# Patient Record
Sex: Female | Born: 1935
Health system: Southern US, Community
[De-identification: ages and names within clinical notes are randomized; demographics above are authoritative.]

## PROBLEM LIST (undated history)

## (undated) DIAGNOSIS — E039 Hypothyroidism, unspecified: Secondary | ICD-10-CM

## (undated) DIAGNOSIS — D649 Anemia, unspecified: Secondary | ICD-10-CM

## (undated) DIAGNOSIS — E7800 Pure hypercholesterolemia, unspecified: Secondary | ICD-10-CM

## (undated) DIAGNOSIS — I10 Essential (primary) hypertension: Secondary | ICD-10-CM

## (undated) DIAGNOSIS — K219 Gastro-esophageal reflux disease without esophagitis: Secondary | ICD-10-CM

## (undated) DIAGNOSIS — M1990 Unspecified osteoarthritis, unspecified site: Secondary | ICD-10-CM

## (undated) HISTORY — DX: Pure hypercholesterolemia, unspecified: E78.00

## (undated) HISTORY — DX: Gastro-esophageal reflux disease without esophagitis: K21.9

## (undated) HISTORY — PX: ABDOMINAL HYSTERECTOMY: SHX81

## (undated) HISTORY — DX: Essential (primary) hypertension: I10

## (undated) HISTORY — DX: Hypothyroidism, unspecified: E03.9

## (undated) HISTORY — DX: Anemia, unspecified: D64.9

---

## 2005-07-23 ENCOUNTER — Other Ambulatory Visit: Payer: Self-pay

## 2005-07-23 ENCOUNTER — Emergency Department: Payer: Self-pay | Admitting: Emergency Medicine

## 2006-03-27 ENCOUNTER — Ambulatory Visit: Payer: Self-pay | Admitting: Gastroenterology

## 2008-04-29 ENCOUNTER — Ambulatory Visit: Payer: Self-pay | Admitting: Family Medicine

## 2008-04-30 ENCOUNTER — Ambulatory Visit: Payer: Self-pay | Admitting: Unknown Physician Specialty

## 2008-04-30 ENCOUNTER — Other Ambulatory Visit: Payer: Self-pay

## 2008-05-02 ENCOUNTER — Ambulatory Visit: Payer: Self-pay | Admitting: Unknown Physician Specialty

## 2010-04-03 HISTORY — PX: LAPAROSCOPIC CHOLECYSTECTOMY: SUR755

## 2010-04-18 ENCOUNTER — Inpatient Hospital Stay: Payer: Self-pay | Admitting: Surgery

## 2010-05-26 ENCOUNTER — Ambulatory Visit: Payer: Self-pay | Admitting: Urology

## 2010-06-03 ENCOUNTER — Ambulatory Visit: Payer: Self-pay | Admitting: Oncology

## 2010-06-10 ENCOUNTER — Ambulatory Visit: Payer: Self-pay | Admitting: Oncology

## 2010-06-14 ENCOUNTER — Ambulatory Visit: Payer: Self-pay | Admitting: Oncology

## 2010-07-04 ENCOUNTER — Ambulatory Visit: Payer: Self-pay | Admitting: Oncology

## 2010-07-05 ENCOUNTER — Ambulatory Visit: Payer: Self-pay | Admitting: Surgery

## 2010-07-12 ENCOUNTER — Ambulatory Visit: Payer: Self-pay | Admitting: Surgery

## 2010-07-22 LAB — PATHOLOGY REPORT

## 2010-07-28 ENCOUNTER — Ambulatory Visit: Payer: Self-pay | Admitting: Oncology

## 2010-08-04 ENCOUNTER — Ambulatory Visit: Payer: Self-pay | Admitting: Oncology

## 2010-09-03 ENCOUNTER — Ambulatory Visit: Payer: Self-pay | Admitting: Oncology

## 2010-09-14 ENCOUNTER — Ambulatory Visit: Payer: Self-pay | Admitting: Oncology

## 2010-09-21 ENCOUNTER — Ambulatory Visit: Payer: Self-pay | Admitting: Oncology

## 2010-10-04 ENCOUNTER — Ambulatory Visit: Payer: Self-pay | Admitting: Oncology

## 2011-03-29 ENCOUNTER — Ambulatory Visit: Payer: Self-pay | Admitting: Oncology

## 2011-04-04 ENCOUNTER — Ambulatory Visit: Payer: Self-pay | Admitting: Oncology

## 2011-08-02 ENCOUNTER — Ambulatory Visit: Payer: Self-pay | Admitting: Oncology

## 2011-08-09 ENCOUNTER — Ambulatory Visit: Payer: Self-pay | Admitting: Oncology

## 2011-09-04 ENCOUNTER — Ambulatory Visit: Payer: Self-pay | Admitting: Oncology

## 2011-12-27 DIAGNOSIS — R7989 Other specified abnormal findings of blood chemistry: Secondary | ICD-10-CM | POA: Diagnosis not present

## 2011-12-27 DIAGNOSIS — R5383 Other fatigue: Secondary | ICD-10-CM | POA: Diagnosis not present

## 2011-12-27 DIAGNOSIS — E039 Hypothyroidism, unspecified: Secondary | ICD-10-CM | POA: Diagnosis not present

## 2011-12-27 DIAGNOSIS — R5381 Other malaise: Secondary | ICD-10-CM | POA: Diagnosis not present

## 2011-12-27 DIAGNOSIS — E7800 Pure hypercholesterolemia, unspecified: Secondary | ICD-10-CM | POA: Diagnosis not present

## 2012-01-03 DIAGNOSIS — E7800 Pure hypercholesterolemia, unspecified: Secondary | ICD-10-CM | POA: Diagnosis not present

## 2012-01-03 DIAGNOSIS — M79609 Pain in unspecified limb: Secondary | ICD-10-CM | POA: Diagnosis not present

## 2012-01-03 DIAGNOSIS — I10 Essential (primary) hypertension: Secondary | ICD-10-CM | POA: Diagnosis not present

## 2012-01-03 DIAGNOSIS — M549 Dorsalgia, unspecified: Secondary | ICD-10-CM | POA: Diagnosis not present

## 2012-06-25 DIAGNOSIS — I10 Essential (primary) hypertension: Secondary | ICD-10-CM | POA: Diagnosis not present

## 2012-06-25 DIAGNOSIS — E7800 Pure hypercholesterolemia, unspecified: Secondary | ICD-10-CM | POA: Diagnosis not present

## 2012-08-09 ENCOUNTER — Emergency Department: Payer: Self-pay | Admitting: Emergency Medicine

## 2012-08-09 DIAGNOSIS — Z79899 Other long term (current) drug therapy: Secondary | ICD-10-CM | POA: Diagnosis not present

## 2012-08-09 DIAGNOSIS — K219 Gastro-esophageal reflux disease without esophagitis: Secondary | ICD-10-CM | POA: Diagnosis not present

## 2012-08-09 DIAGNOSIS — E039 Hypothyroidism, unspecified: Secondary | ICD-10-CM | POA: Diagnosis not present

## 2012-08-09 DIAGNOSIS — I10 Essential (primary) hypertension: Secondary | ICD-10-CM | POA: Diagnosis not present

## 2012-08-09 DIAGNOSIS — M79609 Pain in unspecified limb: Secondary | ICD-10-CM | POA: Diagnosis not present

## 2012-08-09 DIAGNOSIS — M7120 Synovial cyst of popliteal space [Baker], unspecified knee: Secondary | ICD-10-CM | POA: Diagnosis not present

## 2012-08-09 DIAGNOSIS — E785 Hyperlipidemia, unspecified: Secondary | ICD-10-CM | POA: Diagnosis not present

## 2012-08-09 LAB — BASIC METABOLIC PANEL
Anion Gap: 6 — ABNORMAL LOW (ref 7–16)
BUN: 14 mg/dL (ref 7–18)
Co2: 30 mmol/L (ref 21–32)
Creatinine: 0.87 mg/dL (ref 0.60–1.30)
EGFR (African American): >60
EGFR (Non-African Amer.): >60
Glucose: 88 mg/dL (ref 65–99)
Sodium: 140 mmol/L (ref 136–145)

## 2012-08-19 DIAGNOSIS — I10 Essential (primary) hypertension: Secondary | ICD-10-CM | POA: Diagnosis not present

## 2012-08-19 DIAGNOSIS — M1710 Unilateral primary osteoarthritis, unspecified knee: Secondary | ICD-10-CM | POA: Diagnosis not present

## 2012-10-01 ENCOUNTER — Telehealth: Payer: Self-pay | Admitting: Internal Medicine

## 2012-10-01 NOTE — Telephone Encounter (Signed)
I can try to work her in somewhere, but if she is having acute worsening pain - rec to acute care or i can get her referred to Dr Lavenia Atlas (arthritis specialist at [facility]) - to evaluate and then can follow up with me.  Thanks.

## 2012-10-01 NOTE — Telephone Encounter (Signed)
Pt is calling concerning her arhtritis in her hands and back. She is having some really bad pain. I set her up for February but she was wanting to come in as soon as possible.

## 2012-10-02 NOTE — Telephone Encounter (Signed)
Called patient at home. Patient states that she does not want to see Dr. Gavin Potters at this time, she had injection a month ago from him and it did not last long. She wants to see you, if you can fit her in.

## 2012-10-02 NOTE — Telephone Encounter (Signed)
See if she can come in at 12:00 on 10/10/12.  Thanks.

## 2012-10-09 ENCOUNTER — Encounter: Payer: Self-pay | Admitting: *Deleted

## 2012-10-10 ENCOUNTER — Ambulatory Visit (INDEPENDENT_AMBULATORY_CARE_PROVIDER_SITE_OTHER): Payer: Medicare Other | Admitting: Internal Medicine

## 2012-10-10 ENCOUNTER — Encounter: Payer: Self-pay | Admitting: Internal Medicine

## 2012-10-10 VITALS — BP 130/57 | HR 63 | Temp 98.1°F | Ht 63.0 in | Wt 156.0 lb

## 2012-10-10 DIAGNOSIS — E039 Hypothyroidism, unspecified: Secondary | ICD-10-CM

## 2012-10-10 DIAGNOSIS — I10 Essential (primary) hypertension: Secondary | ICD-10-CM | POA: Diagnosis not present

## 2012-10-10 DIAGNOSIS — K219 Gastro-esophageal reflux disease without esophagitis: Secondary | ICD-10-CM

## 2012-10-10 DIAGNOSIS — M25569 Pain in unspecified knee: Secondary | ICD-10-CM | POA: Diagnosis not present

## 2012-10-10 DIAGNOSIS — N2889 Other specified disorders of kidney and ureter: Secondary | ICD-10-CM

## 2012-10-10 DIAGNOSIS — E7800 Pure hypercholesterolemia, unspecified: Secondary | ICD-10-CM

## 2012-10-10 DIAGNOSIS — N289 Disorder of kidney and ureter, unspecified: Secondary | ICD-10-CM

## 2012-10-10 DIAGNOSIS — R300 Dysuria: Secondary | ICD-10-CM

## 2012-10-10 DIAGNOSIS — R5383 Other fatigue: Secondary | ICD-10-CM

## 2012-10-10 LAB — POCT URINALYSIS DIPSTICK
Bilirubin, UA: NEGATIVE
Blood, UA: NEGATIVE
Glucose, UA: NEGATIVE
Ketones, UA: NEGATIVE
pH, UA: 5.5

## 2012-10-10 MED ORDER — NYSTATIN 100000 UNIT/GM EX CREA: TOPICAL_CREAM | Freq: Two times a day (BID) | CUTANEOUS | Status: DC

## 2012-10-10 MED ORDER — LEVOTHYROXINE SODIUM 75 MCG PO TABS: 75.0000 ug | ORAL_TABLET | Freq: Every day | ORAL | Status: DC

## 2012-10-10 MED ORDER — PANTOPRAZOLE SODIUM 40 MG PO TBEC: 40.0000 mg | DELAYED_RELEASE_TABLET | Freq: Every day | ORAL | Status: DC

## 2012-10-10 NOTE — Patient Instructions (Addendum)
It was good seeing you today.  I am sorry you have not been feeling well.  We will get an appt with orthopedics.  Also, we will schedule follow up labs.

## 2012-10-11 ENCOUNTER — Telehealth: Payer: Self-pay | Admitting: *Deleted

## 2012-10-11 ENCOUNTER — Other Ambulatory Visit: Payer: Self-pay | Admitting: Internal Medicine

## 2012-10-11 DIAGNOSIS — N390 Urinary tract infection, site not specified: Secondary | ICD-10-CM

## 2012-10-11 NOTE — Telephone Encounter (Signed)
Called in Script for [employer]

## 2012-10-24 ENCOUNTER — Other Ambulatory Visit (INDEPENDENT_AMBULATORY_CARE_PROVIDER_SITE_OTHER): Payer: Medicare Other

## 2012-10-24 DIAGNOSIS — E039 Hypothyroidism, unspecified: Secondary | ICD-10-CM

## 2012-10-24 DIAGNOSIS — I10 Essential (primary) hypertension: Secondary | ICD-10-CM | POA: Diagnosis not present

## 2012-10-24 DIAGNOSIS — E7800 Pure hypercholesterolemia, unspecified: Secondary | ICD-10-CM

## 2012-10-24 DIAGNOSIS — R5383 Other fatigue: Secondary | ICD-10-CM

## 2012-10-24 DIAGNOSIS — N390 Urinary tract infection, site not specified: Secondary | ICD-10-CM

## 2012-10-24 DIAGNOSIS — R5381 Other malaise: Secondary | ICD-10-CM | POA: Diagnosis not present

## 2012-10-24 LAB — CBC WITH DIFFERENTIAL/PLATELET
Basophils Relative: 0.8 % (ref 0.0–3.0)
Eosinophils Relative: 1.9 % (ref 0.0–5.0)
HCT: 37.5 % (ref 36.0–46.0)
Lymphs Abs: 1.4 10*3/uL (ref 0.7–4.0)
MCV: 94.1 fl (ref 78.0–100.0)
Monocytes Absolute: 0.6 10*3/uL (ref 0.1–1.0)
Neutro Abs: 4.5 10*3/uL (ref 1.4–7.7)
RBC: 3.98 Mil/uL (ref 3.87–5.11)
WBC: 6.6 10*3/uL (ref 4.5–10.5)

## 2012-10-24 LAB — URINALYSIS, ROUTINE W REFLEX MICROSCOPIC
Hgb urine dipstick: NEGATIVE
Nitrite: NEGATIVE
Specific Gravity, Urine: 1.015 (ref 1.000–1.030)
Total Protein, Urine: NEGATIVE
pH: 7 (ref 5.0–8.0)

## 2012-10-24 LAB — BASIC METABOLIC PANEL
BUN: 13 mg/dL (ref 6–23)
Calcium: 9 mg/dL (ref 8.4–10.5)
GFR: 85.13 mL/min (ref >60.00)
Glucose, Bld: 103 mg/dL — ABNORMAL HIGH (ref 70–99)
Sodium: 138 mEq/L (ref 135–145)

## 2012-10-24 LAB — TSH: TSH: 4.05 u[IU]/mL (ref 0.35–5.50)

## 2012-10-27 ENCOUNTER — Encounter: Payer: Self-pay | Admitting: Internal Medicine

## 2012-10-27 DIAGNOSIS — I10 Essential (primary) hypertension: Secondary | ICD-10-CM | POA: Insufficient documentation

## 2012-10-27 DIAGNOSIS — E7800 Pure hypercholesterolemia, unspecified: Secondary | ICD-10-CM | POA: Insufficient documentation

## 2012-10-27 DIAGNOSIS — N2889 Other specified disorders of kidney and ureter: Secondary | ICD-10-CM | POA: Insufficient documentation

## 2012-10-27 DIAGNOSIS — K219 Gastro-esophageal reflux disease without esophagitis: Secondary | ICD-10-CM | POA: Insufficient documentation

## 2012-10-27 DIAGNOSIS — E039 Hypothyroidism, unspecified: Secondary | ICD-10-CM | POA: Insufficient documentation

## 2012-10-27 DIAGNOSIS — N399 Disorder of urinary system, unspecified: Secondary | ICD-10-CM | POA: Insufficient documentation

## 2012-10-27 NOTE — Progress Notes (Signed)
Subjective:    Patient ID: Tasha Avila, female    DOB: 12-02-1928, 78 y.o.   MRN: [id number]  HPI 78 year old female with past history of hypertension, anemia, hypercholesterolemia and hypothyroidism who is s/p lap cholecystectomy 5/11.  A renal mass was found incedentally on the ultrasound.  Subsequent CT revealed increased lymphadenopathy.  She underwent bx -negative.  Sill concern regarding possible lymphoma.  She comes in today for a scheduled follow up.  She states she went to the ER for knee/leg pain.  Ultrasound revealed a Baker's Cyst.  Takes tylenol intermittently.  No increased erythema or warmth.  States she is eating and drinking relatively well.  Still with some occasional acid reflux, but is better.  Still with increased pain in her back and shoulder.    Past Medical History  Diagnosis Date  . Anemia   . Hypothyroidism   . Hypercholesterolemia   . Hypertension   . GERD (gastroesophageal reflux disease)     Outpatient Encounter Prescriptions as of 10/10/2012  Medication Sig Dispense Refill  . levothyroxine (SYNTHROID, LEVOTHROID) 75 MCG tablet Take 1 tablet (75 mcg total) by mouth daily.  90 tablet  3  . lisinopril-hydrochlorothiazide (PRINZIDE,ZESTORETIC) 10-12.5 MG per tablet Take 1 tablet by mouth daily.      . nitrofurantoin, macrocrystal-monohydrate, (MACROBID) 100 MG capsule Take 100 mg by mouth 2 (two) times daily.      Marland Kitchen nystatin cream (MYCOSTATIN) Apply topically 2 (two) times daily.  30 g  0  . pantoprazole (PROTONIX) 40 MG tablet Take 1 tablet (40 mg total) by mouth daily.  30 tablet  3  . [DISCONTINUED] levothyroxine (SYNTHROID, LEVOTHROID) 75 MCG tablet Take 75 mcg by mouth daily.      . [DISCONTINUED] omeprazole (PRILOSEC) 20 MG capsule Take 20 mg by mouth daily.        Review of Systems Patient denies any headache.  No significant sinus symptoms currently.   No chest pain, tightness or palpitations.  No increased shortness of breath, cough or congestion.   Breathing stable.  No nausea or vomiting.  No abdominal pain or cramping.  Bowels stable.  She is concerned she may have a uti.  Some increased frequency and mild dysuria.      Objective:   Physical Exam Filed Vitals:   10/10/12 1202  BP: 130/57  Pulse: 63  Temp: 98.1 F (40.61 C)   78 year old female in no acute distress.   HEENT:  Nares - clear.  OP- without lesions or erythema.  NECK:  Supple, nontender.   HEART:  Appears to be regular. LUNGS:  Without crackles or wheezing audible.  Respirations even and unlabored.   RADIAL PULSE:  Equal bilaterally.  ABDOMEN:  Soft, nontender.  No audible abdominal bruit.   EXTREMITIES:  No increased edema to be present.  Minimal tenderness to palpation over the popliteal region.  No increased erythema or warmth.                     Assessment & Plan:  KNEE PAIN/LEG PAIN.  Previous ultrasound revealed a Bakers cyst.  Given persistent problems, will refer to ortho for evaluation and further treatment.  Continue tylenol prn.   MSK.  Has persistent back pain.  Declines any further w/up.  Follow.  Tylenol prn.   POSSIBLE UTI.  Urine dip consistent with a uti.  Treat with macrobid as directed.    DERMATOLOGY.  Persistent perirectal and perivaginal irritation.  Nystatin cream as  directed.  Follow.   HEALTH MAINTENANCE.  Physical 01/03/12.  She is s/p hysterectomy.  Declines further GI evaluatoin., bone density and mammogram.

## 2012-10-27 NOTE — Assessment & Plan Note (Signed)
Found incidentally.  Evaluated by Urology and Dr Choksi.  CT revealed lymphadenopathy.  Continue follow up with Dr Choksi.    

## 2012-10-27 NOTE — Assessment & Plan Note (Signed)
Has been a persistent problem for her.  Has improved.  Has seen GI.  Not on Reglan now.  Declines further evaluation.

## 2012-10-27 NOTE — Assessment & Plan Note (Signed)
Low cholesterol diet.  Will follow.  Off simvastatin.  Check lipid panel with next labs.

## 2012-10-27 NOTE — Assessment & Plan Note (Signed)
On thyroid replacement.  Follow tsh.  

## 2012-10-27 NOTE — Assessment & Plan Note (Signed)
Blood pressure has been under good control.  Same meds.  Check metabolic panel with next labs.    

## 2012-10-28 ENCOUNTER — Telehealth: Payer: Self-pay | Admitting: *Deleted

## 2012-10-28 ENCOUNTER — Other Ambulatory Visit: Payer: Self-pay | Admitting: Internal Medicine

## 2012-10-28 ENCOUNTER — Encounter: Payer: Self-pay | Admitting: *Deleted

## 2012-10-28 NOTE — Telephone Encounter (Signed)
Patient informed of positive test results.Cipro 500 mg bid for 5 days left on vm @ [facility] in [place]. Patient has NKDA

## 2012-10-28 NOTE — Progress Notes (Signed)
Opened in error

## 2012-12-02 ENCOUNTER — Other Ambulatory Visit: Payer: Self-pay | Admitting: *Deleted

## 2012-12-02 ENCOUNTER — Telehealth: Payer: Self-pay | Admitting: Internal Medicine

## 2012-12-02 MED ORDER — LISINOPRIL-HYDROCHLOROTHIAZIDE 10-12.5 MG PO TABS: 1.0000 | ORAL_TABLET | Freq: Every day | ORAL | Status: DC

## 2012-12-02 NOTE — Telephone Encounter (Signed)
Refilled patients script for lisinopril/Hctz 10-12.5 mg

## 2012-12-02 NOTE — Telephone Encounter (Signed)
Lisino -HCTZ 10-12.5  Mg tab  Take one tablet by mouth every day  # 30

## 2012-12-12 DIAGNOSIS — M1710 Unilateral primary osteoarthritis, unspecified knee: Secondary | ICD-10-CM | POA: Diagnosis not present

## 2012-12-31 DIAGNOSIS — M1710 Unilateral primary osteoarthritis, unspecified knee: Secondary | ICD-10-CM | POA: Diagnosis not present

## 2013-01-07 DIAGNOSIS — M1710 Unilateral primary osteoarthritis, unspecified knee: Secondary | ICD-10-CM | POA: Diagnosis not present

## 2013-01-10 ENCOUNTER — Encounter: Payer: Self-pay | Admitting: Internal Medicine

## 2013-01-10 ENCOUNTER — Ambulatory Visit (INDEPENDENT_AMBULATORY_CARE_PROVIDER_SITE_OTHER): Payer: Medicare Other | Admitting: Internal Medicine

## 2013-01-10 VITALS — BP 112/60 | HR 59 | Temp 97.7°F | Ht 63.0 in | Wt 156.0 lb

## 2013-01-10 DIAGNOSIS — I10 Essential (primary) hypertension: Secondary | ICD-10-CM | POA: Diagnosis not present

## 2013-01-10 DIAGNOSIS — N289 Disorder of kidney and ureter, unspecified: Secondary | ICD-10-CM

## 2013-01-10 DIAGNOSIS — R220 Localized swelling, mass and lump, head: Secondary | ICD-10-CM

## 2013-01-10 DIAGNOSIS — K219 Gastro-esophageal reflux disease without esophagitis: Secondary | ICD-10-CM

## 2013-01-10 DIAGNOSIS — E7800 Pure hypercholesterolemia, unspecified: Secondary | ICD-10-CM | POA: Diagnosis not present

## 2013-01-10 DIAGNOSIS — N2889 Other specified disorders of kidney and ureter: Secondary | ICD-10-CM

## 2013-01-10 DIAGNOSIS — E039 Hypothyroidism, unspecified: Secondary | ICD-10-CM | POA: Diagnosis not present

## 2013-01-10 DIAGNOSIS — R221 Localized swelling, mass and lump, neck: Secondary | ICD-10-CM

## 2013-01-12 ENCOUNTER — Encounter: Payer: Self-pay | Admitting: Internal Medicine

## 2013-01-12 NOTE — Assessment & Plan Note (Signed)
Some intermittent break through symptoms.  Declines any further evaluation and w/up.  Follow.

## 2013-01-12 NOTE — Assessment & Plan Note (Signed)
See above.  Incidentally found.  Concern regarding lymphoma.  Pt states everything checked out fine.  Obtain records.

## 2013-01-12 NOTE — Progress Notes (Signed)
Subjective:    Patient ID: Tasha Avila, female    DOB: 1928-09-02, 79 y.o.   MRN: [id number]  HPI 79 year old female with past history of hypertension, anemia, hypercholesterolemia and hypothyroidism who is s/p lap cholecystectomy 5/11.  A renal mass was found incedentally on the ultrasound.  Subsequent CT revealed increased lymphadenopathy.  She underwent bx -negative.  Sill concern regarding possible lymphoma.  She comes in today to follow up on these issues as well as for a complete physical exam.  She saw Dr Ernest Pine for her leg.  Diagnosed with arthritis.  Uses a topical medication - which helps some.  No chest pain or tightness.  Breathing stable.  No nausea or vomiting. No bowel change.    Past Medical History  Diagnosis Date  . Anemia   . Hypothyroidism   . Hypercholesterolemia   . Hypertension   . GERD (gastroesophageal reflux disease)     Outpatient Encounter Prescriptions as of 01/10/2013  Medication Sig Dispense Refill  . Homeopathic Products ([employer] LEG CRAMPS PAIN RELIEF PO) Take by mouth as needed.      Marland Kitchen levothyroxine (SYNTHROID, LEVOTHROID) 75 MCG tablet Take 1 tablet (75 mcg total) by mouth daily.  90 tablet  3  . lisinopril-hydrochlorothiazide (PRINZIDE,ZESTORETIC) 10-12.5 MG per tablet Take 1 tablet by mouth daily.  60 tablet  1  . nystatin cream (MYCOSTATIN) Apply topically 2 (two) times daily.  30 g  0  . omeprazole (PRILOSEC) 20 MG capsule Take 20 mg by mouth daily.      . pantoprazole (PROTONIX) 40 MG tablet Take 1 tablet (40 mg total) by mouth daily.  30 tablet  3  . nitrofurantoin, macrocrystal-monohydrate, (MACROBID) 100 MG capsule Take 100 mg by mouth 2 (two) times daily.       No facility-administered encounter medications on file as of 01/10/2013.    Review of Systems Patient denies any headache.  No significant sinus symptoms currently.   No chest pain, tightness or palpitations.  No increased shortness of breath, cough or congestion.  Breathing stable.  No  nausea or vomiting.  No abdominal pain or cramping.  Bowels stable.  No urinary problems now. Some persistent leg discomfort.  The topical medication - helps.        Objective:   Physical Exam  Filed Vitals:   01/10/13 1444  BP: 112/60  Pulse: 59  Temp: 97.7 F (34.75 C)   79 year old female in no acute distress.   HEENT:  Nares- clear.  Oropharynx - without lesions. NECK:  Supple.  Nontender.  No audible bruit.  Palpable right neck nodule.  HEART:  Appears to be regular. LUNGS:  No crackles or wheezing audible.  Respirations even and unlabored.  RADIAL PULSE:  Equal bilaterally.    BREASTS:  No nipple discharge or nipple retraction present.  Could not appreciate any distinct nodules or axillary adenopathy.  ABDOMEN:  Soft, nontender.  Bowel sounds present and normal.  No audible abdominal bruit.  GU:  Normal external genitalia.  Vaginal vault without lesions.  S/P hysterectomy.  Could not appreciate any adnexal masses or tenderness.   RECTAL:  Heme negative.   EXTREMITIES:  No increased edema present.  DP pulses palpable and equal bilaterally.             Assessment & Plan:  KNEE PAIN/LEG PAIN.  Previous ultrasound revealed a Bakers cyst.  Given persistent problems was referred to Dr Ernest Pine.  Saw Dr Ernest Pine.  Diagnosed with arthritis.  Stable.   Follow.   NECK NODULE.  Will refer to ENT for evaluation.  Has the history of the renal mass.  Concern regarding lymphoma - see above.  Refer to ENT for evaluation.    MSK.  Has persistent back pain.  Declines any further w/up.  Follow.  Tylenol prn.    DERMATOLOGY.  Persistent intermittent perirectal and perivaginal irritation.  Nystatin cream as needed.  Improved currently.     HEALTH MAINTENANCE.  Physical today.  She is s/p hysterectomy.  Declines further GI evaluatoin., bone density and mammogram.

## 2013-01-12 NOTE — Assessment & Plan Note (Signed)
Blood pressure is under good control. Same medication regimen.  Check metabolic panel.  

## 2013-01-12 NOTE — Assessment & Plan Note (Signed)
Low cholesterol diet.  Check lipid panel with next fasting labs.  

## 2013-01-12 NOTE — Assessment & Plan Note (Signed)
On replacement.  Check tsh.    

## 2013-01-14 DIAGNOSIS — M1710 Unilateral primary osteoarthritis, unspecified knee: Secondary | ICD-10-CM | POA: Diagnosis not present

## 2013-01-14 DIAGNOSIS — R221 Localized swelling, mass and lump, neck: Secondary | ICD-10-CM | POA: Diagnosis not present

## 2013-01-14 DIAGNOSIS — H6120 Impacted cerumen, unspecified ear: Secondary | ICD-10-CM | POA: Diagnosis not present

## 2013-01-20 DIAGNOSIS — R221 Localized swelling, mass and lump, neck: Secondary | ICD-10-CM | POA: Diagnosis not present

## 2013-01-20 DIAGNOSIS — R599 Enlarged lymph nodes, unspecified: Secondary | ICD-10-CM | POA: Diagnosis not present

## 2013-01-20 DIAGNOSIS — C8589 Other specified types of non-Hodgkin lymphoma, extranodal and solid organ sites: Secondary | ICD-10-CM | POA: Diagnosis not present

## 2013-01-20 DIAGNOSIS — R220 Localized swelling, mass and lump, head: Secondary | ICD-10-CM | POA: Diagnosis not present

## 2013-01-30 ENCOUNTER — Ambulatory Visit: Payer: Self-pay | Admitting: Oncology

## 2013-02-01 ENCOUNTER — Telehealth: Payer: Self-pay | Admitting: Internal Medicine

## 2013-02-01 MED ORDER — LISINOPRIL-HYDROCHLOROTHIAZIDE 10-12.5 MG PO TABS: 1.0000 | ORAL_TABLET | Freq: Every day | ORAL | Status: DC

## 2013-02-01 NOTE — Telephone Encounter (Signed)
Refilled lisinopril/hctz 10/12.5 (#60 with 3 refills)

## 2013-02-10 ENCOUNTER — Telehealth: Payer: Self-pay | Admitting: Internal Medicine

## 2013-02-10 MED ORDER — PANTOPRAZOLE SODIUM 40 MG PO TBEC: 40.0000 mg | DELAYED_RELEASE_TABLET | Freq: Every day | ORAL | Status: DC

## 2013-02-10 NOTE — Telephone Encounter (Signed)
Refilled protonix x 6

## 2013-02-14 ENCOUNTER — Ambulatory Visit: Payer: Self-pay | Admitting: Oncology

## 2013-02-14 DIAGNOSIS — R5381 Other malaise: Secondary | ICD-10-CM | POA: Diagnosis not present

## 2013-02-14 DIAGNOSIS — I10 Essential (primary) hypertension: Secondary | ICD-10-CM | POA: Diagnosis not present

## 2013-02-14 DIAGNOSIS — C8311 Mantle cell lymphoma, lymph nodes of head, face, and neck: Secondary | ICD-10-CM | POA: Diagnosis not present

## 2013-02-14 DIAGNOSIS — E039 Hypothyroidism, unspecified: Secondary | ICD-10-CM | POA: Diagnosis not present

## 2013-02-14 DIAGNOSIS — K219 Gastro-esophageal reflux disease without esophagitis: Secondary | ICD-10-CM | POA: Diagnosis not present

## 2013-02-14 DIAGNOSIS — E785 Hyperlipidemia, unspecified: Secondary | ICD-10-CM | POA: Diagnosis not present

## 2013-02-14 DIAGNOSIS — H9190 Unspecified hearing loss, unspecified ear: Secondary | ICD-10-CM | POA: Diagnosis not present

## 2013-02-14 DIAGNOSIS — G510 Bell's palsy: Secondary | ICD-10-CM | POA: Diagnosis not present

## 2013-02-14 DIAGNOSIS — M549 Dorsalgia, unspecified: Secondary | ICD-10-CM | POA: Diagnosis not present

## 2013-02-14 DIAGNOSIS — G8929 Other chronic pain: Secondary | ICD-10-CM | POA: Diagnosis not present

## 2013-02-14 LAB — COMPREHENSIVE METABOLIC PANEL
Albumin: 4 g/dL (ref 3.4–5.0)
Alkaline Phosphatase: 65 U/L (ref 50–136)
Anion Gap: 8 (ref 7–16)
BUN: 11 mg/dL (ref 7–18)
Bilirubin,Total: 0.3 mg/dL (ref 0.2–1.0)
Calcium, Total: 9.1 mg/dL (ref 8.5–10.1)
Chloride: 102 mmol/L (ref 98–107)
Co2: 30 mmol/L (ref 21–32)
Creatinine: 0.96 mg/dL (ref 0.60–1.30)
EGFR (African American): >60
EGFR (Non-African Amer.): 55 — ABNORMAL LOW
Glucose: 108 mg/dL — ABNORMAL HIGH (ref 65–99)
Osmolality: 279 (ref 275–301)
Potassium: 4 mmol/L (ref 3.5–5.1)
SGOT(AST): 21 U/L (ref 15–37)
SGPT (ALT): 26 U/L (ref 12–78)
Sodium: 140 mmol/L (ref 136–145)
Total Protein: 7.6 g/dL (ref 6.4–8.2)

## 2013-02-14 LAB — CBC CANCER CENTER
Basophil #: 0.1 x10 3/mm (ref 0.0–0.1)
Eosinophil #: 0.1 x10 3/mm (ref 0.0–0.7)
HCT: 40.1 % (ref 35.0–47.0)
HGB: 12.9 g/dL (ref 12.0–16.0)
MCH: 30 pg (ref 26.0–34.0)
MCHC: 32.1 g/dL (ref 32.0–36.0)
MCV: 94 fL (ref 80–100)
Monocyte %: 9.4 %
Neutrophil #: 5.6 x10 3/mm (ref 1.4–6.5)
Platelet: 300 x10 3/mm (ref 150–440)
RBC: 4.29 10*6/uL (ref 3.80–5.20)
RDW: 14 % (ref 11.5–14.5)
WBC: 7.9 x10 3/mm (ref 3.6–11.0)

## 2013-02-14 LAB — LACTATE DEHYDROGENASE: LDH: 206 U/L (ref 81–246)

## 2013-02-19 DIAGNOSIS — R221 Localized swelling, mass and lump, neck: Secondary | ICD-10-CM | POA: Diagnosis not present

## 2013-02-19 DIAGNOSIS — R220 Localized swelling, mass and lump, head: Secondary | ICD-10-CM | POA: Diagnosis not present

## 2013-02-28 ENCOUNTER — Ambulatory Visit: Payer: Self-pay | Admitting: Unknown Physician Specialty

## 2013-02-28 DIAGNOSIS — C8311 Mantle cell lymphoma, lymph nodes of head, face, and neck: Secondary | ICD-10-CM | POA: Diagnosis not present

## 2013-02-28 DIAGNOSIS — I10 Essential (primary) hypertension: Secondary | ICD-10-CM | POA: Diagnosis not present

## 2013-02-28 DIAGNOSIS — Z79899 Other long term (current) drug therapy: Secondary | ICD-10-CM | POA: Diagnosis not present

## 2013-02-28 DIAGNOSIS — C8291 Follicular lymphoma, unspecified, lymph nodes of head, face, and neck: Secondary | ICD-10-CM | POA: Diagnosis not present

## 2013-02-28 DIAGNOSIS — K219 Gastro-esophageal reflux disease without esophagitis: Secondary | ICD-10-CM | POA: Diagnosis not present

## 2013-02-28 DIAGNOSIS — J00 Acute nasopharyngitis [common cold]: Secondary | ICD-10-CM | POA: Diagnosis not present

## 2013-02-28 DIAGNOSIS — R220 Localized swelling, mass and lump, head: Secondary | ICD-10-CM | POA: Diagnosis not present

## 2013-02-28 DIAGNOSIS — C969 Malignant neoplasm of lymphoid, hematopoietic and related tissue, unspecified: Secondary | ICD-10-CM | POA: Diagnosis not present

## 2013-02-28 DIAGNOSIS — R599 Enlarged lymph nodes, unspecified: Secondary | ICD-10-CM | POA: Diagnosis not present

## 2013-02-28 DIAGNOSIS — M19019 Primary osteoarthritis, unspecified shoulder: Secondary | ICD-10-CM | POA: Diagnosis not present

## 2013-02-28 DIAGNOSIS — M19029 Primary osteoarthritis, unspecified elbow: Secondary | ICD-10-CM | POA: Diagnosis not present

## 2013-02-28 DIAGNOSIS — M479 Spondylosis, unspecified: Secondary | ICD-10-CM | POA: Diagnosis not present

## 2013-02-28 DIAGNOSIS — R221 Localized swelling, mass and lump, neck: Secondary | ICD-10-CM | POA: Diagnosis not present

## 2013-02-28 DIAGNOSIS — E079 Disorder of thyroid, unspecified: Secondary | ICD-10-CM | POA: Diagnosis not present

## 2013-02-28 DIAGNOSIS — R252 Cramp and spasm: Secondary | ICD-10-CM | POA: Diagnosis not present

## 2013-02-28 DIAGNOSIS — M19049 Primary osteoarthritis, unspecified hand: Secondary | ICD-10-CM | POA: Diagnosis not present

## 2013-03-04 ENCOUNTER — Ambulatory Visit: Payer: Self-pay | Admitting: Oncology

## 2013-03-04 DIAGNOSIS — R1310 Dysphagia, unspecified: Secondary | ICD-10-CM | POA: Diagnosis not present

## 2013-03-04 DIAGNOSIS — Z5111 Encounter for antineoplastic chemotherapy: Secondary | ICD-10-CM | POA: Diagnosis not present

## 2013-03-04 DIAGNOSIS — G8929 Other chronic pain: Secondary | ICD-10-CM | POA: Diagnosis not present

## 2013-03-04 DIAGNOSIS — K219 Gastro-esophageal reflux disease without esophagitis: Secondary | ICD-10-CM | POA: Diagnosis not present

## 2013-03-04 DIAGNOSIS — M549 Dorsalgia, unspecified: Secondary | ICD-10-CM | POA: Diagnosis not present

## 2013-03-04 DIAGNOSIS — Z79899 Other long term (current) drug therapy: Secondary | ICD-10-CM | POA: Diagnosis not present

## 2013-03-04 DIAGNOSIS — E785 Hyperlipidemia, unspecified: Secondary | ICD-10-CM | POA: Diagnosis not present

## 2013-03-04 DIAGNOSIS — C8298 Follicular lymphoma, unspecified, lymph nodes of multiple sites: Secondary | ICD-10-CM | POA: Diagnosis not present

## 2013-03-04 DIAGNOSIS — E039 Hypothyroidism, unspecified: Secondary | ICD-10-CM | POA: Diagnosis not present

## 2013-03-04 DIAGNOSIS — I10 Essential (primary) hypertension: Secondary | ICD-10-CM | POA: Diagnosis not present

## 2013-03-04 DIAGNOSIS — G893 Neoplasm related pain (acute) (chronic): Secondary | ICD-10-CM | POA: Diagnosis not present

## 2013-03-04 DIAGNOSIS — M129 Arthropathy, unspecified: Secondary | ICD-10-CM | POA: Diagnosis not present

## 2013-03-14 DIAGNOSIS — C8298 Follicular lymphoma, unspecified, lymph nodes of multiple sites: Secondary | ICD-10-CM | POA: Diagnosis not present

## 2013-03-28 DIAGNOSIS — G893 Neoplasm related pain (acute) (chronic): Secondary | ICD-10-CM | POA: Diagnosis not present

## 2013-03-28 DIAGNOSIS — C8298 Follicular lymphoma, unspecified, lymph nodes of multiple sites: Secondary | ICD-10-CM | POA: Diagnosis not present

## 2013-03-28 LAB — COMPREHENSIVE METABOLIC PANEL
Albumin: 3.6 g/dL (ref 3.4–5.0)
Anion Gap: 8 (ref 7–16)
BUN: 16 mg/dL (ref 7–18)
Bilirubin,Total: 0.5 mg/dL (ref 0.2–1.0)
Calcium, Total: 8.4 mg/dL — ABNORMAL LOW (ref 8.5–10.1)
Chloride: 105 mmol/L (ref 98–107)
Creatinine: 0.84 mg/dL (ref 0.60–1.30)
EGFR (African American): >60
Potassium: 4.1 mmol/L (ref 3.5–5.1)
SGOT(AST): 18 U/L (ref 15–37)
SGPT (ALT): 23 U/L (ref 12–78)
Total Protein: 6.9 g/dL (ref 6.4–8.2)

## 2013-03-28 LAB — CBC CANCER CENTER
Basophil %: 1 %
Eosinophil %: 2.5 %
HGB: 12.5 g/dL (ref 12.0–16.0)
Lymphocyte %: 22.2 %
MCHC: 33 g/dL (ref 32.0–36.0)
MCV: 92 fL (ref 80–100)
Monocyte %: 10.3 %
Neutrophil %: 64 %
RBC: 4.09 10*6/uL (ref 3.80–5.20)

## 2013-03-28 LAB — LACTATE DEHYDROGENASE: LDH: 170 U/L (ref 81–246)

## 2013-04-03 ENCOUNTER — Ambulatory Visit: Payer: Self-pay | Admitting: Oncology

## 2013-04-03 DIAGNOSIS — Z79899 Other long term (current) drug therapy: Secondary | ICD-10-CM | POA: Diagnosis not present

## 2013-04-03 DIAGNOSIS — I10 Essential (primary) hypertension: Secondary | ICD-10-CM | POA: Diagnosis not present

## 2013-04-03 DIAGNOSIS — G8929 Other chronic pain: Secondary | ICD-10-CM | POA: Diagnosis not present

## 2013-04-03 DIAGNOSIS — C8311 Mantle cell lymphoma, lymph nodes of head, face, and neck: Secondary | ICD-10-CM | POA: Diagnosis not present

## 2013-04-03 DIAGNOSIS — Z5111 Encounter for antineoplastic chemotherapy: Secondary | ICD-10-CM | POA: Diagnosis not present

## 2013-04-03 DIAGNOSIS — E039 Hypothyroidism, unspecified: Secondary | ICD-10-CM | POA: Diagnosis not present

## 2013-04-03 DIAGNOSIS — E785 Hyperlipidemia, unspecified: Secondary | ICD-10-CM | POA: Diagnosis not present

## 2013-04-03 DIAGNOSIS — M549 Dorsalgia, unspecified: Secondary | ICD-10-CM | POA: Diagnosis not present

## 2013-04-03 DIAGNOSIS — K219 Gastro-esophageal reflux disease without esophagitis: Secondary | ICD-10-CM | POA: Diagnosis not present

## 2013-04-04 LAB — CBC CANCER CENTER
Basophil #: 0.1 x10 3/mm (ref 0.0–0.1)
Eosinophil #: 0.1 x10 3/mm (ref 0.0–0.7)
Eosinophil %: 1.8 %
HGB: 12.1 g/dL (ref 12.0–16.0)
Lymphocyte #: 0.9 x10 3/mm — ABNORMAL LOW (ref 1.0–3.6)
Lymphocyte %: 12 %
MCH: 29.7 pg (ref 26.0–34.0)
MCV: 91 fL (ref 80–100)
Neutrophil %: 76.2 %
Platelet: 255 x10 3/mm (ref 150–440)
RDW: 13.9 % (ref 11.5–14.5)
WBC: 7.9 x10 3/mm (ref 3.6–11.0)

## 2013-04-08 ENCOUNTER — Other Ambulatory Visit: Payer: Self-pay | Admitting: *Deleted

## 2013-04-08 NOTE — Telephone Encounter (Signed)
Pharmacy Note:  We have changes mfg from Mylan to Sandoz if ok no action needed

## 2013-04-08 NOTE — Telephone Encounter (Signed)
Spoke with pharmacist & told them it was okay for pt to continue the Levothryoxine dispite the change in manufacturer

## 2013-04-11 DIAGNOSIS — C8311 Mantle cell lymphoma, lymph nodes of head, face, and neck: Secondary | ICD-10-CM | POA: Diagnosis not present

## 2013-04-11 LAB — CBC CANCER CENTER
Basophil #: 0.1 x10 3/mm (ref 0.0–0.1)
Basophil %: 1.1 %
Eosinophil #: 0 x10 3/mm (ref 0.0–0.7)
Eosinophil %: 0.8 %
HCT: 38.7 % (ref 35.0–47.0)
HGB: 12.8 g/dL (ref 12.0–16.0)
MCH: 30.3 pg (ref 26.0–34.0)
MCV: 91 fL (ref 80–100)
Monocyte %: 24 %
Neutrophil %: 55.3 %
Platelet: 232 x10 3/mm (ref 150–440)
RBC: 4.23 10*6/uL (ref 3.80–5.20)
WBC: 5.9 x10 3/mm (ref 3.6–11.0)

## 2013-04-17 DIAGNOSIS — R05 Cough: Secondary | ICD-10-CM | POA: Diagnosis not present

## 2013-04-17 DIAGNOSIS — J209 Acute bronchitis, unspecified: Secondary | ICD-10-CM | POA: Diagnosis not present

## 2013-04-17 DIAGNOSIS — C969 Malignant neoplasm of lymphoid, hematopoietic and related tissue, unspecified: Secondary | ICD-10-CM | POA: Diagnosis not present

## 2013-04-18 LAB — CBC CANCER CENTER
Basophil #: 0 x10 3/mm (ref 0.0–0.1)
Eosinophil #: 0.1 x10 3/mm (ref 0.0–0.7)
HGB: 12.2 g/dL (ref 12.0–16.0)
Lymphocyte #: 1.1 x10 3/mm (ref 1.0–3.6)
MCHC: 32.7 g/dL (ref 32.0–36.0)
Monocyte #: 0.7 x10 3/mm (ref 0.2–0.9)
Monocyte %: 11.8 %
Neutrophil %: 65.6 %
Platelet: 220 x10 3/mm (ref 150–440)
RBC: 4.07 10*6/uL (ref 3.80–5.20)
RDW: 13.7 % (ref 11.5–14.5)
WBC: 5.6 x10 3/mm (ref 3.6–11.0)

## 2013-05-04 ENCOUNTER — Ambulatory Visit: Payer: Self-pay | Admitting: Oncology

## 2013-05-16 DIAGNOSIS — H25019 Cortical age-related cataract, unspecified eye: Secondary | ICD-10-CM | POA: Diagnosis not present

## 2013-07-09 ENCOUNTER — Ambulatory Visit: Payer: Self-pay | Admitting: Oncology

## 2013-07-09 DIAGNOSIS — C8298 Follicular lymphoma, unspecified, lymph nodes of multiple sites: Secondary | ICD-10-CM | POA: Diagnosis not present

## 2013-07-09 DIAGNOSIS — C8589 Other specified types of non-Hodgkin lymphoma, extranodal and solid organ sites: Secondary | ICD-10-CM | POA: Diagnosis not present

## 2013-07-10 ENCOUNTER — Encounter: Payer: Self-pay | Admitting: Internal Medicine

## 2013-07-10 ENCOUNTER — Ambulatory Visit (INDEPENDENT_AMBULATORY_CARE_PROVIDER_SITE_OTHER): Payer: Medicare Other | Admitting: Internal Medicine

## 2013-07-10 VITALS — BP 120/78 | HR 58 | Temp 98.1°F | Ht 63.0 in | Wt 160.2 lb

## 2013-07-10 DIAGNOSIS — C8589 Other specified types of non-Hodgkin lymphoma, extranodal and solid organ sites: Secondary | ICD-10-CM

## 2013-07-10 DIAGNOSIS — I10 Essential (primary) hypertension: Secondary | ICD-10-CM

## 2013-07-10 DIAGNOSIS — K219 Gastro-esophageal reflux disease without esophagitis: Secondary | ICD-10-CM

## 2013-07-10 DIAGNOSIS — E7800 Pure hypercholesterolemia, unspecified: Secondary | ICD-10-CM | POA: Diagnosis not present

## 2013-07-10 DIAGNOSIS — E039 Hypothyroidism, unspecified: Secondary | ICD-10-CM

## 2013-07-10 DIAGNOSIS — C8590 Non-Hodgkin lymphoma, unspecified, unspecified site: Secondary | ICD-10-CM

## 2013-07-10 DIAGNOSIS — N390 Urinary tract infection, site not specified: Secondary | ICD-10-CM | POA: Diagnosis not present

## 2013-07-10 DIAGNOSIS — N2889 Other specified disorders of kidney and ureter: Secondary | ICD-10-CM

## 2013-07-10 DIAGNOSIS — N289 Disorder of kidney and ureter, unspecified: Secondary | ICD-10-CM

## 2013-07-10 LAB — POCT URINALYSIS DIPSTICK
Bilirubin, UA: NEGATIVE
Blood, UA: NEGATIVE
Glucose, UA: NEGATIVE
Spec Grav, UA: 1.01
Urobilinogen, UA: 0.2

## 2013-07-10 LAB — COMPREHENSIVE METABOLIC PANEL
ALT: 15 U/L (ref 0–35)
AST: 20 U/L (ref 0–37)
Alkaline Phosphatase: 46 U/L (ref 39–117)
Creatinine, Ser: 0.8 mg/dL (ref 0.4–1.2)
Sodium: 140 mEq/L (ref 135–145)
Total Bilirubin: 0.4 mg/dL (ref 0.3–1.2)
Total Protein: 7.3 g/dL (ref 6.0–8.3)

## 2013-07-11 ENCOUNTER — Other Ambulatory Visit: Payer: Self-pay | Admitting: *Deleted

## 2013-07-11 MED ORDER — CIPROFLOXACIN HCL 500 MG PO TABS: 500.0000 mg | ORAL_TABLET | Freq: Two times a day (BID) | ORAL | Status: AC

## 2013-07-12 ENCOUNTER — Encounter: Payer: Self-pay | Admitting: Internal Medicine

## 2013-07-12 DIAGNOSIS — C8590 Non-Hodgkin lymphoma, unspecified, unspecified site: Secondary | ICD-10-CM | POA: Insufficient documentation

## 2013-07-12 LAB — URINE CULTURE: Colony Count: 90000

## 2013-07-12 NOTE — Assessment & Plan Note (Signed)
Some intermittent break through symptoms.  Declines any further evaluation and w/up.  Follow.

## 2013-07-12 NOTE — Assessment & Plan Note (Signed)
Blood pressure is under good control. Same medication regimen.  Check metabolic panel.  

## 2013-07-12 NOTE — Assessment & Plan Note (Signed)
On replacement.  Follow tsh.  

## 2013-07-12 NOTE — Progress Notes (Signed)
Subjective:    Patient ID: Tasha Avila, female    DOB: 02/14/1928, 79 y.o.   MRN: [id number]  Urinary Frequency  Associated symptoms include frequency.  79 year old female with past history of hypertension, anemia, hypercholesterolemia and hypothyroidism who is s/p lap cholecystectomy 5/11.  A renal mass was found incedentally on the ultrasound.  Subsequent CT revealed increased lymphadenopathy.  She underwent bx -negative.  Sill concern regarding possible lymphoma.  On exam last visit, she was noted to have a neck nodule.  Evaluated by ENT.  S/p removal.  Diagnosed with lymphoma.  S/p treatment.  Seeing Dr Doylene Canning.  She comes in today for a scheduled follow up.  She saw Dr Ernest Pine for her leg.  Diagnosed with arthritis.  Uses a topical medication - which helps some.  No chest pain or tightness.  Breathing stable.  No nausea or vomiting.  No bowel change.  Some increased dysuria and odor.     Past Medical History  Diagnosis Date  . Anemia   . Hypothyroidism   . Hypercholesterolemia   . Hypertension   . GERD (gastroesophageal reflux disease)     Outpatient Encounter Prescriptions as of 07/10/2013  Medication Sig Dispense Refill  . Homeopathic Products ([employer] LEG CRAMPS PAIN RELIEF PO) Take by mouth as needed.      Marland Kitchen levothyroxine (SYNTHROID, LEVOTHROID) 75 MCG tablet Take 1 tablet (75 mcg total) by mouth daily.  90 tablet  3  . lisinopril-hydrochlorothiazide (PRINZIDE,ZESTORETIC) 10-12.5 MG per tablet Take 1 tablet by mouth daily.  60 tablet  3  . pantoprazole (PROTONIX) 40 MG tablet Take 1 tablet (40 mg total) by mouth daily.  30 tablet  5  . [DISCONTINUED] nitrofurantoin, macrocrystal-monohydrate, (MACROBID) 100 MG capsule Take 100 mg by mouth 2 (two) times daily.      . [DISCONTINUED] nystatin cream (MYCOSTATIN) Apply topically 2 (two) times daily.  30 g  0   No facility-administered encounter medications on file as of 07/10/2013.    Review of Systems  Genitourinary: Positive for  frequency.  Patient denies any headache.  No significant sinus symptoms currently.   No chest pain, tightness or palpitations.  No increased shortness of breath, cough or congestion.  Breathing stable.  No nausea or vomiting.  No abdominal pain or cramping.  Bowels stable.  Urinary symptoms as outlined.  Some persistent leg discomfort.  The topical medication - helps.  Just completed treatment for lymphoma.  Planning for a follow up CT soon and then follow up with Dr Doylene Canning after CT.        Objective:   Physical Exam  Filed Vitals:   07/10/13 1358  BP: 120/78  Pulse: 58  Temp: 98.1 F (71.58 C)   79 year old female in no acute distress.   HEENT:  Nares- clear.  Oropharynx - without lesions. NECK:  Supple.  Nontender.  No audible bruit.  Well healed incision site s/p neck nodule removal.   HEART:  Appears to be regular. LUNGS:  No crackles or wheezing audible.  Respirations even and unlabored.  RADIAL PULSE:  Equal bilaterally.   ABDOMEN:  Soft, nontender.  Bowel sounds present and normal.  No audible abdominal bruit.   EXTREMITIES:  No increased edema present.  DP pulses palpable and equal bilaterally.             Assessment & Plan:  POSSIBLE UTI.  Check urinalysis and culture.  Treat if infection.   KNEE PAIN/LEG PAIN.  Previous ultrasound revealed a  Bakers cyst.  Given persistent problems was referred to Dr Ernest Pine.  Saw Dr Ernest Pine.  Diagnosed with arthritis.  Stable.   Follow.   MSK.  Has persistent back pain.  Declines any further w/up.  Follow.  Tylenol prn.    DERMATOLOGY.  Persistent intermittent perirectal and perivaginal irritation.  Nystatin cream as needed.  Improved currently.     HEALTH MAINTENANCE.  Physical last visit.  She is s/p hysterectomy.  Declines further GI evaluatoin., bone density and mammogram.

## 2013-07-12 NOTE — Assessment & Plan Note (Signed)
Found incidentally.  Evaluated by Urology and Dr Choksi.  CT revealed lymphadenopathy.  Continue follow up with Dr Choksi.    

## 2013-07-12 NOTE — Assessment & Plan Note (Signed)
Low cholesterol diet.  Check lipid panel with next fasting labs.  She is not fasting today.  Unable to check.    

## 2013-07-12 NOTE — Assessment & Plan Note (Signed)
Previous neck nodule removed and biopsy revealed lymphoma.  S/p treatment.  Seeing Dr Choksi.    

## 2013-07-17 ENCOUNTER — Encounter: Payer: Self-pay | Admitting: *Deleted

## 2013-07-18 ENCOUNTER — Ambulatory Visit: Payer: Self-pay | Admitting: Oncology

## 2013-07-18 DIAGNOSIS — E039 Hypothyroidism, unspecified: Secondary | ICD-10-CM | POA: Diagnosis not present

## 2013-07-18 DIAGNOSIS — C8298 Follicular lymphoma, unspecified, lymph nodes of multiple sites: Secondary | ICD-10-CM | POA: Diagnosis not present

## 2013-07-18 DIAGNOSIS — I10 Essential (primary) hypertension: Secondary | ICD-10-CM | POA: Diagnosis not present

## 2013-07-18 DIAGNOSIS — Z79899 Other long term (current) drug therapy: Secondary | ICD-10-CM | POA: Diagnosis not present

## 2013-07-18 DIAGNOSIS — E785 Hyperlipidemia, unspecified: Secondary | ICD-10-CM | POA: Diagnosis not present

## 2013-07-18 DIAGNOSIS — K219 Gastro-esophageal reflux disease without esophagitis: Secondary | ICD-10-CM | POA: Diagnosis not present

## 2013-07-18 LAB — CBC CANCER CENTER
Basophil #: 0.1 x10 3/mm (ref 0.0–0.1)
Eosinophil #: 0.1 x10 3/mm (ref 0.0–0.7)
Eosinophil %: 3 %
HCT: 36.5 % (ref 35.0–47.0)
HGB: 12.3 g/dL (ref 12.0–16.0)
Lymphocyte #: 0.9 x10 3/mm — ABNORMAL LOW (ref 1.0–3.6)
Lymphocyte %: 17.8 %
MCH: 31.1 pg (ref 26.0–34.0)
MCV: 93 fL (ref 80–100)
Monocyte #: 0.7 x10 3/mm (ref 0.2–0.9)
Monocyte %: 14.1 %
Platelet: 244 x10 3/mm (ref 150–440)
RBC: 3.94 10*6/uL (ref 3.80–5.20)
RDW: 13.4 % (ref 11.5–14.5)
WBC: 4.9 x10 3/mm (ref 3.6–11.0)

## 2013-07-18 LAB — COMPREHENSIVE METABOLIC PANEL
Anion Gap: 8 (ref 7–16)
Bilirubin,Total: 0.4 mg/dL (ref 0.2–1.0)
Calcium, Total: 8.7 mg/dL (ref 8.5–10.1)
Chloride: 107 mmol/L (ref 98–107)
Creatinine: 0.95 mg/dL (ref 0.60–1.30)
EGFR (African American): >60
Glucose: 107 mg/dL — ABNORMAL HIGH (ref 65–99)
Osmolality: 289 (ref 275–301)
SGPT (ALT): 18 U/L (ref 12–78)
Total Protein: 6.7 g/dL (ref 6.4–8.2)

## 2013-08-04 ENCOUNTER — Ambulatory Visit: Payer: Self-pay | Admitting: Oncology

## 2013-08-09 ENCOUNTER — Other Ambulatory Visit: Payer: Self-pay | Admitting: Internal Medicine

## 2013-10-06 ENCOUNTER — Other Ambulatory Visit: Payer: Self-pay | Admitting: *Deleted

## 2013-10-06 MED ORDER — LEVOTHYROXINE SODIUM 75 MCG PO TABS: 75.0000 ug | ORAL_TABLET | Freq: Every day | ORAL | Status: DC

## 2013-10-24 ENCOUNTER — Ambulatory Visit: Payer: Self-pay | Admitting: Oncology

## 2013-10-24 DIAGNOSIS — C8589 Other specified types of non-Hodgkin lymphoma, extranodal and solid organ sites: Secondary | ICD-10-CM | POA: Diagnosis not present

## 2013-10-24 DIAGNOSIS — I10 Essential (primary) hypertension: Secondary | ICD-10-CM | POA: Diagnosis not present

## 2013-10-24 DIAGNOSIS — Z79899 Other long term (current) drug therapy: Secondary | ICD-10-CM | POA: Diagnosis not present

## 2013-10-24 DIAGNOSIS — K219 Gastro-esophageal reflux disease without esophagitis: Secondary | ICD-10-CM | POA: Diagnosis not present

## 2013-10-24 DIAGNOSIS — E039 Hypothyroidism, unspecified: Secondary | ICD-10-CM | POA: Diagnosis not present

## 2013-10-24 DIAGNOSIS — E785 Hyperlipidemia, unspecified: Secondary | ICD-10-CM | POA: Diagnosis not present

## 2013-10-24 LAB — CBC CANCER CENTER
Basophil %: 1 %
Eosinophil %: 1.6 %
Lymphocyte #: 1 x10 3/mm (ref 1.0–3.6)
Lymphocyte %: 20.2 %
MCH: 30.5 pg (ref 26.0–34.0)
MCHC: 32.6 g/dL (ref 32.0–36.0)
MCV: 94 fL (ref 80–100)
Monocyte #: 0.7 x10 3/mm (ref 0.2–0.9)
Neutrophil #: 3.2 x10 3/mm (ref 1.4–6.5)
Neutrophil %: 63.7 %
Platelet: 275 x10 3/mm (ref 150–440)
RBC: 4.02 10*6/uL (ref 3.80–5.20)
WBC: 5 x10 3/mm (ref 3.6–11.0)

## 2013-10-24 LAB — COMPREHENSIVE METABOLIC PANEL
Albumin: 3.6 g/dL (ref 3.4–5.0)
Alkaline Phosphatase: 51 U/L (ref 50–136)
Calcium, Total: 8.9 mg/dL (ref 8.5–10.1)
Chloride: 103 mmol/L (ref 98–107)
EGFR (African American): >60
EGFR (Non-African Amer.): 59 — ABNORMAL LOW
Glucose: 107 mg/dL — ABNORMAL HIGH (ref 65–99)
Potassium: 4.5 mmol/L (ref 3.5–5.1)
SGOT(AST): 13 U/L — ABNORMAL LOW (ref 15–37)
SGPT (ALT): 21 U/L (ref 12–78)
Sodium: 142 mmol/L (ref 136–145)
Total Protein: 6.8 g/dL (ref 6.4–8.2)

## 2013-10-24 LAB — LACTATE DEHYDROGENASE: LDH: 170 U/L (ref 81–246)

## 2013-11-03 ENCOUNTER — Ambulatory Visit: Payer: Self-pay | Admitting: Oncology

## 2013-11-11 ENCOUNTER — Ambulatory Visit: Payer: Medicare Other | Admitting: Internal Medicine

## 2013-11-13 ENCOUNTER — Encounter: Payer: Self-pay | Admitting: Internal Medicine

## 2013-11-13 ENCOUNTER — Ambulatory Visit (INDEPENDENT_AMBULATORY_CARE_PROVIDER_SITE_OTHER): Payer: Medicare Other | Admitting: Internal Medicine

## 2013-11-13 VITALS — BP 110/60 | HR 53 | Temp 97.9°F | Ht 63.0 in | Wt 153.8 lb

## 2013-11-13 DIAGNOSIS — N289 Disorder of kidney and ureter, unspecified: Secondary | ICD-10-CM

## 2013-11-13 DIAGNOSIS — K219 Gastro-esophageal reflux disease without esophagitis: Secondary | ICD-10-CM

## 2013-11-13 DIAGNOSIS — I10 Essential (primary) hypertension: Secondary | ICD-10-CM

## 2013-11-13 DIAGNOSIS — C8590 Non-Hodgkin lymphoma, unspecified, unspecified site: Secondary | ICD-10-CM

## 2013-11-13 DIAGNOSIS — R5383 Other fatigue: Secondary | ICD-10-CM

## 2013-11-13 DIAGNOSIS — N2889 Other specified disorders of kidney and ureter: Secondary | ICD-10-CM

## 2013-11-13 DIAGNOSIS — R5381 Other malaise: Secondary | ICD-10-CM | POA: Diagnosis not present

## 2013-11-13 DIAGNOSIS — E7800 Pure hypercholesterolemia, unspecified: Secondary | ICD-10-CM

## 2013-11-13 DIAGNOSIS — E039 Hypothyroidism, unspecified: Secondary | ICD-10-CM

## 2013-11-13 DIAGNOSIS — C8589 Other specified types of non-Hodgkin lymphoma, extranodal and solid organ sites: Secondary | ICD-10-CM

## 2013-11-13 DIAGNOSIS — R350 Frequency of micturition: Secondary | ICD-10-CM | POA: Diagnosis not present

## 2013-11-13 NOTE — Progress Notes (Signed)
Pre-visit discussion using our clinic review tool. No additional management support is needed unless otherwise documented below in the visit note.  

## 2013-11-14 LAB — CBC WITH DIFFERENTIAL/PLATELET
Basophils Absolute: 0 10*3/uL (ref 0.0–0.1)
Basophils Relative: 0.1 % (ref 0.0–3.0)
Eosinophils Relative: 1.5 % (ref 0.0–5.0)
HCT: 38.4 % (ref 36.0–46.0)
Lymphs Abs: 1.1 10*3/uL (ref 0.7–4.0)
MCHC: 33 g/dL (ref 30.0–36.0)
MCV: 91.4 fl (ref 78.0–100.0)
Monocytes Absolute: 0.8 10*3/uL (ref 0.1–1.0)
Monocytes Relative: 10.1 % (ref 3.0–12.0)
Neutrophils Relative %: 73 % (ref 43.0–77.0)
Platelets: 299 10*3/uL (ref 150.0–400.0)
RBC: 4.2 Mil/uL (ref 3.87–5.11)
RDW: 14.1 % (ref 11.5–14.6)
WBC: 7.4 10*3/uL (ref 4.5–10.5)

## 2013-11-14 LAB — URINALYSIS, ROUTINE W REFLEX MICROSCOPIC
Bilirubin Urine: NEGATIVE
Hgb urine dipstick: NEGATIVE
Total Protein, Urine: NEGATIVE
Urine Glucose: NEGATIVE
Urobilinogen, UA: 0.2 (ref 0.0–1.0)

## 2013-11-14 LAB — COMPREHENSIVE METABOLIC PANEL
Alkaline Phosphatase: 46 U/L (ref 39–117)
BUN: 14 mg/dL (ref 6–23)
CO2: 30 mEq/L (ref 19–32)
Calcium: 9.3 mg/dL (ref 8.4–10.5)
GFR: 83.53 mL/min (ref >60.00)
Glucose, Bld: 85 mg/dL (ref 70–99)
Total Bilirubin: 0.6 mg/dL (ref 0.3–1.2)
Total Protein: 7.3 g/dL (ref 6.0–8.3)

## 2013-11-15 ENCOUNTER — Other Ambulatory Visit: Payer: Self-pay | Admitting: *Deleted

## 2013-11-15 LAB — CULTURE, URINE COMPREHENSIVE: Colony Count: >=100000

## 2013-11-15 MED ORDER — CEFUROXIME AXETIL 250 MG PO TABS: 250.0000 mg | ORAL_TABLET | Freq: Two times a day (BID) | ORAL | Status: DC

## 2013-11-16 ENCOUNTER — Encounter: Payer: Self-pay | Admitting: Internal Medicine

## 2013-11-16 NOTE — Assessment & Plan Note (Signed)
On replacement.  Follow tsh.  

## 2013-11-16 NOTE — Progress Notes (Signed)
Subjective:    Patient ID: Tasha Avila, female    DOB: 08-08-1928, 79 y.o.   MRN: [id number]  Urinary Frequency  Associated symptoms include frequency.  79 year old female with past history of hypertension, anemia, hypercholesterolemia and hypothyroidism who is s/p lap cholecystectomy 5/11.  A renal mass was found incedentally on the ultrasound.  Subsequent CT revealed increased lymphadenopathy.  She underwent bx -negative.  Sill concern regarding possible lymphoma.  On exam last visit, she was noted to have a neck nodule.  Evaluated by ENT.  S/p removal.  Diagnosed with lymphoma.  S/p treatment.  Seeing Dr Doylene Canning.  Sees him every 6 months.  She comes in today for a scheduled follow up.  She saw Dr Ernest Pine for her leg.  Diagnosed with arthritis.  Uses a topical medication - which helps some.  No chest pain or tightness.  Breathing stable.  No nausea or vomiting.  No bowel change.  Some increased urinary frequency.  Concern over possible infection.  She still has some back pain and joint pain.  Tylenol helps.     Past Medical History  Diagnosis Date  . Anemia   . Hypothyroidism   . Hypercholesterolemia   . Hypertension   . GERD (gastroesophageal reflux disease)     Outpatient Encounter Prescriptions as of 11/13/2013  Medication Sig  . Homeopathic Products ([employer] LEG CRAMPS PAIN RELIEF PO) Take by mouth as needed.  Marland Kitchen levothyroxine (SYNTHROID, LEVOTHROID) 75 MCG tablet Take 1 tablet (75 mcg total) by mouth daily.  Marland Kitchen lisinopril-hydrochlorothiazide (PRINZIDE,ZESTORETIC) 10-12.5 MG per tablet Take 1 tablet by mouth daily.  . pantoprazole (PROTONIX) 40 MG tablet TAKE ONE TABLET BY MOUTH ONCE DAILY    Review of Systems  Genitourinary: Positive for frequency.  Patient denies any headache.  No significant sinus symptoms currently.   No chest pain, tightness or palpitations.  No increased shortness of breath, cough or congestion.  Breathing stable.  No nausea or vomiting.  No abdominal pain or  cramping.  Bowels stable.  Urinary symptoms as outlined.  Some persistent back discomfort.  Tylenol helps.   Just completed treatment for lymphoma.  Sees Dr Doylene Canning every 6 months.          Objective:   Physical Exam  Filed Vitals:   11/13/13 1449  BP: 110/60  Pulse: 53  Temp: 97.9 F (36.6 C)   Pulse recheck:  50-75  79 year old female in no acute distress.   HEENT:  Nares- clear.  Oropharynx - without lesions. NECK:  Supple.  Nontender.  No audible bruit.  Well healed incision site s/p neck nodule removal.   HEART:  Appears to be regular. LUNGS:  No crackles or wheezing audible.  Respirations even and unlabored.  RADIAL PULSE:  Equal bilaterally.   ABDOMEN:  Soft, nontender.  Bowel sounds present and normal.  No audible abdominal bruit.   EXTREMITIES:  No increased edema present.  DP pulses palpable and equal bilaterally.             Assessment & Plan:  POSSIBLE UTI.  Check urinalysis and culture.  Treat if infection.   KNEE PAIN/LEG PAIN.  Previous ultrasound revealed a Bakers cyst.  Given persistent problems was referred to Dr Ernest Pine.  Saw Dr Ernest Pine.  Diagnosed with arthritis.  Stable.   Follow.   MSK.  Has persistent back pain.  Declines any further w/up.  Follow.  Tylenol prn.       HEALTH MAINTENANCE.  Physical 01/10/13.  She is s/p hysterectomy.  Declines further GI evaluatoin., bone density and mammogram.

## 2013-11-16 NOTE — Assessment & Plan Note (Signed)
Found incidentally.  Evaluated by Urology and Dr Choksi.  CT revealed lymphadenopathy.  Continue follow up with Dr Choksi.    

## 2013-11-16 NOTE — Assessment & Plan Note (Signed)
Previous neck nodule removed and biopsy revealed lymphoma.  S/p treatment.  Seeing Dr Choksi.    

## 2013-11-16 NOTE — Assessment & Plan Note (Signed)
States symptoms controlled.  Declines any further evaluation and w/up.  Follow.    

## 2013-11-16 NOTE — Assessment & Plan Note (Signed)
Low cholesterol diet.  Check lipid panel with next fasting labs.  She is not fasting today.  Unable to check.    

## 2013-11-16 NOTE — Assessment & Plan Note (Signed)
Blood pressure is under good control. Same medication regimen.  Check metabolic panel.  

## 2013-11-17 ENCOUNTER — Other Ambulatory Visit: Payer: Self-pay | Admitting: Internal Medicine

## 2014-01-17 ENCOUNTER — Other Ambulatory Visit: Payer: Self-pay | Admitting: Internal Medicine

## 2014-01-23 ENCOUNTER — Ambulatory Visit (INDEPENDENT_AMBULATORY_CARE_PROVIDER_SITE_OTHER): Payer: Medicare Other | Admitting: Adult Health

## 2014-01-23 ENCOUNTER — Encounter: Payer: Self-pay | Admitting: Adult Health

## 2014-01-23 VITALS — BP 108/60 | HR 72 | Temp 98.5°F | Resp 16 | Wt 152.0 lb

## 2014-01-23 DIAGNOSIS — B029 Zoster without complications: Secondary | ICD-10-CM | POA: Diagnosis not present

## 2014-01-23 DIAGNOSIS — H6120 Impacted cerumen, unspecified ear: Secondary | ICD-10-CM | POA: Insufficient documentation

## 2014-01-23 DIAGNOSIS — R82998 Other abnormal findings in urine: Secondary | ICD-10-CM

## 2014-01-23 DIAGNOSIS — R8290 Unspecified abnormal findings in urine: Secondary | ICD-10-CM | POA: Insufficient documentation

## 2014-01-23 LAB — POCT URINALYSIS DIPSTICK
Bilirubin, UA: NEGATIVE
Blood, UA: NEGATIVE
GLUCOSE UA: NEGATIVE
Ketones, UA: NEGATIVE
Leukocytes, UA: NEGATIVE
NITRITE UA: NEGATIVE
PH UA: 7
Protein, UA: NEGATIVE
Spec Grav, UA: 1.01
UROBILINOGEN UA: 0.2

## 2014-01-23 MED ORDER — VALACYCLOVIR HCL 1 G PO TABS: 1000.0000 mg | ORAL_TABLET | Freq: Three times a day (TID) | ORAL | Status: DC

## 2014-01-23 NOTE — Progress Notes (Signed)
Pre visit review using our clinic review tool, if applicable. No additional management support is needed unless otherwise documented below in the visit note. 

## 2014-01-23 NOTE — Progress Notes (Signed)
   Subjective:    Patient ID: Tasha Avila, female    DOB: 1928-01-09, 80 y.o.   MRN: [id number]  HPI  Pt is a pleasant 80 y/o female who presents to clinic with a few concerns:  1) She has developed a rash on her right buttock and leg. The rash began 2 days ago and is very painful. She has been taking tylenol for pain with reported relief.  2) She would like to see if her UTI has cleared. She reports a foul odor of urine. No fever, chills. Denies dysuria, hematuria, hesitancy.   3) She thinks her ears are stopped up with wax. She reports having problems with this periodically and has to get them irrigated.   Past Medical History  Diagnosis Date  . Anemia   . Hypothyroidism   . Hypercholesterolemia   . Hypertension   . GERD (gastroesophageal reflux disease)      Review of Systems  Constitutional: Negative for fever and chills.  HENT:       Ears feel stopped up  Respiratory: Negative.   Cardiovascular: Negative.   Gastrointestinal: Negative.   Genitourinary: Negative for dysuria, frequency, hematuria, flank pain and difficulty urinating (foul odor of urine).  Skin: Positive for rash (painful rash on right buttock, leg).  Neurological: Negative.   All other systems reviewed and are negative.       Objective:   Physical Exam  Constitutional: She is oriented to person, place, and time. No distress.  HENT:  Bilateral ears with cerumen build up  Cardiovascular: Normal rate and regular rhythm.   Pulmonary/Chest: Effort normal. No respiratory distress.  Neurological: She is alert and oriented to person, place, and time.  HOH  Skin: Rash noted. There is erythema.  Vesicular rash on right buttock, thigh  Psychiatric: She has a normal mood and affect. Her behavior is normal. Judgment and thought content normal.       Assessment & Plan:    1. Shingles rash Start valacyclovir 1000 mg tid x 7 days. Calamine lotion to the area 2-3 times a day. Continue tylenol for pain. Do  not exceed 3000 mg in 24 hour period. She had two separate bottles - one was labeled Tylenol and the other Acetaminophen. She was taking both. I labeled the Acetaminophen as "same as tylenol" and instructed pt to only take from one of the bottles. She verbalized understanding. RTC if rash worsens or if her pain becomes intolerable.  2. Bad odor of urine UA did not show any evidence of UTI - no blood, no nitrites or leukocytes. Urine is concentrated. She reports not drinking much water. Encouraged her to increase fluid intake.  - POCT Urinalysis Dipstick  3. Impacted cerumen Bilateral ears with cerumen. Irrigate bilateral ears.

## 2014-01-23 NOTE — Patient Instructions (Signed)
  You have shingles:  Start Valacyclovir 1000 mg 3 times a day for 7 days.  Apply Calamine lotion to the rash 2-3 times a day to help dry it up.  Continue to take Tylenol for pain. Do no take more than 3000 mg in a 24 hour period.  Your urinary tract infection has cleared. You need to drink water so that your urine is not so concentrated. A concentrated urine can have a strong odor.  We have irrigated both ears for wax build up. We will check this for you periodically.   Please call our office if your shingles becomes worse or your pain is not relieved.

## 2014-01-28 ENCOUNTER — Telehealth: Payer: Self-pay | Admitting: Internal Medicine

## 2014-01-28 NOTE — Telephone Encounter (Signed)
The patient wants a phone call back from Dr. Nicki Reaper about her condition.

## 2014-01-28 NOTE — Telephone Encounter (Signed)
See below

## 2014-01-28 NOTE — Telephone Encounter (Signed)
Pt is in a lot of pain, legs feels partially paralyzed (numb) x 2-3 days. Has shingles on right from knee up to private area. Has one more dose to take for shingles.

## 2014-01-28 NOTE — Telephone Encounter (Signed)
Called pt and informed her that she needed to be evaluated.  She is unable to be seen today.  Told her that I would work her in tomorrow - to call first thing in the am for work in appt.  States she has to get a ride, but will do this and call.

## 2014-01-29 ENCOUNTER — Ambulatory Visit: Payer: Medicare Other

## 2014-01-30 ENCOUNTER — Encounter: Payer: Self-pay | Admitting: Internal Medicine

## 2014-01-30 ENCOUNTER — Ambulatory Visit (INDEPENDENT_AMBULATORY_CARE_PROVIDER_SITE_OTHER): Payer: Medicare Other | Admitting: Internal Medicine

## 2014-01-30 VITALS — BP 120/70 | HR 55 | Temp 98.0°F | Ht 63.0 in | Wt 150.0 lb

## 2014-01-30 DIAGNOSIS — R2681 Unsteadiness on feet: Secondary | ICD-10-CM

## 2014-01-30 DIAGNOSIS — R269 Unspecified abnormalities of gait and mobility: Secondary | ICD-10-CM | POA: Diagnosis not present

## 2014-01-30 DIAGNOSIS — B029 Zoster without complications: Secondary | ICD-10-CM

## 2014-01-30 DIAGNOSIS — H6120 Impacted cerumen, unspecified ear: Secondary | ICD-10-CM

## 2014-01-30 DIAGNOSIS — K219 Gastro-esophageal reflux disease without esophagitis: Secondary | ICD-10-CM

## 2014-01-30 NOTE — Patient Instructions (Signed)
Take the tylenol scheduled as we discussed.

## 2014-01-31 ENCOUNTER — Other Ambulatory Visit: Payer: Self-pay | Admitting: Internal Medicine

## 2014-01-31 ENCOUNTER — Encounter: Payer: Self-pay | Admitting: Internal Medicine

## 2014-01-31 DIAGNOSIS — M79606 Pain in leg, unspecified: Secondary | ICD-10-CM

## 2014-01-31 DIAGNOSIS — R2681 Unsteadiness on feet: Secondary | ICD-10-CM | POA: Insufficient documentation

## 2014-01-31 DIAGNOSIS — M549 Dorsalgia, unspecified: Secondary | ICD-10-CM

## 2014-01-31 NOTE — Progress Notes (Signed)
Order placed for home health

## 2014-01-31 NOTE — Assessment & Plan Note (Signed)
States symptoms controlled.  Declines any further evaluation and w/up.  Follow.    

## 2014-01-31 NOTE — Assessment & Plan Note (Signed)
Ears irrigated today.  Pt tolerated well.

## 2014-01-31 NOTE — Progress Notes (Signed)
Subjective:    Patient ID: Tasha Avila, female    DOB: 1927/12/11, 80 y.o.   MRN: [id number]  Leg Pain   80 year old female with past history of hypertension, anemia, hypercholesterolemia and hypothyroidism who is s/p lap cholecystectomy 5/11.  A renal mass was found incedentally on the ultrasound.  Subsequent CT revealed increased lymphadenopathy.  She underwent bx -negative.  Sill concern regarding possible lymphoma.  She comes in today as a work in with concerns regarding left leg pain.  She saw Raquel Rey on 01/23/14.  Diagnosed with shingles.  Prescribed valtrex.  States having increased pain.  Leg gave out on her last night.  Did not hurt herself.  Eating and drinking well.  No nausea or vomiting.  Has been diagnosed with arthritis.  Uses a topical medication - which helps some.  No chest pain or tightness.  Breathing stable.  No bowel change.  Some unsteadiness with ambulation.  Has a walker.   We discussed using this regularly to help with support.  Takes tylenol prn and when takes it - it helps.     Past Medical History  Diagnosis Date  . Anemia   . Hypothyroidism   . Hypercholesterolemia   . Hypertension   . GERD (gastroesophageal reflux disease)     Outpatient Encounter Prescriptions as of 01/30/2014  Medication Sig  . acetaminophen (TYLENOL) 500 MG tablet Take 500 mg by mouth every 6 (six) hours as needed.  . Homeopathic Products ([employer] LEG CRAMPS PAIN RELIEF PO) Take by mouth as needed.  Marland Kitchen levothyroxine (SYNTHROID, LEVOTHROID) 75 MCG tablet Take 1 tablet (75 mcg total) by mouth daily.  Marland Kitchen lisinopril-hydrochlorothiazide (PRINZIDE,ZESTORETIC) 10-12.5 MG per tablet TAKE ONE TABLET BY MOUTH ONCE DAILY  . pantoprazole (PROTONIX) 40 MG tablet TAKE ONE TABLET BY MOUTH ONCE DAILY  . [DISCONTINUED] valACYclovir (VALTREX) 1000 MG tablet Take 1 tablet (1,000 mg total) by mouth 3 (three) times daily.    Review of Systems Patient denies any headache.  No significant sinus or allergy  symptoms.  No chest pain, tightness or palpitations.  No increased shortness of breath, cough or congestion.  Breathing stable.  No nausea or vomiting.  No abdominal pain or cramping.  Bowels stable.  No urinary problems now. Leg pain as outlined.          Objective:   Physical Exam  Filed Vitals:   01/30/14 1225  BP: 120/70  Pulse: 55  Temp: 98 F (61.60 C)   80 year old female in no acute distress.   HEENT:  Nares- clear.  Oropharynx - without lesions. NECK:  Supple.  Nontender.  No audible bruit.  Palpable right neck nodule.  HEART:  Appears to be regular. LUNGS:  No crackles or wheezing audible.  Respirations even and unlabored.  RADIAL PULSE:  Equal bilaterally.    ABDOMEN:  Soft, nontender.  Bowel sounds present and normal.  No audible abdominal bruit.    EXTREMITIES:  No increased edema present.  DP pulses palpable and equal bilaterally.  SKIN:  Rash right leg c/w shingles rash.              Assessment & Plan:  KNEE PAIN/LEG PAIN.  Previous ultrasound revealed a Bakers cyst.  Given persistent problems was referred to Dr Marry Guan.  Saw Dr Marry Guan.  Diagnosed with arthritis.  Stable.   Follow.   MSK.  Has had persistent back pain.  Has declined any further w/up.  Follow.  Tylenol prn.    DERMATOLOGY.  Perirectal and perivaginal irritation improved with Nystatin cream as needed.      HEALTH MAINTENANCE.  She is s/p hysterectomy.  Declines further GI evaluatoin., bone density and mammogram.

## 2014-01-31 NOTE — Assessment & Plan Note (Signed)
Has had issues with increased back and leg pain previously.  Diagnosed with arthritis.  Has had trouble previously with ambulation.  Appears to be worse now with the increased pain from shingles.  Instructed to use her walker regularly.  Have Home Health come out for evaluation and physical therapy.

## 2014-01-31 NOTE — Assessment & Plan Note (Signed)
Persistent rash.  Persistent pain.  We discussed different treatment options.  Discussed the possibility of gabapentin (low dose).  Tylenol works for her.  She does not take it scheduled.  Have her take tylenol scheduled as directed.  Follow.  Her daughter accompanied her today.  Discussed all of the above with both of them.  If persistent problems, may have to add a low dose of gabapentin.

## 2014-02-03 DIAGNOSIS — I10 Essential (primary) hypertension: Secondary | ICD-10-CM | POA: Diagnosis not present

## 2014-02-03 DIAGNOSIS — M549 Dorsalgia, unspecified: Secondary | ICD-10-CM | POA: Diagnosis not present

## 2014-02-03 DIAGNOSIS — D649 Anemia, unspecified: Secondary | ICD-10-CM | POA: Diagnosis not present

## 2014-02-03 DIAGNOSIS — R262 Difficulty in walking, not elsewhere classified: Secondary | ICD-10-CM | POA: Diagnosis not present

## 2014-02-03 DIAGNOSIS — M25569 Pain in unspecified knee: Secondary | ICD-10-CM | POA: Diagnosis not present

## 2014-02-03 DIAGNOSIS — M129 Arthropathy, unspecified: Secondary | ICD-10-CM | POA: Diagnosis not present

## 2014-02-03 DIAGNOSIS — IMO0001 Reserved for inherently not codable concepts without codable children: Secondary | ICD-10-CM | POA: Diagnosis not present

## 2014-02-03 DIAGNOSIS — M79609 Pain in unspecified limb: Secondary | ICD-10-CM | POA: Diagnosis not present

## 2014-02-03 DIAGNOSIS — B029 Zoster without complications: Secondary | ICD-10-CM | POA: Diagnosis not present

## 2014-02-03 DIAGNOSIS — Z9181 History of falling: Secondary | ICD-10-CM | POA: Diagnosis not present

## 2014-02-06 DIAGNOSIS — M79609 Pain in unspecified limb: Secondary | ICD-10-CM | POA: Diagnosis not present

## 2014-02-06 DIAGNOSIS — R262 Difficulty in walking, not elsewhere classified: Secondary | ICD-10-CM | POA: Diagnosis not present

## 2014-02-06 DIAGNOSIS — Z9181 History of falling: Secondary | ICD-10-CM | POA: Diagnosis not present

## 2014-02-06 DIAGNOSIS — M25569 Pain in unspecified knee: Secondary | ICD-10-CM | POA: Diagnosis not present

## 2014-02-06 DIAGNOSIS — B029 Zoster without complications: Secondary | ICD-10-CM | POA: Diagnosis not present

## 2014-02-06 DIAGNOSIS — IMO0001 Reserved for inherently not codable concepts without codable children: Secondary | ICD-10-CM | POA: Diagnosis not present

## 2014-02-09 DIAGNOSIS — M25569 Pain in unspecified knee: Secondary | ICD-10-CM | POA: Diagnosis not present

## 2014-02-09 DIAGNOSIS — R262 Difficulty in walking, not elsewhere classified: Secondary | ICD-10-CM | POA: Diagnosis not present

## 2014-02-09 DIAGNOSIS — B029 Zoster without complications: Secondary | ICD-10-CM | POA: Diagnosis not present

## 2014-02-09 DIAGNOSIS — IMO0001 Reserved for inherently not codable concepts without codable children: Secondary | ICD-10-CM | POA: Diagnosis not present

## 2014-02-09 DIAGNOSIS — M79609 Pain in unspecified limb: Secondary | ICD-10-CM | POA: Diagnosis not present

## 2014-02-09 DIAGNOSIS — Z9181 History of falling: Secondary | ICD-10-CM | POA: Diagnosis not present

## 2014-02-10 DIAGNOSIS — Z9181 History of falling: Secondary | ICD-10-CM | POA: Diagnosis not present

## 2014-02-10 DIAGNOSIS — R262 Difficulty in walking, not elsewhere classified: Secondary | ICD-10-CM | POA: Diagnosis not present

## 2014-02-10 DIAGNOSIS — B029 Zoster without complications: Secondary | ICD-10-CM | POA: Diagnosis not present

## 2014-02-10 DIAGNOSIS — M79609 Pain in unspecified limb: Secondary | ICD-10-CM | POA: Diagnosis not present

## 2014-02-10 DIAGNOSIS — IMO0001 Reserved for inherently not codable concepts without codable children: Secondary | ICD-10-CM | POA: Diagnosis not present

## 2014-02-10 DIAGNOSIS — M25569 Pain in unspecified knee: Secondary | ICD-10-CM | POA: Diagnosis not present

## 2014-02-12 ENCOUNTER — Other Ambulatory Visit: Payer: Self-pay | Admitting: *Deleted

## 2014-02-12 ENCOUNTER — Other Ambulatory Visit: Payer: Self-pay | Admitting: Internal Medicine

## 2014-02-12 DIAGNOSIS — M79609 Pain in unspecified limb: Secondary | ICD-10-CM | POA: Diagnosis not present

## 2014-02-12 DIAGNOSIS — Z9181 History of falling: Secondary | ICD-10-CM | POA: Diagnosis not present

## 2014-02-12 DIAGNOSIS — IMO0001 Reserved for inherently not codable concepts without codable children: Secondary | ICD-10-CM | POA: Diagnosis not present

## 2014-02-12 DIAGNOSIS — R262 Difficulty in walking, not elsewhere classified: Secondary | ICD-10-CM | POA: Diagnosis not present

## 2014-02-12 DIAGNOSIS — B029 Zoster without complications: Secondary | ICD-10-CM | POA: Diagnosis not present

## 2014-02-12 DIAGNOSIS — M25569 Pain in unspecified knee: Secondary | ICD-10-CM | POA: Diagnosis not present

## 2014-02-12 MED ORDER — LISINOPRIL 10 MG PO TABS: 10.0000 mg | ORAL_TABLET | Freq: Every day | ORAL | Status: DC

## 2014-02-16 DIAGNOSIS — M79609 Pain in unspecified limb: Secondary | ICD-10-CM | POA: Diagnosis not present

## 2014-02-16 DIAGNOSIS — IMO0001 Reserved for inherently not codable concepts without codable children: Secondary | ICD-10-CM | POA: Diagnosis not present

## 2014-02-16 DIAGNOSIS — B029 Zoster without complications: Secondary | ICD-10-CM | POA: Diagnosis not present

## 2014-02-16 DIAGNOSIS — R262 Difficulty in walking, not elsewhere classified: Secondary | ICD-10-CM | POA: Diagnosis not present

## 2014-02-16 DIAGNOSIS — Z9181 History of falling: Secondary | ICD-10-CM | POA: Diagnosis not present

## 2014-02-16 DIAGNOSIS — M25569 Pain in unspecified knee: Secondary | ICD-10-CM | POA: Diagnosis not present

## 2014-02-19 DIAGNOSIS — IMO0001 Reserved for inherently not codable concepts without codable children: Secondary | ICD-10-CM | POA: Diagnosis not present

## 2014-02-19 DIAGNOSIS — Z9181 History of falling: Secondary | ICD-10-CM | POA: Diagnosis not present

## 2014-02-19 DIAGNOSIS — B029 Zoster without complications: Secondary | ICD-10-CM | POA: Diagnosis not present

## 2014-02-19 DIAGNOSIS — R262 Difficulty in walking, not elsewhere classified: Secondary | ICD-10-CM | POA: Diagnosis not present

## 2014-02-19 DIAGNOSIS — M25569 Pain in unspecified knee: Secondary | ICD-10-CM | POA: Diagnosis not present

## 2014-02-19 DIAGNOSIS — M79609 Pain in unspecified limb: Secondary | ICD-10-CM | POA: Diagnosis not present

## 2014-02-22 ENCOUNTER — Emergency Department: Payer: Self-pay | Admitting: Internal Medicine

## 2014-02-22 DIAGNOSIS — Z79899 Other long term (current) drug therapy: Secondary | ICD-10-CM | POA: Diagnosis not present

## 2014-02-22 DIAGNOSIS — B0229 Other postherpetic nervous system involvement: Secondary | ICD-10-CM | POA: Diagnosis not present

## 2014-02-22 DIAGNOSIS — G8929 Other chronic pain: Secondary | ICD-10-CM | POA: Diagnosis not present

## 2014-02-22 DIAGNOSIS — S8000XA Contusion of unspecified knee, initial encounter: Secondary | ICD-10-CM | POA: Diagnosis not present

## 2014-02-22 DIAGNOSIS — K219 Gastro-esophageal reflux disease without esophagitis: Secondary | ICD-10-CM | POA: Diagnosis not present

## 2014-02-22 DIAGNOSIS — IMO0002 Reserved for concepts with insufficient information to code with codable children: Secondary | ICD-10-CM | POA: Diagnosis not present

## 2014-02-22 DIAGNOSIS — R21 Rash and other nonspecific skin eruption: Secondary | ICD-10-CM | POA: Diagnosis not present

## 2014-02-22 DIAGNOSIS — M1710 Unilateral primary osteoarthritis, unspecified knee: Secondary | ICD-10-CM | POA: Diagnosis not present

## 2014-02-22 DIAGNOSIS — I10 Essential (primary) hypertension: Secondary | ICD-10-CM | POA: Diagnosis not present

## 2014-02-22 DIAGNOSIS — M549 Dorsalgia, unspecified: Secondary | ICD-10-CM | POA: Diagnosis not present

## 2014-02-22 LAB — COMPREHENSIVE METABOLIC PANEL
ALK PHOS: 50 U/L
ANION GAP: 3 — AB (ref 7–16)
Albumin: 3.8 g/dL (ref 3.4–5.0)
BUN: 7 mg/dL (ref 7–18)
Bilirubin,Total: 0.5 mg/dL (ref 0.2–1.0)
CALCIUM: 8.9 mg/dL (ref 8.5–10.1)
CHLORIDE: 101 mmol/L (ref 98–107)
CO2: 30 mmol/L (ref 21–32)
CREATININE: 0.69 mg/dL (ref 0.60–1.30)
EGFR (African American): >60
GLUCOSE: 91 mg/dL (ref 65–99)
Osmolality: 266 (ref 275–301)
POTASSIUM: 3.9 mmol/L (ref 3.5–5.1)
SGOT(AST): 15 U/L (ref 15–37)
SGPT (ALT): 18 U/L (ref 12–78)
Sodium: 134 mmol/L — ABNORMAL LOW (ref 136–145)
Total Protein: 7 g/dL (ref 6.4–8.2)

## 2014-02-22 LAB — CBC
HCT: 36.6 % (ref 35.0–47.0)
HGB: 12.1 g/dL (ref 12.0–16.0)
MCH: 31.2 pg (ref 26.0–34.0)
MCHC: 33.1 g/dL (ref 32.0–36.0)
MCV: 95 fL (ref 80–100)
PLATELETS: 263 10*3/uL (ref 150–440)
RBC: 3.88 10*6/uL (ref 3.80–5.20)
RDW: 14.7 % — AB (ref 11.5–14.5)
WBC: 9.1 10*3/uL (ref 3.6–11.0)

## 2014-02-22 LAB — URINALYSIS, COMPLETE
Bilirubin,UR: NEGATIVE
Blood: NEGATIVE
Glucose,UR: NEGATIVE mg/dL (ref 0–75)
KETONE: NEGATIVE
Nitrite: NEGATIVE
PROTEIN: NEGATIVE
Ph: 7 (ref 4.5–8.0)
RBC,UR: 2 /HPF (ref 0–5)
SPECIFIC GRAVITY: 1.009 (ref 1.003–1.030)
Squamous Epithelial: 11
WBC UR: 7 /HPF (ref 0–5)

## 2014-02-23 DIAGNOSIS — Z9181 History of falling: Secondary | ICD-10-CM | POA: Diagnosis not present

## 2014-02-23 DIAGNOSIS — R262 Difficulty in walking, not elsewhere classified: Secondary | ICD-10-CM | POA: Diagnosis not present

## 2014-02-23 DIAGNOSIS — B029 Zoster without complications: Secondary | ICD-10-CM | POA: Diagnosis not present

## 2014-02-23 DIAGNOSIS — M79609 Pain in unspecified limb: Secondary | ICD-10-CM | POA: Diagnosis not present

## 2014-02-23 DIAGNOSIS — IMO0001 Reserved for inherently not codable concepts without codable children: Secondary | ICD-10-CM | POA: Diagnosis not present

## 2014-02-23 DIAGNOSIS — M25569 Pain in unspecified knee: Secondary | ICD-10-CM | POA: Diagnosis not present

## 2014-02-25 DIAGNOSIS — M79609 Pain in unspecified limb: Secondary | ICD-10-CM | POA: Diagnosis not present

## 2014-02-25 DIAGNOSIS — IMO0001 Reserved for inherently not codable concepts without codable children: Secondary | ICD-10-CM | POA: Diagnosis not present

## 2014-02-25 DIAGNOSIS — M25569 Pain in unspecified knee: Secondary | ICD-10-CM | POA: Diagnosis not present

## 2014-02-25 DIAGNOSIS — R262 Difficulty in walking, not elsewhere classified: Secondary | ICD-10-CM | POA: Diagnosis not present

## 2014-02-25 DIAGNOSIS — Z9181 History of falling: Secondary | ICD-10-CM | POA: Diagnosis not present

## 2014-02-25 DIAGNOSIS — B029 Zoster without complications: Secondary | ICD-10-CM | POA: Diagnosis not present

## 2014-03-03 DIAGNOSIS — M79609 Pain in unspecified limb: Secondary | ICD-10-CM | POA: Diagnosis not present

## 2014-03-03 DIAGNOSIS — Z9181 History of falling: Secondary | ICD-10-CM | POA: Diagnosis not present

## 2014-03-03 DIAGNOSIS — R262 Difficulty in walking, not elsewhere classified: Secondary | ICD-10-CM | POA: Diagnosis not present

## 2014-03-03 DIAGNOSIS — IMO0001 Reserved for inherently not codable concepts without codable children: Secondary | ICD-10-CM | POA: Diagnosis not present

## 2014-03-03 DIAGNOSIS — M25569 Pain in unspecified knee: Secondary | ICD-10-CM | POA: Diagnosis not present

## 2014-03-03 DIAGNOSIS — B029 Zoster without complications: Secondary | ICD-10-CM | POA: Diagnosis not present

## 2014-03-05 DIAGNOSIS — M79609 Pain in unspecified limb: Secondary | ICD-10-CM | POA: Diagnosis not present

## 2014-03-05 DIAGNOSIS — R262 Difficulty in walking, not elsewhere classified: Secondary | ICD-10-CM | POA: Diagnosis not present

## 2014-03-05 DIAGNOSIS — B029 Zoster without complications: Secondary | ICD-10-CM | POA: Diagnosis not present

## 2014-03-05 DIAGNOSIS — M25569 Pain in unspecified knee: Secondary | ICD-10-CM | POA: Diagnosis not present

## 2014-03-05 DIAGNOSIS — Z9181 History of falling: Secondary | ICD-10-CM | POA: Diagnosis not present

## 2014-03-05 DIAGNOSIS — IMO0001 Reserved for inherently not codable concepts without codable children: Secondary | ICD-10-CM | POA: Diagnosis not present

## 2014-03-09 DIAGNOSIS — R262 Difficulty in walking, not elsewhere classified: Secondary | ICD-10-CM | POA: Diagnosis not present

## 2014-03-09 DIAGNOSIS — M79609 Pain in unspecified limb: Secondary | ICD-10-CM | POA: Diagnosis not present

## 2014-03-09 DIAGNOSIS — M25569 Pain in unspecified knee: Secondary | ICD-10-CM | POA: Diagnosis not present

## 2014-03-09 DIAGNOSIS — IMO0001 Reserved for inherently not codable concepts without codable children: Secondary | ICD-10-CM | POA: Diagnosis not present

## 2014-03-09 DIAGNOSIS — Z9181 History of falling: Secondary | ICD-10-CM | POA: Diagnosis not present

## 2014-03-09 DIAGNOSIS — B029 Zoster without complications: Secondary | ICD-10-CM | POA: Diagnosis not present

## 2014-03-11 DIAGNOSIS — IMO0001 Reserved for inherently not codable concepts without codable children: Secondary | ICD-10-CM | POA: Diagnosis not present

## 2014-03-11 DIAGNOSIS — M25569 Pain in unspecified knee: Secondary | ICD-10-CM | POA: Diagnosis not present

## 2014-03-11 DIAGNOSIS — B029 Zoster without complications: Secondary | ICD-10-CM | POA: Diagnosis not present

## 2014-03-11 DIAGNOSIS — Z9181 History of falling: Secondary | ICD-10-CM | POA: Diagnosis not present

## 2014-03-11 DIAGNOSIS — R262 Difficulty in walking, not elsewhere classified: Secondary | ICD-10-CM | POA: Diagnosis not present

## 2014-03-11 DIAGNOSIS — M79609 Pain in unspecified limb: Secondary | ICD-10-CM | POA: Diagnosis not present

## 2014-03-17 ENCOUNTER — Ambulatory Visit (INDEPENDENT_AMBULATORY_CARE_PROVIDER_SITE_OTHER): Payer: Medicare Other | Admitting: Internal Medicine

## 2014-03-17 ENCOUNTER — Encounter (INDEPENDENT_AMBULATORY_CARE_PROVIDER_SITE_OTHER): Payer: Self-pay

## 2014-03-17 ENCOUNTER — Encounter: Payer: Self-pay | Admitting: Internal Medicine

## 2014-03-17 VITALS — BP 130/78 | HR 65 | Temp 98.7°F | Ht 63.0 in | Wt 145.2 lb

## 2014-03-17 DIAGNOSIS — C8589 Other specified types of non-Hodgkin lymphoma, extranodal and solid organ sites: Secondary | ICD-10-CM

## 2014-03-17 DIAGNOSIS — I10 Essential (primary) hypertension: Secondary | ICD-10-CM | POA: Diagnosis not present

## 2014-03-17 DIAGNOSIS — M79609 Pain in unspecified limb: Secondary | ICD-10-CM

## 2014-03-17 DIAGNOSIS — M79604 Pain in right leg: Secondary | ICD-10-CM

## 2014-03-17 DIAGNOSIS — K219 Gastro-esophageal reflux disease without esophagitis: Secondary | ICD-10-CM | POA: Diagnosis not present

## 2014-03-17 DIAGNOSIS — N289 Disorder of kidney and ureter, unspecified: Secondary | ICD-10-CM

## 2014-03-17 DIAGNOSIS — C8590 Non-Hodgkin lymphoma, unspecified, unspecified site: Secondary | ICD-10-CM

## 2014-03-17 DIAGNOSIS — N2889 Other specified disorders of kidney and ureter: Secondary | ICD-10-CM

## 2014-03-17 MED ORDER — LISINOPRIL 10 MG PO TABS: 10.0000 mg | ORAL_TABLET | Freq: Every day | ORAL | Status: DC

## 2014-03-17 MED ORDER — CITALOPRAM HYDROBROMIDE 10 MG PO TABS: ORAL_TABLET | ORAL | Status: DC

## 2014-03-17 NOTE — Progress Notes (Signed)
Subjective:    Patient ID: Tasha Avila, female    DOB: Feb 16, 1928, 80 y.o.   MRN: [id number]  Leg Pain   80 year old female with past history of hypertension, anemia, hypercholesterolemia and hypothyroidism who is s/p lap cholecystectomy 5/11.  A renal mass was found incedentally on the ultrasound.  Subsequent CT revealed increased lymphadenopathy.  She underwent bx -negative.  Sill concern regarding possible lymphoma.  She comes in today for a scheduled follow up.  She saw Raquel Rey on 01/23/14.  Diagnosed with shingles.  Prescribed valtrex.  Still having increased pain.  Has had several falls.  She has a previous history of back pain.  Describes this pain involves her right leg. She still has residual scars and lesions from her shingles flare.  She feels that her leg is "partially paralyzed".  She is having problems lifting her leg.  She has to manually lift her left/thigh with her hands.  This is contributing to the falls. States this all started after the shingles flare.  Eating and drinking well.  No nausea or vomiting.   No chest pain or tightness.  Breathing stable.  No bowel change.   Has a walker.   We discussed using this regularly to help with support.  Tylenol and advil help when she takes them.  She is more anxious.  States she needs something for stress and anxiety.  Denies depression.  Her brother has been staying with her.  We discussed her not staying by herself.  Also discussed the need for [facility] and discussed assisted living.     Past Medical History  Diagnosis Date  . Anemia   . Hypothyroidism   . Hypercholesterolemia   . Hypertension   . GERD (gastroesophageal reflux disease)     Outpatient Encounter Prescriptions as of 03/17/2014  Medication Sig  . acetaminophen (TYLENOL) 500 MG tablet Take 500 mg by mouth every 6 (six) hours as needed.  . Homeopathic Products ([employer] LEG CRAMPS PAIN RELIEF PO) Take by mouth as needed.  Marland Kitchen levothyroxine (SYNTHROID, LEVOTHROID) 75 MCG  tablet Take 1 tablet (75 mcg total) by mouth daily.  Marland Kitchen lisinopril (PRINIVIL,ZESTRIL) 10 MG tablet Take 1 tablet (10 mg total) by mouth daily.  . pantoprazole (PROTONIX) 40 MG tablet TAKE ONE TABLET BY MOUTH ONCE DAILY    Review of Systems Patient denies any headache.  No significant sinus or allergy symptoms.  No chest pain, tightness or palpitations.  No increased shortness of breath, cough or congestion.  Breathing stable.  No nausea or vomiting.  No abdominal pain or cramping.  Bowels stable.  No urinary problems now. Leg pain and symptoms as outlined.         Objective:   Physical Exam  Filed Vitals:   03/17/14 1410  BP: 130/78  Pulse: 65  Temp: 98.7 F (63.39 C)   80 year old female in no acute distress.  NECK:  Supple.  Nontender.  No audible bruit.  Palpable right neck nodule.  HEART:  Appears to be regular. LUNGS:  No crackles or wheezing audible.  Respirations even and unlabored.  RADIAL PULSE:  Equal bilaterally.    ABDOMEN:  Soft, nontender.  Bowel sounds present and normal.  No audible abdominal bruit.    EXTREMITIES:  No increased edema present.  Stable.  SKIN:  Scarring and lesions right leg c/w previous shingles rash.    MSK/NEURO:  Proximal leg weakness with inability to lift her leg (except manually lifting).  Some increased pain  with straight leg raise.  No calf pain.  Able to stand from a seated position with use of her arms/hands.  Able to walk with walker.             Assessment & Plan:  PREVIOUS KNEE PAIN  Previous ultrasound revealed a Bakers cyst.  Given persistent problems was referred to Dr Marry Guan.  Saw Dr Marry Guan.  Diagnosed with arthritis.  Stable.   Follow.   DERMATOLOGY.  Perirectal and perivaginal irritation improved with Nystatin cream as needed.      HEALTH MAINTENANCE.  She is s/p hysterectomy.  Declines further GI evaluatoin., bone density and mammogram.    I spent more than 25 minutes with the patient and more than 50% of the time was spent in  consultation regarding the above.

## 2014-03-17 NOTE — Progress Notes (Signed)
Pre visit review using our clinic review tool, if applicable. No additional management support is needed unless otherwise documented below in the visit note. 

## 2014-03-19 ENCOUNTER — Encounter: Payer: Self-pay | Admitting: Internal Medicine

## 2014-03-19 DIAGNOSIS — M79604 Pain in right leg: Secondary | ICD-10-CM | POA: Insufficient documentation

## 2014-03-19 NOTE — Assessment & Plan Note (Signed)
States symptoms controlled.  Declines any further evaluation and w/up.  Follow.    

## 2014-03-19 NOTE — Assessment & Plan Note (Signed)
Persistent right leg pain and weakness.  Has to now manually left her leg.  States all occurred after shingles flare.  Increased pain.  Discussed shingles.  Discussed pain from shingles.  I am concerned about the proximal muscle weakness.  Discussed various options for further evaluation.  She prefers to see Dr Marry Guan (or his office).  Wants to hold on xray today.  Has to get someone to drive her.  Will schedule an appt with ortho for further evaluation and treatment.  May need neurology evaluation with leg weakness, etc.  Instructed to use her walker regularly.  Needs someone staying with her.  Discussed assisted living and [facility].

## 2014-03-19 NOTE — Assessment & Plan Note (Signed)
Found incidentally.  Evaluated by Urology and Dr Choksi.  CT revealed lymphadenopathy.  Continue follow up with Dr Choksi.    

## 2014-03-19 NOTE — Assessment & Plan Note (Signed)
Blood pressure is under good control. Same medication regimen.  Follow metabolic panel.    

## 2014-03-19 NOTE — Assessment & Plan Note (Signed)
Previous neck nodule removed and biopsy revealed lymphoma.  S/p treatment.  Seeing Dr Choksi.    

## 2014-03-30 ENCOUNTER — Telehealth: Payer: Self-pay | Admitting: Internal Medicine

## 2014-03-30 NOTE — Telephone Encounter (Signed)
Pt notified and verbalized understanding. Advised to call back with persistent or worsening symptoms.

## 2014-03-30 NOTE — Telephone Encounter (Signed)
The Citalopram  for nervousness is not working for the patient.

## 2014-03-30 NOTE — Telephone Encounter (Signed)
Confirm she is taking 10mg  1/2 tablet q day.  If so, then have her increase to one whole tablet q day.  Notify us if persistent problems.

## 2014-03-30 NOTE — Telephone Encounter (Signed)
Please advise-pt was seen on 03/17/14

## 2014-03-31 ENCOUNTER — Telehealth: Payer: Self-pay | Admitting: Internal Medicine

## 2014-03-31 DIAGNOSIS — M545 Low back pain, unspecified: Secondary | ICD-10-CM | POA: Diagnosis not present

## 2014-03-31 DIAGNOSIS — IMO0002 Reserved for concepts with insufficient information to code with codable children: Secondary | ICD-10-CM | POA: Diagnosis not present

## 2014-03-31 NOTE — Telephone Encounter (Signed)
I am ok with her trying the prednisone and see if this helps with the pain.  If not, then if try gabapentin, would rec a low dose (i.e., 100mg  q hs first and then titrate up).  Would try prednisone first.

## 2014-03-31 NOTE — Telephone Encounter (Signed)
Left vm.  Would like to speak with Dr. Nicki Reaper about appt pt had with Dr. Yves Dill today.  Had x-rays.  He is going to put her on steroids x12 days.  Dr. Lubertha South wanted them to check with Dr. Nicki Reaper about gabapentin or Neurontin that the pt may be able to take.  Asking for a call.

## 2014-03-31 NOTE — Telephone Encounter (Signed)
Please advise if you need more information first.

## 2014-03-31 NOTE — Telephone Encounter (Signed)
Daughter, [name] notified

## 2014-04-29 ENCOUNTER — Ambulatory Visit: Payer: Self-pay | Admitting: Oncology

## 2014-05-08 ENCOUNTER — Ambulatory Visit: Payer: Self-pay | Admitting: Oncology

## 2014-05-08 DIAGNOSIS — M549 Dorsalgia, unspecified: Secondary | ICD-10-CM | POA: Diagnosis not present

## 2014-05-08 DIAGNOSIS — G8929 Other chronic pain: Secondary | ICD-10-CM | POA: Diagnosis not present

## 2014-05-08 DIAGNOSIS — C8589 Other specified types of non-Hodgkin lymphoma, extranodal and solid organ sites: Secondary | ICD-10-CM | POA: Diagnosis not present

## 2014-05-08 DIAGNOSIS — E039 Hypothyroidism, unspecified: Secondary | ICD-10-CM | POA: Diagnosis not present

## 2014-05-08 DIAGNOSIS — M129 Arthropathy, unspecified: Secondary | ICD-10-CM | POA: Diagnosis not present

## 2014-05-08 DIAGNOSIS — G510 Bell's palsy: Secondary | ICD-10-CM | POA: Diagnosis not present

## 2014-05-08 DIAGNOSIS — R5381 Other malaise: Secondary | ICD-10-CM | POA: Diagnosis not present

## 2014-05-08 DIAGNOSIS — F411 Generalized anxiety disorder: Secondary | ICD-10-CM | POA: Diagnosis not present

## 2014-05-08 DIAGNOSIS — I10 Essential (primary) hypertension: Secondary | ICD-10-CM | POA: Diagnosis not present

## 2014-05-08 DIAGNOSIS — R5383 Other fatigue: Secondary | ICD-10-CM | POA: Diagnosis not present

## 2014-05-08 DIAGNOSIS — Z8781 Personal history of (healed) traumatic fracture: Secondary | ICD-10-CM | POA: Diagnosis not present

## 2014-05-08 DIAGNOSIS — E785 Hyperlipidemia, unspecified: Secondary | ICD-10-CM | POA: Diagnosis not present

## 2014-05-08 DIAGNOSIS — K219 Gastro-esophageal reflux disease without esophagitis: Secondary | ICD-10-CM | POA: Diagnosis not present

## 2014-05-08 DIAGNOSIS — Z79899 Other long term (current) drug therapy: Secondary | ICD-10-CM | POA: Diagnosis not present

## 2014-05-08 LAB — COMPREHENSIVE METABOLIC PANEL
ALBUMIN: 3.6 g/dL (ref 3.4–5.0)
ALT: 20 U/L (ref 12–78)
ANION GAP: 6 — AB (ref 7–16)
AST: 13 U/L — AB (ref 15–37)
Alkaline Phosphatase: 45 U/L
BILIRUBIN TOTAL: 0.3 mg/dL (ref 0.2–1.0)
BUN: 11 mg/dL (ref 7–18)
CALCIUM: 8.7 mg/dL (ref 8.5–10.1)
Chloride: 103 mmol/L (ref 98–107)
Co2: 30 mmol/L (ref 21–32)
Creatinine: 0.83 mg/dL (ref 0.60–1.30)
EGFR (Non-African Amer.): >60
Glucose: 89 mg/dL (ref 65–99)
OSMOLALITY: 276 (ref 275–301)
POTASSIUM: 4 mmol/L (ref 3.5–5.1)
SODIUM: 139 mmol/L (ref 136–145)
Total Protein: 6.6 g/dL (ref 6.4–8.2)

## 2014-05-08 LAB — CBC CANCER CENTER
BASOS PCT: 0.8 %
Basophil #: 0 x10 3/mm (ref 0.0–0.1)
EOS ABS: 0.1 x10 3/mm (ref 0.0–0.7)
Eosinophil %: 1.3 %
HCT: 36.4 % (ref 35.0–47.0)
HGB: 11.9 g/dL — ABNORMAL LOW (ref 12.0–16.0)
LYMPHS ABS: 0.9 x10 3/mm — AB (ref 1.0–3.6)
LYMPHS PCT: 14.3 %
MCH: 31.5 pg (ref 26.0–34.0)
MCHC: 32.6 g/dL (ref 32.0–36.0)
MCV: 97 fL (ref 80–100)
Monocyte #: 0.7 x10 3/mm (ref 0.2–0.9)
Monocyte %: 11.8 %
NEUTROS ABS: 4.5 x10 3/mm (ref 1.4–6.5)
Neutrophil %: 71.8 %
Platelet: 298 x10 3/mm (ref 150–440)
RBC: 3.77 10*6/uL — ABNORMAL LOW (ref 3.80–5.20)
RDW: 13.4 % (ref 11.5–14.5)
WBC: 6.2 x10 3/mm (ref 3.6–11.0)

## 2014-05-08 LAB — LACTATE DEHYDROGENASE: LDH: 176 U/L (ref 81–246)

## 2014-05-11 ENCOUNTER — Encounter: Payer: Self-pay | Admitting: Internal Medicine

## 2014-05-11 ENCOUNTER — Ambulatory Visit (INDEPENDENT_AMBULATORY_CARE_PROVIDER_SITE_OTHER): Payer: Medicare Other | Admitting: Internal Medicine

## 2014-05-11 VITALS — BP 130/60 | HR 64 | Temp 98.5°F | Ht 63.0 in | Wt 145.2 lb

## 2014-05-11 DIAGNOSIS — N2889 Other specified disorders of kidney and ureter: Secondary | ICD-10-CM

## 2014-05-11 DIAGNOSIS — K219 Gastro-esophageal reflux disease without esophagitis: Secondary | ICD-10-CM | POA: Diagnosis not present

## 2014-05-11 DIAGNOSIS — E039 Hypothyroidism, unspecified: Secondary | ICD-10-CM

## 2014-05-11 DIAGNOSIS — C8590 Non-Hodgkin lymphoma, unspecified, unspecified site: Secondary | ICD-10-CM

## 2014-05-11 DIAGNOSIS — B029 Zoster without complications: Secondary | ICD-10-CM

## 2014-05-11 DIAGNOSIS — N289 Disorder of kidney and ureter, unspecified: Secondary | ICD-10-CM

## 2014-05-11 DIAGNOSIS — M79609 Pain in unspecified limb: Secondary | ICD-10-CM

## 2014-05-11 DIAGNOSIS — C8589 Other specified types of non-Hodgkin lymphoma, extranodal and solid organ sites: Secondary | ICD-10-CM

## 2014-05-11 DIAGNOSIS — I10 Essential (primary) hypertension: Secondary | ICD-10-CM

## 2014-05-11 DIAGNOSIS — E7800 Pure hypercholesterolemia, unspecified: Secondary | ICD-10-CM

## 2014-05-11 DIAGNOSIS — M79604 Pain in right leg: Secondary | ICD-10-CM

## 2014-05-11 MED ORDER — TRAMADOL HCL 50 MG PO TABS: 50.0000 mg | ORAL_TABLET | Freq: Every evening | ORAL | Status: DC | PRN

## 2014-05-11 NOTE — Progress Notes (Signed)
Pre visit review using our clinic review tool, if applicable. No additional management support is needed unless otherwise documented below in the visit note. 

## 2014-05-12 ENCOUNTER — Other Ambulatory Visit: Payer: Self-pay | Admitting: *Deleted

## 2014-05-12 MED ORDER — OMEPRAZOLE 40 MG PO CPDR: 40.0000 mg | DELAYED_RELEASE_CAPSULE | Freq: Every day | ORAL | Status: DC

## 2014-05-17 ENCOUNTER — Encounter: Payer: Self-pay | Admitting: Internal Medicine

## 2014-05-17 NOTE — Assessment & Plan Note (Signed)
States symptoms controlled.  Declines any further evaluation and w/up.  Follow.

## 2014-05-17 NOTE — Assessment & Plan Note (Signed)
Blood pressure is under good control. Same medication regimen.  Follow metabolic panel.    

## 2014-05-17 NOTE — Assessment & Plan Note (Signed)
Persistent scarring.  Some persistent pain.  Better.  Request refill tramadol.  Takes q hs prn.  Tolerates with no adverse effects.  Follow.

## 2014-05-17 NOTE — Assessment & Plan Note (Signed)
Better.  Tramadol refilled.  See above.

## 2014-05-17 NOTE — Assessment & Plan Note (Signed)
Low cholesterol diet.  Check lipid panel with next fasting labs.  She is not fasting today.  Unable to check.

## 2014-05-17 NOTE — Progress Notes (Signed)
Subjective:    Patient ID: Tasha Avila, female    DOB: 1928-04-22, 80 y.o.   MRN: [id number]  Leg Pain   80 year old female with past history of hypertension, anemia, hypercholesterolemia and hypothyroidism who is s/p lap cholecystectomy 5/11.  A renal mass was found incedentally on the ultrasound.  Subsequent CT revealed increased lymphadenopathy.  She underwent bx -negative.  Sill concern regarding possible lymphoma.  She comes in today for a scheduled follow up.  She saw Raquel Rey on 01/23/14.  Diagnosed with shingles.  Prescribed valtrex.  Still having some residual pain.  Is much better.  Able to get around better.  No further falls.  She has a previous history of back pain.  She still has residual scars and lesions from her shingles flare.  Overall better.  Getting around better.  Eating and drinking well. No nausea or vomiting.   No chest pain or tightness.  Breathing stable.  No bowel change.   Has a walker.   We discussed using this regularly to help with support.  Tylenol and advil help when she takes them.  Anxiety better.  Saw Dr Oliva Bustard last week.  States everything checked out fine.  Did labs.  Obtain results.  Was previously given a prednisone taper, which helped significantly.  Asking for refill of tramadol.  Only takes prn at night.  Tolerates.      Past Medical History  Diagnosis Date  . Anemia   . Hypothyroidism   . Hypercholesterolemia   . Hypertension   . GERD (gastroesophageal reflux disease)     Outpatient Encounter Prescriptions as of 05/11/2014  Medication Sig  . acetaminophen (TYLENOL) 500 MG tablet Take 500 mg by mouth every 6 (six) hours as needed.  . citalopram (CELEXA) 10 MG tablet 1/2 tablet q day  . Homeopathic Products ([employer] LEG CRAMPS PAIN RELIEF PO) Take by mouth as needed.  Marland Kitchen levothyroxine (SYNTHROID, LEVOTHROID) 75 MCG tablet Take 1 tablet (75 mcg total) by mouth daily.  Marland Kitchen lisinopril (PRINIVIL,ZESTRIL) 10 MG tablet Take 1 tablet (10 mg total) by mouth daily.   . [DISCONTINUED] pantoprazole (PROTONIX) 40 MG tablet TAKE ONE TABLET BY MOUTH ONCE DAILY  . traMADol (ULTRAM) 50 MG tablet Take 1 tablet (50 mg total) by mouth at bedtime as needed.    Review of Systems Patient denies any headache.  No significant sinus or allergy symptoms.  No chest pain, tightness or palpitations.  No increased shortness of breath, cough or congestion.  Breathing stable.  No nausea or vomiting.  No abdominal pain or cramping.  Bowels stable.  No urinary problems now. Leg pain and symptoms as outlined.  Better.  Request refill for tramadol.  Getting around better.  No further falls.           Objective:   Physical Exam  Filed Vitals:   05/11/14 1416  BP: 130/60  Pulse: 64  Temp: 98.5 F (36.9 C)   Blood pressure recheck:  85/67  80 year old female in no acute distress.  NECK:  Supple.  Nontender.  No audible bruit.  Palpable right neck nodule.  HEART:  Appears to be regular. LUNGS:  No crackles or wheezing audible.  Respirations even and unlabored.  RADIAL PULSE:  Equal bilaterally.    ABDOMEN:  Soft, nontender.  Bowel sounds present and normal.  No audible abdominal bruit.    EXTREMITIES:  No increased edema present.  Stable.  SKIN:  Scarring and lesions right leg c/w previous shingles  rash.    MSK/NEURO:  Able to move around and change positions better.  Decreased pain.              Assessment & Plan:  PREVIOUS KNEE PAIN  Previous ultrasound revealed a Bakers cyst.  Given persistent problems was referred to Dr Marry Guan.  Saw Dr Marry Guan.  Diagnosed with arthritis.  Stable.   Follow.   DERMATOLOGY.  Perirectal and perivaginal irritation improved with Nystatin cream as needed.      HEALTH MAINTENANCE.  She is s/p hysterectomy.  Declines further GI evaluatoin., bone density and mammogram.    I spent more than 25 minutes with the patient and more than 50% of the time was spent in consultation regarding the above.

## 2014-05-17 NOTE — Assessment & Plan Note (Signed)
Found incidentally.  Evaluated by Urology and Dr Oliva Bustard.  CT revealed lymphadenopathy.  Continue follow up with Dr Oliva Bustard.  States just checked last week and reports everything "ok".  Obtain records and labs.

## 2014-05-17 NOTE — Assessment & Plan Note (Signed)
On replacement.  Follow tsh.  

## 2014-05-17 NOTE — Assessment & Plan Note (Signed)
Previous neck nodule removed and biopsy revealed lymphoma.  S/p treatment.  Seeing Dr Oliva Bustard.  Just evaluated.  States everything "ok".  Obtain records.

## 2014-05-27 DIAGNOSIS — H2510 Age-related nuclear cataract, unspecified eye: Secondary | ICD-10-CM | POA: Diagnosis not present

## 2014-06-03 ENCOUNTER — Ambulatory Visit: Payer: Self-pay | Admitting: Oncology

## 2014-07-16 DIAGNOSIS — J069 Acute upper respiratory infection, unspecified: Secondary | ICD-10-CM | POA: Diagnosis not present

## 2014-07-22 ENCOUNTER — Other Ambulatory Visit: Payer: Self-pay | Admitting: Internal Medicine

## 2014-07-24 ENCOUNTER — Ambulatory Visit: Payer: Self-pay | Admitting: Physician Assistant

## 2014-07-24 DIAGNOSIS — K029 Dental caries, unspecified: Secondary | ICD-10-CM | POA: Diagnosis not present

## 2014-07-24 DIAGNOSIS — E785 Hyperlipidemia, unspecified: Secondary | ICD-10-CM | POA: Diagnosis not present

## 2014-07-24 DIAGNOSIS — K219 Gastro-esophageal reflux disease without esophagitis: Secondary | ICD-10-CM | POA: Diagnosis not present

## 2014-07-24 DIAGNOSIS — E039 Hypothyroidism, unspecified: Secondary | ICD-10-CM | POA: Diagnosis not present

## 2014-07-24 DIAGNOSIS — I10 Essential (primary) hypertension: Secondary | ICD-10-CM | POA: Diagnosis not present

## 2014-07-24 DIAGNOSIS — Z79899 Other long term (current) drug therapy: Secondary | ICD-10-CM | POA: Diagnosis not present

## 2014-07-24 DIAGNOSIS — K047 Periapical abscess without sinus: Secondary | ICD-10-CM | POA: Diagnosis not present

## 2014-07-30 ENCOUNTER — Ambulatory Visit: Payer: Self-pay | Admitting: Physician Assistant

## 2014-07-30 DIAGNOSIS — K219 Gastro-esophageal reflux disease without esophagitis: Secondary | ICD-10-CM | POA: Diagnosis not present

## 2014-07-30 DIAGNOSIS — Z79899 Other long term (current) drug therapy: Secondary | ICD-10-CM | POA: Diagnosis not present

## 2014-07-30 DIAGNOSIS — E785 Hyperlipidemia, unspecified: Secondary | ICD-10-CM | POA: Diagnosis not present

## 2014-07-30 DIAGNOSIS — I10 Essential (primary) hypertension: Secondary | ICD-10-CM | POA: Diagnosis not present

## 2014-07-30 DIAGNOSIS — R6884 Jaw pain: Secondary | ICD-10-CM | POA: Diagnosis not present

## 2014-07-30 DIAGNOSIS — K089 Disorder of teeth and supporting structures, unspecified: Secondary | ICD-10-CM | POA: Diagnosis not present

## 2014-07-30 DIAGNOSIS — E039 Hypothyroidism, unspecified: Secondary | ICD-10-CM | POA: Diagnosis not present

## 2014-07-30 DIAGNOSIS — Z9089 Acquired absence of other organs: Secondary | ICD-10-CM | POA: Diagnosis not present

## 2014-09-22 ENCOUNTER — Ambulatory Visit (INDEPENDENT_AMBULATORY_CARE_PROVIDER_SITE_OTHER): Payer: Medicare Other | Admitting: Internal Medicine

## 2014-09-22 ENCOUNTER — Encounter: Payer: Self-pay | Admitting: Internal Medicine

## 2014-09-22 ENCOUNTER — Encounter (INDEPENDENT_AMBULATORY_CARE_PROVIDER_SITE_OTHER): Payer: Self-pay

## 2014-09-22 VITALS — BP 120/50 | HR 73 | Temp 97.8°F | Ht 63.0 in | Wt 143.0 lb

## 2014-09-22 DIAGNOSIS — E039 Hypothyroidism, unspecified: Secondary | ICD-10-CM | POA: Diagnosis not present

## 2014-09-22 DIAGNOSIS — I10 Essential (primary) hypertension: Secondary | ICD-10-CM | POA: Diagnosis not present

## 2014-09-22 DIAGNOSIS — C8590 Non-Hodgkin lymphoma, unspecified, unspecified site: Secondary | ICD-10-CM | POA: Diagnosis not present

## 2014-09-22 DIAGNOSIS — E7800 Pure hypercholesterolemia, unspecified: Secondary | ICD-10-CM

## 2014-09-22 DIAGNOSIS — R2681 Unsteadiness on feet: Secondary | ICD-10-CM | POA: Diagnosis not present

## 2014-09-22 DIAGNOSIS — N2889 Other specified disorders of kidney and ureter: Secondary | ICD-10-CM | POA: Diagnosis not present

## 2014-09-22 DIAGNOSIS — K219 Gastro-esophageal reflux disease without esophagitis: Secondary | ICD-10-CM

## 2014-09-22 DIAGNOSIS — B029 Zoster without complications: Secondary | ICD-10-CM

## 2014-09-22 DIAGNOSIS — R351 Nocturia: Secondary | ICD-10-CM

## 2014-09-22 LAB — CBC WITH DIFFERENTIAL/PLATELET
BASOS ABS: 0 10*3/uL (ref 0.0–0.1)
Basophils Relative: 0.8 % (ref 0.0–3.0)
Eosinophils Absolute: 0.2 10*3/uL (ref 0.0–0.7)
Eosinophils Relative: 4.1 % (ref 0.0–5.0)
HCT: 36.1 % (ref 36.0–46.0)
Hemoglobin: 11.5 g/dL — ABNORMAL LOW (ref 12.0–15.0)
Lymphocytes Relative: 25.8 % (ref 12.0–46.0)
Lymphs Abs: 1.5 10*3/uL (ref 0.7–4.0)
MCHC: 31.9 g/dL (ref 30.0–36.0)
MCV: 94.2 fl (ref 78.0–100.0)
MONOS PCT: 11.6 % (ref 3.0–12.0)
Monocytes Absolute: 0.7 10*3/uL (ref 0.1–1.0)
NEUTROS PCT: 57.7 % (ref 43.0–77.0)
Neutro Abs: 3.3 10*3/uL (ref 1.4–7.7)
PLATELETS: 272 10*3/uL (ref 150.0–400.0)
RBC: 3.83 Mil/uL — ABNORMAL LOW (ref 3.87–5.11)
RDW: 14.3 % (ref 11.5–15.5)
WBC: 5.7 10*3/uL (ref 4.0–10.5)

## 2014-09-22 LAB — BASIC METABOLIC PANEL
BUN: 12 mg/dL (ref 6–23)
CO2: 25 mEq/L (ref 19–32)
Calcium: 8.9 mg/dL (ref 8.4–10.5)
Chloride: 106 mEq/L (ref 96–112)
Creatinine, Ser: 0.7 mg/dL (ref 0.4–1.2)
GFR: 89.13 mL/min (ref >60.00)
Glucose, Bld: 80 mg/dL (ref 70–99)
Potassium: 4.2 mEq/L (ref 3.5–5.1)
SODIUM: 142 meq/L (ref 135–145)

## 2014-09-22 LAB — HEPATIC FUNCTION PANEL
ALT: 12 U/L (ref 0–35)
AST: 16 U/L (ref 0–37)
Albumin: 3.3 g/dL — ABNORMAL LOW (ref 3.5–5.2)
Alkaline Phosphatase: 45 U/L (ref 39–117)
BILIRUBIN TOTAL: 0.7 mg/dL (ref 0.2–1.2)
Bilirubin, Direct: 0.1 mg/dL (ref 0.0–0.3)
Total Protein: 6.9 g/dL (ref 6.0–8.3)

## 2014-09-22 LAB — URINALYSIS, ROUTINE W REFLEX MICROSCOPIC
BILIRUBIN URINE: NEGATIVE
Hgb urine dipstick: NEGATIVE
Ketones, ur: NEGATIVE
Nitrite: NEGATIVE
PH: 6.5 (ref 5.0–8.0)
RBC / HPF: NONE SEEN (ref 0–?)
Specific Gravity, Urine: 1.01 (ref 1.000–1.030)
TOTAL PROTEIN, URINE-UPE24: NEGATIVE
Urine Glucose: NEGATIVE
Urobilinogen, UA: 0.2 (ref 0.0–1.0)

## 2014-09-22 LAB — TSH: TSH: 0.51 u[IU]/mL (ref 0.35–4.50)

## 2014-09-22 MED ORDER — PANTOPRAZOLE SODIUM 40 MG PO TBEC: 40.0000 mg | DELAYED_RELEASE_TABLET | Freq: Every day | ORAL | Status: DC

## 2014-09-22 NOTE — Progress Notes (Signed)
Pre visit review using our clinic review tool, if applicable. No additional management support is needed unless otherwise documented below in the visit note. 

## 2014-09-23 ENCOUNTER — Other Ambulatory Visit: Payer: Self-pay | Admitting: Internal Medicine

## 2014-09-23 ENCOUNTER — Encounter: Payer: Self-pay | Admitting: *Deleted

## 2014-09-23 DIAGNOSIS — D649 Anemia, unspecified: Secondary | ICD-10-CM

## 2014-09-23 NOTE — Progress Notes (Signed)
Orders placed for f/u labs.  

## 2014-09-25 ENCOUNTER — Other Ambulatory Visit: Payer: Self-pay | Admitting: *Deleted

## 2014-09-25 LAB — CULTURE, URINE COMPREHENSIVE

## 2014-09-25 MED ORDER — CIPROFLOXACIN HCL 250 MG PO TABS: 250.0000 mg | ORAL_TABLET | Freq: Two times a day (BID) | ORAL | Status: AC

## 2014-09-27 ENCOUNTER — Encounter: Payer: Self-pay | Admitting: Internal Medicine

## 2014-09-27 NOTE — Assessment & Plan Note (Signed)
Blood pressure is under good control. Same medication regimen.  Follow metabolic panel.    

## 2014-09-27 NOTE — Assessment & Plan Note (Signed)
Low cholesterol diet.  Check lipid panel with next fasting labs.  

## 2014-09-27 NOTE — Assessment & Plan Note (Signed)
States symptoms controlled.  Declines any further evaluation and w/up.  Follow.

## 2014-09-27 NOTE — Assessment & Plan Note (Signed)
Previous neck nodule removed and biopsy revealed lymphoma.  S/p treatment.  Seeing Dr Oliva Bustard.

## 2014-09-27 NOTE — Progress Notes (Signed)
Subjective:    Patient ID: Tasha Avila, female    DOB: 1928/04/25, 80 y.o.   MRN: [id number]  HPI 80 year old female with past history of hypertension, anemia, hypercholesterolemia and hypothyroidism who is s/p lap cholecystectomy 5/11.  A renal mass was found incedentally on the ultrasound.  Subsequent CT revealed increased lymphadenopathy.  She underwent bx -negative.  Sill concern regarding possible lymphoma.  She comes in today for a scheduled follow up.    No chest pain or tightness.  Breathing stable.  No nausea or vomiting. No bowel change.  Overall feels better.  Recently had shingles.  Pain better.  Saw ENT.  Had abscessed tooth.  Has had to have four pulled.  Feels better.  Due f/u with her dentist in 4/15.     Past Medical History  Diagnosis Date  . Anemia   . Hypothyroidism   . Hypercholesterolemia   . Hypertension   . GERD (gastroesophageal reflux disease)     Outpatient Encounter Prescriptions as of 09/22/2014  Medication Sig  . acetaminophen (TYLENOL) 500 MG tablet Take 500 mg by mouth every 6 (six) hours as needed.  . Homeopathic Products ([employer] LEG CRAMPS PAIN RELIEF PO) Take by mouth as needed.  Marland Kitchen levothyroxine (SYNTHROID, LEVOTHROID) 75 MCG tablet TAKE ONE TABLET BY MOUTH ONCE DAILY  . lisinopril (PRINIVIL,ZESTRIL) 10 MG tablet Take 1 tablet (10 mg total) by mouth daily.  . pantoprazole (PROTONIX) 40 MG tablet Take 1 tablet (40 mg total) by mouth daily.  . traMADol (ULTRAM) 50 MG tablet Take 1 tablet (50 mg total) by mouth at bedtime as needed.  . [DISCONTINUED] pantoprazole (PROTONIX) 40 MG tablet Take 40 mg by mouth daily.  . citalopram (CELEXA) 10 MG tablet 1/2 tablet q day  . [DISCONTINUED] omeprazole (PRILOSEC) 40 MG capsule Take 1 capsule (40 mg total) by mouth daily.    Review of Systems Patient denies any headache.  No significant sinus symptoms currently.   No chest pain, tightness or palpitations.  No increased shortness of breath, cough or  congestion.  Breathing stable.  No nausea or vomiting.  No abdominal pain or cramping.  Bowels stable.  Was questioning whether or not she could have a uti.  Some burining.  Leg pain much better.  Ambulating better.  No recent falls.  Had four teeth pulled.  Eating better.  Feels better.  Accompanied by her daughter.  History obtained from both of them.         Objective:   Physical Exam  Filed Vitals:   09/22/14 1003  BP: 120/50  Pulse: 73  Temp: 97.8 F (67.62 C)   80 year old female in no acute distress.   HEENT:  Nares- clear.  Oropharynx - without lesions. NECK:  Supple.  Nontender.  No audible bruit.  HEART:  Appears to be regular. LUNGS:  No crackles or wheezing audible.  Respirations even and unlabored.  RADIAL PULSE:  Equal bilaterally.  ABDOMEN:  Soft, nontender.  Bowel sounds present and normal.  No audible abdominal bruit.    EXTREMITIES:  No increased edema present.  DP pulses palpable and equal bilaterally.             Assessment & Plan:  KNEE PAIN/LEG PAIN.  Previous ultrasound revealed a Bakers cyst.  Given persistent problems was referred to Dr Marry Guan.  Saw Dr Marry Guan.  Diagnosed with arthritis.  Stable.   Follow.    MSK.  Has persistent back pain.  Declines any further  w/up.  Follow.  Tylenol prn.  Pain better.   DERMATOLOGY.  Persistent intermittent perirectal and perivaginal irritation.  Nystatin cream as needed.  Improved currently.     HEALTH MAINTENANCE.  She is s/p hysterectomy.  Declines further GI evaluatoin., bone density and mammogram.    Problem List Items Addressed This Visit   GERD (gastroesophageal reflux disease)     States symptoms controlled.  Declines any further evaluation and w/up.  Follow.       Relevant Medications      pantoprazole (PROTONIX) EC tablet   Hypercholesterolemia     Low cholesterol diet.  Check lipid panel with next fasting labs.       Hypertension - Primary     Blood pressure is under good control.  Same medication  regimen.  Follow metabolic panel.      Relevant Orders      Hepatic function panel (Completed)      Basic metabolic panel (Completed)   Hypothyroidism     On replacement.  Follow tsh.      Relevant Orders      TSH (Completed)   Lymphoma     Previous neck nodule removed and biopsy revealed lymphoma.  S/p treatment.  Seeing Dr Oliva Bustard.       Relevant Orders      CBC with Differential (Completed)   Nocturia   Relevant Orders      CULTURE, URINE COMPREHENSIVE (Completed)   Renal mass     Found incidentally.  Evaluated by Urology and Dr Oliva Bustard.  CT revealed lymphadenopathy.  Continue follow up with Dr Oliva Bustard.       Relevant Orders      Urinalysis, Routine w reflex microscopic (Completed)   Shingles rash     Pain better.  Follow.       Unsteady gait     Improved.  No recent falls.  Follow.

## 2014-09-27 NOTE — Assessment & Plan Note (Signed)
Improved.  No recent falls.  Follow.

## 2014-09-27 NOTE — Assessment & Plan Note (Signed)
Found incidentally.  Evaluated by Urology and Dr Oliva Bustard.  CT revealed lymphadenopathy.  Continue follow up with Dr Oliva Bustard.

## 2014-09-27 NOTE — Assessment & Plan Note (Signed)
On replacement.  Follow tsh.  

## 2014-09-27 NOTE — Assessment & Plan Note (Signed)
Pain better.  Follow.  

## 2014-10-01 ENCOUNTER — Telehealth: Payer: Self-pay | Admitting: *Deleted

## 2014-10-01 ENCOUNTER — Other Ambulatory Visit (INDEPENDENT_AMBULATORY_CARE_PROVIDER_SITE_OTHER): Payer: Medicare Other

## 2014-10-01 DIAGNOSIS — D649 Anemia, unspecified: Secondary | ICD-10-CM | POA: Diagnosis not present

## 2014-10-01 DIAGNOSIS — E7800 Pure hypercholesterolemia, unspecified: Secondary | ICD-10-CM

## 2014-10-01 LAB — CBC WITH DIFFERENTIAL/PLATELET
BASOS ABS: 0 10*3/uL (ref 0.0–0.1)
Basophils Relative: 0.7 % (ref 0.0–3.0)
Eosinophils Absolute: 0.3 10*3/uL (ref 0.0–0.7)
Eosinophils Relative: 3.8 % (ref 0.0–5.0)
HCT: 36.3 % (ref 36.0–46.0)
Hemoglobin: 11.7 g/dL — ABNORMAL LOW (ref 12.0–15.0)
LYMPHS ABS: 1.6 10*3/uL (ref 0.7–4.0)
LYMPHS PCT: 21.3 % (ref 12.0–46.0)
MCHC: 32.2 g/dL (ref 30.0–36.0)
MCV: 94.7 fl (ref 78.0–100.0)
Monocytes Absolute: 0.7 10*3/uL (ref 0.1–1.0)
Monocytes Relative: 8.9 % (ref 3.0–12.0)
Neutro Abs: 4.9 10*3/uL (ref 1.4–7.7)
Neutrophils Relative %: 65.3 % (ref 43.0–77.0)
PLATELETS: 294 10*3/uL (ref 150.0–400.0)
RBC: 3.84 Mil/uL — ABNORMAL LOW (ref 3.87–5.11)
RDW: 14.6 % (ref 11.5–15.5)
WBC: 7.5 10*3/uL (ref 4.0–10.5)

## 2014-10-01 LAB — FERRITIN: FERRITIN: 25.8 ng/mL (ref 10.0–291.0)

## 2014-10-01 NOTE — Telephone Encounter (Signed)
Order placed for fasting cholesterol.  

## 2014-10-01 NOTE — Telephone Encounter (Signed)
Order placed for cholesterol.

## 2014-10-01 NOTE — Addendum Note (Signed)
Addended by: Karlene Einstein D on: 10/01/2014 09:17 AM   Modules accepted: Orders

## 2014-10-01 NOTE — Telephone Encounter (Signed)
Pt thought she was suppose to get a lipid panel as well?

## 2014-10-02 LAB — IBC PANEL
Iron: 83 ug/dL (ref 42–145)
Saturation Ratios: 24.3 % (ref 20.0–50.0)
Transferrin: 243.5 mg/dL (ref 212.0–360.0)

## 2014-10-02 LAB — LIPID PANEL
CHOLESTEROL: 190 mg/dL (ref 0–200)
HDL: 72.4 mg/dL (ref >39.00)
LDL Cholesterol: 102 mg/dL — ABNORMAL HIGH (ref 0–99)
NonHDL: 117.6
TRIGLYCERIDES: 77 mg/dL (ref 0.0–149.0)
Total CHOL/HDL Ratio: 3
VLDL: 15.4 mg/dL (ref 0.0–40.0)

## 2014-10-05 ENCOUNTER — Encounter: Payer: Self-pay | Admitting: *Deleted

## 2014-10-05 ENCOUNTER — Other Ambulatory Visit: Payer: Self-pay | Admitting: Internal Medicine

## 2014-10-05 DIAGNOSIS — D649 Anemia, unspecified: Secondary | ICD-10-CM

## 2014-10-05 NOTE — Progress Notes (Signed)
Order placed for f/u cbc.   

## 2014-10-15 ENCOUNTER — Other Ambulatory Visit: Payer: Self-pay | Admitting: Internal Medicine

## 2014-11-13 ENCOUNTER — Ambulatory Visit: Payer: Self-pay | Admitting: Oncology

## 2014-11-13 DIAGNOSIS — E039 Hypothyroidism, unspecified: Secondary | ICD-10-CM | POA: Diagnosis not present

## 2014-11-13 DIAGNOSIS — Z90710 Acquired absence of both cervix and uterus: Secondary | ICD-10-CM | POA: Diagnosis not present

## 2014-11-13 DIAGNOSIS — I10 Essential (primary) hypertension: Secondary | ICD-10-CM | POA: Diagnosis not present

## 2014-11-13 DIAGNOSIS — R5383 Other fatigue: Secondary | ICD-10-CM | POA: Diagnosis not present

## 2014-11-13 DIAGNOSIS — M129 Arthropathy, unspecified: Secondary | ICD-10-CM | POA: Diagnosis not present

## 2014-11-13 DIAGNOSIS — Z9221 Personal history of antineoplastic chemotherapy: Secondary | ICD-10-CM | POA: Diagnosis not present

## 2014-11-13 DIAGNOSIS — E785 Hyperlipidemia, unspecified: Secondary | ICD-10-CM | POA: Diagnosis not present

## 2014-11-13 DIAGNOSIS — B029 Zoster without complications: Secondary | ICD-10-CM | POA: Diagnosis not present

## 2014-11-13 DIAGNOSIS — G510 Bell's palsy: Secondary | ICD-10-CM | POA: Diagnosis not present

## 2014-11-13 DIAGNOSIS — K219 Gastro-esophageal reflux disease without esophagitis: Secondary | ICD-10-CM | POA: Diagnosis not present

## 2014-11-13 DIAGNOSIS — F419 Anxiety disorder, unspecified: Secondary | ICD-10-CM | POA: Diagnosis not present

## 2014-11-13 DIAGNOSIS — Z8572 Personal history of non-Hodgkin lymphomas: Secondary | ICD-10-CM | POA: Diagnosis not present

## 2014-11-13 DIAGNOSIS — R531 Weakness: Secondary | ICD-10-CM | POA: Diagnosis not present

## 2014-11-13 DIAGNOSIS — Z9049 Acquired absence of other specified parts of digestive tract: Secondary | ICD-10-CM | POA: Diagnosis not present

## 2014-11-13 DIAGNOSIS — M549 Dorsalgia, unspecified: Secondary | ICD-10-CM | POA: Diagnosis not present

## 2014-11-13 DIAGNOSIS — Z79899 Other long term (current) drug therapy: Secondary | ICD-10-CM | POA: Diagnosis not present

## 2014-11-13 LAB — COMPREHENSIVE METABOLIC PANEL
ANION GAP: 8 (ref 7–16)
AST: 16 U/L (ref 15–37)
Albumin: 3.6 g/dL (ref 3.4–5.0)
Alkaline Phosphatase: 61 U/L
BUN: 15 mg/dL (ref 7–18)
Bilirubin,Total: 0.5 mg/dL (ref 0.2–1.0)
Calcium, Total: 8.9 mg/dL (ref 8.5–10.1)
Chloride: 104 mmol/L (ref 98–107)
Co2: 29 mmol/L (ref 21–32)
Creatinine: 0.83 mg/dL (ref 0.60–1.30)
EGFR (African American): >60
EGFR (Non-African Amer.): >60
GLUCOSE: 105 mg/dL — AB (ref 65–99)
Osmolality: 282 (ref 275–301)
POTASSIUM: 4.1 mmol/L (ref 3.5–5.1)
SGPT (ALT): 21 U/L
SODIUM: 141 mmol/L (ref 136–145)
Total Protein: 7.3 g/dL (ref 6.4–8.2)

## 2014-11-13 LAB — CBC CANCER CENTER
Basophil #: 0.1 x10 3/mm (ref 0.0–0.1)
Basophil %: 1.2 %
Eosinophil #: 0.3 x10 3/mm (ref 0.0–0.7)
Eosinophil %: 4.8 %
HCT: 38.1 % (ref 35.0–47.0)
HGB: 12.3 g/dL (ref 12.0–16.0)
LYMPHS PCT: 26.1 %
Lymphocyte #: 1.7 x10 3/mm (ref 1.0–3.6)
MCH: 30.3 pg (ref 26.0–34.0)
MCHC: 32.2 g/dL (ref 32.0–36.0)
MCV: 94 fL (ref 80–100)
MONO ABS: 0.6 x10 3/mm (ref 0.2–0.9)
Monocyte %: 9.8 %
NEUTROS ABS: 3.8 x10 3/mm (ref 1.4–6.5)
Neutrophil %: 58.1 %
Platelet: 278 x10 3/mm (ref 150–440)
RBC: 4.05 10*6/uL (ref 3.80–5.20)
RDW: 13.5 % (ref 11.5–14.5)
WBC: 6.5 x10 3/mm (ref 3.6–11.0)

## 2014-11-13 LAB — LACTATE DEHYDROGENASE: LDH: 188 U/L (ref 81–246)

## 2014-11-23 ENCOUNTER — Other Ambulatory Visit (INDEPENDENT_AMBULATORY_CARE_PROVIDER_SITE_OTHER): Payer: Medicare Other

## 2014-11-23 ENCOUNTER — Encounter: Payer: Self-pay | Admitting: *Deleted

## 2014-11-23 DIAGNOSIS — D649 Anemia, unspecified: Secondary | ICD-10-CM

## 2014-11-23 LAB — CBC WITH DIFFERENTIAL/PLATELET
BASOS PCT: 0.4 % (ref 0.0–3.0)
Basophils Absolute: 0 10*3/uL (ref 0.0–0.1)
EOS ABS: 0.2 10*3/uL (ref 0.0–0.7)
EOS PCT: 2.2 % (ref 0.0–5.0)
HEMATOCRIT: 39.6 % (ref 36.0–46.0)
Hemoglobin: 12.8 g/dL (ref 12.0–15.0)
Lymphocytes Relative: 23.1 % (ref 12.0–46.0)
Lymphs Abs: 1.8 10*3/uL (ref 0.7–4.0)
MCHC: 32.3 g/dL (ref 30.0–36.0)
MCV: 93.2 fl (ref 78.0–100.0)
MONO ABS: 0.7 10*3/uL (ref 0.1–1.0)
Monocytes Relative: 9.4 % (ref 3.0–12.0)
Neutro Abs: 5 10*3/uL (ref 1.4–7.7)
Neutrophils Relative %: 64.9 % (ref 43.0–77.0)
Platelets: 312 10*3/uL (ref 150.0–400.0)
RBC: 4.24 Mil/uL (ref 3.87–5.11)
RDW: 13.6 % (ref 11.5–15.5)
WBC: 7.7 10*3/uL (ref 4.0–10.5)

## 2014-12-04 ENCOUNTER — Ambulatory Visit: Payer: Self-pay | Admitting: Oncology

## 2015-01-07 ENCOUNTER — Other Ambulatory Visit: Payer: Self-pay | Admitting: Internal Medicine

## 2015-01-19 ENCOUNTER — Encounter: Payer: Medicare Other | Admitting: Internal Medicine

## 2015-02-02 ENCOUNTER — Other Ambulatory Visit: Payer: Self-pay | Admitting: Internal Medicine

## 2015-03-19 ENCOUNTER — Ambulatory Visit (INDEPENDENT_AMBULATORY_CARE_PROVIDER_SITE_OTHER): Payer: Medicare Other | Admitting: Internal Medicine

## 2015-03-19 ENCOUNTER — Encounter: Payer: Self-pay | Admitting: Internal Medicine

## 2015-03-19 VITALS — BP 132/70 | HR 54 | Temp 97.8°F | Ht 63.0 in | Wt 145.1 lb

## 2015-03-19 DIAGNOSIS — M549 Dorsalgia, unspecified: Secondary | ICD-10-CM | POA: Insufficient documentation

## 2015-03-19 DIAGNOSIS — E7800 Pure hypercholesterolemia, unspecified: Secondary | ICD-10-CM

## 2015-03-19 DIAGNOSIS — R21 Rash and other nonspecific skin eruption: Secondary | ICD-10-CM

## 2015-03-19 DIAGNOSIS — K219 Gastro-esophageal reflux disease without esophagitis: Secondary | ICD-10-CM

## 2015-03-19 DIAGNOSIS — R8290 Unspecified abnormal findings in urine: Secondary | ICD-10-CM | POA: Diagnosis not present

## 2015-03-19 DIAGNOSIS — M545 Low back pain: Secondary | ICD-10-CM | POA: Diagnosis not present

## 2015-03-19 DIAGNOSIS — C8590 Non-Hodgkin lymphoma, unspecified, unspecified site: Secondary | ICD-10-CM

## 2015-03-19 DIAGNOSIS — E039 Hypothyroidism, unspecified: Secondary | ICD-10-CM | POA: Diagnosis not present

## 2015-03-19 DIAGNOSIS — M546 Pain in thoracic spine: Secondary | ICD-10-CM | POA: Insufficient documentation

## 2015-03-19 DIAGNOSIS — I10 Essential (primary) hypertension: Secondary | ICD-10-CM

## 2015-03-19 DIAGNOSIS — R351 Nocturia: Secondary | ICD-10-CM | POA: Diagnosis not present

## 2015-03-19 DIAGNOSIS — Z0000 Encounter for general adult medical examination without abnormal findings: Secondary | ICD-10-CM

## 2015-03-19 DIAGNOSIS — N2889 Other specified disorders of kidney and ureter: Secondary | ICD-10-CM

## 2015-03-19 LAB — BASIC METABOLIC PANEL WITH GFR
BUN: 14 mg/dL (ref 6–23)
CO2: 27 meq/L (ref 19–32)
Calcium: 9.3 mg/dL (ref 8.4–10.5)
Chloride: 105 meq/L (ref 96–112)
Creatinine, Ser: 0.64 mg/dL (ref 0.40–1.20)
GFR: 93.85 mL/min
Glucose, Bld: 90 mg/dL (ref 70–99)
Potassium: 4.3 meq/L (ref 3.5–5.1)
Sodium: 139 meq/L (ref 135–145)

## 2015-03-19 LAB — URINALYSIS, ROUTINE W REFLEX MICROSCOPIC
Bilirubin Urine: NEGATIVE
Ketones, ur: NEGATIVE
Nitrite: NEGATIVE
Specific Gravity, Urine: 1.025
Total Protein, Urine: NEGATIVE
Urine Glucose: NEGATIVE
Urobilinogen, UA: 0.2
pH: 5.5 (ref 5.0–8.0)

## 2015-03-19 LAB — LIPID PANEL
Cholesterol: 216 mg/dL — ABNORMAL HIGH (ref 0–200)
HDL: 73.5 mg/dL
LDL Cholesterol: 123 mg/dL — ABNORMAL HIGH (ref 0–99)
NonHDL: 142.5
Total CHOL/HDL Ratio: 3
Triglycerides: 96 mg/dL (ref 0.0–149.0)
VLDL: 19.2 mg/dL (ref 0.0–40.0)

## 2015-03-19 LAB — HEPATIC FUNCTION PANEL
ALT: 10 U/L (ref 0–35)
AST: 13 U/L (ref 0–37)
Albumin: 3.9 g/dL (ref 3.5–5.2)
Alkaline Phosphatase: 49 U/L (ref 39–117)
Bilirubin, Direct: 0.1 mg/dL (ref 0.0–0.3)
Total Bilirubin: 0.6 mg/dL (ref 0.2–1.2)
Total Protein: 6.8 g/dL (ref 6.0–8.3)

## 2015-03-19 LAB — TSH: TSH: 1.54 u[IU]/mL (ref 0.35–4.50)

## 2015-03-19 MED ORDER — TRIAMCINOLONE ACETONIDE 0.1 % EX CREA: 1.0000 "application " | TOPICAL_CREAM | Freq: Two times a day (BID) | CUTANEOUS | Status: DC

## 2015-03-19 MED ORDER — NYSTATIN 100000 UNIT/GM EX CREA: 1.0000 "application " | TOPICAL_CREAM | Freq: Two times a day (BID) | CUTANEOUS | Status: DC

## 2015-03-19 MED ORDER — PANTOPRAZOLE SODIUM 40 MG PO TBEC: 40.0000 mg | DELAYED_RELEASE_TABLET | Freq: Every day | ORAL | Status: DC

## 2015-03-19 NOTE — Progress Notes (Signed)
Pre visit review using our clinic review tool, if applicable. No additional management support is needed unless otherwise documented below in the visit note. 

## 2015-03-19 NOTE — Progress Notes (Signed)
Patient ID: Tasha Avila, female   DOB: 25-Nov-1928, 81 y.o.   MRN: [id number]   Subjective:    Patient ID: Tasha Avila, female    DOB: 1928/09/30, 81 y.o.   MRN: [id number]  HPI  Patient here for her physical exam.  She is accompanied by her daughter.  History obtained from both of them.  She reports some increased pain in her back.  Has arthritis.  Also concerned that she may have another urinary tract infection.  Rash intermittent - vagina and beneath her breasts.  No nausea or vomiting.  Eating and drinking well.     Past Medical History  Diagnosis Date  . Anemia   . Hypothyroidism   . Hypercholesterolemia   . Hypertension   . GERD (gastroesophageal reflux disease)     Current Outpatient Prescriptions on File Prior to Visit  Medication Sig Dispense Refill  . acetaminophen (TYLENOL) 500 MG tablet Take 500 mg by mouth every 6 (six) hours as needed.    . citalopram (CELEXA) 10 MG tablet 1/2 tablet q day 30 tablet 1  . Homeopathic Products ([employer] LEG CRAMPS PAIN RELIEF PO) Take by mouth as needed.    Marland Kitchen levothyroxine (SYNTHROID, LEVOTHROID) 75 MCG tablet TAKE ONE TABLET BY MOUTH ONCE DAILY 90 tablet 2  . lisinopril (PRINIVIL,ZESTRIL) 10 MG tablet TAKE ONE TABLET BY MOUTH ONCE DAILY 30 tablet 5  . traMADol (ULTRAM) 50 MG tablet Take 1 tablet (50 mg total) by mouth at bedtime as needed. 30 tablet 0   No current facility-administered medications on file prior to visit.    Review of Systems  Constitutional: Negative for appetite change and unexpected weight change.  HENT: Negative for congestion and sinus pressure.   Eyes: Negative for pain and visual disturbance.  Respiratory: Negative for cough, chest tightness and shortness of breath.   Cardiovascular: Negative for chest pain, palpitations and leg swelling.  Gastrointestinal: Negative for nausea, vomiting, abdominal pain and diarrhea.  Genitourinary: Negative for dysuria and frequency.  Musculoskeletal: Positive for  back pain (chronic.  ). Negative for joint swelling.  Skin: Negative for color change and rash.  Neurological: Negative for dizziness, light-headedness and headaches.  Hematological: Negative for adenopathy. Does not bruise/bleed easily.  Psychiatric/Behavioral: Negative for dysphoric mood and agitation.       Objective:     Blood pressure recheck:  138/78  Physical Exam  Constitutional: She is oriented to person, place, and time. She appears well-developed and well-nourished.  HENT:  Nose: Nose normal.  Mouth/Throat: Oropharynx is clear and moist.  Eyes: Right eye exhibits no discharge. Left eye exhibits no discharge. No scleral icterus.  Neck: Neck supple. No thyromegaly present.  Cardiovascular: Normal rate and regular rhythm.   Pulmonary/Chest: Breath sounds normal. No accessory muscle usage. No tachypnea. No respiratory distress. She has no decreased breath sounds. She has no wheezes. She has no rhonchi. Right breast exhibits no inverted nipple, no mass, no nipple discharge and no tenderness (no axillary adenopathy). Left breast exhibits no inverted nipple, no mass, no nipple discharge and no tenderness (no axilarry adenopathy).  Abdominal: Soft. Bowel sounds are normal. There is no tenderness.  Genitourinary:  Erythematous rash - beneath breast and peri vaginal area.  Appears to be c/w yeast infection.    Musculoskeletal: She exhibits no edema or tenderness.  Lymphadenopathy:    She has no cervical adenopathy.  Neurological: She is alert and oriented to person, place, and time.  Skin: Skin is warm. No rash noted.  Psychiatric: She has a normal mood and affect. Her behavior is normal.    BP 132/70 mmHg  Pulse 54  Temp(Src) 97.8 F (36.6 C) (Oral)  Ht 5\' 3"  (1.6 m)  Wt 145 lb 2 oz (65.828 kg)  BMI 25.71 kg/m2  SpO2 96% Wt Readings from Last 3 Encounters:  03/19/15 145 lb 2 oz (65.828 kg)  09/22/14 143 lb (64.864 kg)  05/11/14 145 lb 4 oz (65.885 kg)     Lab Results    Component Value Date   WBC 7.7 11/23/2014   HGB 12.8 11/23/2014   HCT 39.6 11/23/2014   PLT 312.0 11/23/2014   GLUCOSE 90 03/19/2015   CHOL 216* 03/19/2015   TRIG 96.0 03/19/2015   HDL 73.50 03/19/2015   LDLDIRECT 123.3 10/24/2012   LDLCALC 123* 03/19/2015   ALT 10 03/19/2015   AST 13 03/19/2015   NA 139 03/19/2015   K 4.3 03/19/2015   CL 105 03/19/2015   CREATININE 0.64 03/19/2015   BUN 14 03/19/2015   CO2 27 03/19/2015   TSH 1.54 03/19/2015       Assessment & Plan:   Problem List Items Addressed This Visit    Back pain    Chronic.  Tylenol as directed.  Follow.        Relevant Orders   Urinalysis, Routine w reflex microscopic (Completed)   CULTURE, URINE COMPREHENSIVE (Completed)   Bad odor of urine    Check urinalysis to confirm no infection.       GERD (gastroesophageal reflux disease)    On protonix.  Stable.       Relevant Medications   pantoprazole (PROTONIX) 40 MG tablet   Health care maintenance    Physical 03/19/15.  Declines bone density, mammogram and GI evaluation.        Hypercholesterolemia    Low cholesterol diet and exercise.  Follow lipid panel.       Relevant Orders   Lipid panel (Completed)   Hypertension - Primary    Blood pressure has been doing well.  Follow pressures.  Same medication regimen.        Relevant Orders   Basic metabolic panel (Completed)   Hypothyroidism    On thyroid replacement.  Follow. Tsh.       Relevant Orders   TSH (Completed)   Lymphoma    S/p treatment.  Seeing Dr Oliva Bustard.        Relevant Orders   Hepatic function panel (Completed)   Rash    Rash on leg - TCC .1%.  Follow.  Rash beneath breasts and in groin - nystatin cream.  Follow.        Renal mass    Found incidentally.  Evaluated by urology and Dr Oliva Bustard.  Continues to be followed by urology.         I spent 25 minutes with the patient and more than 50% of the time was spent in consultation regarding the above.     Tasha Pheasant,  MD

## 2015-03-22 ENCOUNTER — Other Ambulatory Visit: Payer: Self-pay | Admitting: *Deleted

## 2015-03-22 LAB — CULTURE, URINE COMPREHENSIVE

## 2015-03-22 MED ORDER — CIPROFLOXACIN HCL 250 MG PO TABS: 250.0000 mg | ORAL_TABLET | Freq: Two times a day (BID) | ORAL | Status: DC

## 2015-03-26 NOTE — Consult Note (Signed)
patient was getting Rituxan.  Started developing generalized itching and a rash.  I was called in infusion center for evaluationwas no shortness of breath no chest pain.  Her rash was mainly confined to lower extremity.vital signs were reviewed from the nurse's note which was stableAir entry was equal on both sides no crepitation. no  rhonchi. or  RalesNormal heart soundsthe patient was stableShowing hyoscine lower extremity.  Upper extremity back is within normal limitwas given Solu Cortef 200 mg12.5 mg was  once again examined but in rash and itching has resolved.was started lower rate.was again examined after 30 minuteswas no reaction and rate was increasedtolerated Total dose  of Rituxan without any significant complication   Electronic Signatures: Cammi Consalvo, Martie Lee (MD)  (Signed on 25-Apr-14 16:09)  Authored  Last Updated: 25-Apr-14 16:09 by Jobe Gibbon (MD)

## 2015-03-28 ENCOUNTER — Encounter: Payer: Self-pay | Admitting: Internal Medicine

## 2015-03-28 DIAGNOSIS — R21 Rash and other nonspecific skin eruption: Secondary | ICD-10-CM | POA: Insufficient documentation

## 2015-03-28 DIAGNOSIS — Z0000 Encounter for general adult medical examination without abnormal findings: Secondary | ICD-10-CM | POA: Insufficient documentation

## 2015-03-28 NOTE — Assessment & Plan Note (Signed)
Blood pressure has been doing well.  Follow pressures.  Same medication regimen.

## 2015-03-28 NOTE — Assessment & Plan Note (Signed)
Low cholesterol diet and exercise.  Follow lipid panel.   

## 2015-03-28 NOTE — Assessment & Plan Note (Signed)
Physical 03/19/15.  Declines bone density, mammogram and GI evaluation.

## 2015-03-28 NOTE — Assessment & Plan Note (Signed)
On protonix.  Stable.  

## 2015-03-28 NOTE — Assessment & Plan Note (Signed)
Found incidentally.  Evaluated by urology and Dr Oliva Bustard.  Continues to be followed by urology.

## 2015-03-28 NOTE — Assessment & Plan Note (Signed)
S/p treatment.  Seeing Dr Oliva Bustard.

## 2015-03-28 NOTE — Assessment & Plan Note (Signed)
Check urinalysis to confirm no infection.

## 2015-03-28 NOTE — Assessment & Plan Note (Signed)
Rash on leg - TCC .1%.  Follow.  Rash beneath breasts and in groin - nystatin cream.  Follow.

## 2015-03-28 NOTE — Assessment & Plan Note (Signed)
Chronic.  Tylenol as directed.  Follow.

## 2015-03-28 NOTE — Assessment & Plan Note (Signed)
On thyroid replacement.  Follow. Tsh.

## 2015-04-16 ENCOUNTER — Other Ambulatory Visit: Payer: Self-pay | Admitting: Internal Medicine

## 2015-04-16 NOTE — Telephone Encounter (Signed)
Patient was prescribed Cipro 250mg  tablet #10 for a 5 day course, received the interface for a refill?  Please advise??

## 2015-04-16 NOTE — Telephone Encounter (Signed)
If she feels symptoms are improved just not resolve, I am ok to extend.  Can do cipro 250mg  bid x 5 days.  If persistent problems or continue feeling bad - let us know.  Will need to be reevaluated.

## 2015-04-16 NOTE — Telephone Encounter (Signed)
Spoke with the patient, she has been having continued symptoms and believes that she may need more antibiotics.  Patient also was concerned with "just not feeling well" across the board.  Please advise?

## 2015-04-16 NOTE — Telephone Encounter (Signed)
This was for acute uti.  Usually do not just refill.  If having persistent problems, but better (just not think completely clear) - then let me know.  I can extend if needed.

## 2015-05-10 ENCOUNTER — Ambulatory Visit: Payer: Medicare Other | Admitting: Oncology

## 2015-05-10 ENCOUNTER — Other Ambulatory Visit: Payer: Medicare Other

## 2015-05-28 ENCOUNTER — Other Ambulatory Visit: Payer: Self-pay | Admitting: *Deleted

## 2015-05-28 DIAGNOSIS — C8590 Non-Hodgkin lymphoma, unspecified, unspecified site: Secondary | ICD-10-CM

## 2015-06-01 ENCOUNTER — Inpatient Hospital Stay: Payer: Medicare Other

## 2015-06-01 ENCOUNTER — Inpatient Hospital Stay: Payer: Medicare Other | Attending: Oncology | Admitting: Oncology

## 2015-06-01 VITALS — BP 135/63 | HR 67 | Temp 96.9°F | Wt 145.7 lb

## 2015-06-01 DIAGNOSIS — K219 Gastro-esophageal reflux disease without esophagitis: Secondary | ICD-10-CM

## 2015-06-01 DIAGNOSIS — Z8572 Personal history of non-Hodgkin lymphomas: Secondary | ICD-10-CM

## 2015-06-01 DIAGNOSIS — Z9221 Personal history of antineoplastic chemotherapy: Secondary | ICD-10-CM | POA: Diagnosis not present

## 2015-06-01 DIAGNOSIS — I10 Essential (primary) hypertension: Secondary | ICD-10-CM

## 2015-06-01 DIAGNOSIS — D649 Anemia, unspecified: Secondary | ICD-10-CM | POA: Diagnosis not present

## 2015-06-01 DIAGNOSIS — E780 Pure hypercholesterolemia: Secondary | ICD-10-CM

## 2015-06-01 DIAGNOSIS — Z90710 Acquired absence of both cervix and uterus: Secondary | ICD-10-CM | POA: Diagnosis not present

## 2015-06-01 DIAGNOSIS — R351 Nocturia: Secondary | ICD-10-CM

## 2015-06-01 DIAGNOSIS — Z9049 Acquired absence of other specified parts of digestive tract: Secondary | ICD-10-CM | POA: Diagnosis not present

## 2015-06-01 DIAGNOSIS — Z79899 Other long term (current) drug therapy: Secondary | ICD-10-CM

## 2015-06-01 DIAGNOSIS — R000 Tachycardia, unspecified: Secondary | ICD-10-CM

## 2015-06-01 DIAGNOSIS — E039 Hypothyroidism, unspecified: Secondary | ICD-10-CM | POA: Diagnosis not present

## 2015-06-01 DIAGNOSIS — M129 Arthropathy, unspecified: Secondary | ICD-10-CM | POA: Diagnosis not present

## 2015-06-01 DIAGNOSIS — Z87440 Personal history of urinary (tract) infections: Secondary | ICD-10-CM

## 2015-06-01 DIAGNOSIS — C8590 Non-Hodgkin lymphoma, unspecified, unspecified site: Secondary | ICD-10-CM

## 2015-06-01 LAB — CBC WITH DIFFERENTIAL/PLATELET
BASOS PCT: 1 %
Basophils Absolute: 0.1 10*3/uL (ref 0–0.1)
Eosinophils Absolute: 0.2 10*3/uL (ref 0–0.7)
Eosinophils Relative: 2 %
HCT: 37.6 % (ref 35.0–47.0)
Hemoglobin: 12.3 g/dL (ref 12.0–16.0)
Lymphocytes Relative: 22 %
Lymphs Abs: 1.6 10*3/uL (ref 1.0–3.6)
MCH: 29.8 pg (ref 26.0–34.0)
MCHC: 32.7 g/dL (ref 32.0–36.0)
MCV: 91.2 fL (ref 80.0–100.0)
MONO ABS: 0.7 10*3/uL (ref 0.2–0.9)
MONOS PCT: 10 %
NEUTROS PCT: 65 %
Neutro Abs: 4.8 10*3/uL (ref 1.4–6.5)
Platelets: 294 10*3/uL (ref 150–440)
RBC: 4.12 MIL/uL (ref 3.80–5.20)
RDW: 13.9 % (ref 11.5–14.5)
WBC: 7.4 10*3/uL (ref 3.6–11.0)

## 2015-06-01 LAB — URINALYSIS COMPLETE WITH MICROSCOPIC (ARMC ONLY)
Bilirubin Urine: NEGATIVE
Glucose, UA: NEGATIVE mg/dL
HGB URINE DIPSTICK: NEGATIVE
Ketones, ur: NEGATIVE mg/dL
Nitrite: NEGATIVE
Protein, ur: NEGATIVE mg/dL
Specific Gravity, Urine: 1.004 — ABNORMAL LOW (ref 1.005–1.030)
pH: 6 (ref 5.0–8.0)

## 2015-06-01 LAB — COMPREHENSIVE METABOLIC PANEL
ALT: 13 U/L — ABNORMAL LOW (ref 14–54)
ANION GAP: 7 (ref 5–15)
AST: 17 U/L (ref 15–41)
Albumin: 4 g/dL (ref 3.5–5.0)
Alkaline Phosphatase: 58 U/L (ref 38–126)
BUN: 16 mg/dL (ref 6–20)
CALCIUM: 8 mg/dL — AB (ref 8.9–10.3)
CO2: 24 mmol/L (ref 22–32)
CREATININE: 0.9 mg/dL (ref 0.44–1.00)
Chloride: 105 mmol/L (ref 101–111)
GFR, EST NON AFRICAN AMERICAN: 57 mL/min — AB (ref >60)
Glucose, Bld: 113 mg/dL — ABNORMAL HIGH (ref 65–99)
Potassium: 4.3 mmol/L (ref 3.5–5.1)
Sodium: 136 mmol/L (ref 135–145)
Total Bilirubin: 0.6 mg/dL (ref 0.3–1.2)
Total Protein: 7.3 g/dL (ref 6.5–8.1)

## 2015-06-01 LAB — LACTATE DEHYDROGENASE: LDH: 166 U/L (ref 98–192)

## 2015-06-01 MED ORDER — TRAMADOL HCL 50 MG PO TABS: 50.0000 mg | ORAL_TABLET | Freq: Every evening | ORAL | Status: DC | PRN

## 2015-06-01 NOTE — Progress Notes (Signed)
Patient does not have living will.  Never smoked. 

## 2015-06-03 LAB — URINE CULTURE

## 2015-06-04 ENCOUNTER — Encounter: Payer: Self-pay | Admitting: Oncology

## 2015-06-04 NOTE — Progress Notes (Signed)
[place] @ [facility] [phone]  Fax:([phone]) [place] OB: Mar 15, 1928  MR#: [id number]  ZLD#:[id number]  Patient Care Team: Einar Pheasant, MD as PCP - General (Internal Medicine) Einar Pheasant, MD (Internal Medicine)  CHIEF COMPLAINT:  Chief Complaint  Patient presents with  . Follow-up    Oncology History   Follicular lymphoma stage IIIB.  Status post Rituxan therapy . 's Diagnosis in August of 2014     Lymphoma    INTERVAL HISTORY:  81 year old lady came today further follow-up regarding stage IIIB follicular lymphoma.  Patient has been treated with Rituxan as a single agent treatment significant improvement in symptoms and PET scan.  Patient continues to have arthritis pain limiting ambulation Difficulty in sleeping because of increasing pain in the joints   REVIEW OF SYSTEMS:   Gen. Status: Patient is in wheelchair because of arthritis pain HEENT: No headache dizziness Lungs: Shortness of breath on exertion Cardiac: No chest pain History of coronary artery disease Patient has hypothyroidism GI: No nausea no vomiting no diarrhea GU: No dysuria hematuria Skin: No rash  Musculoskeletal system diffuse joint pain As per HPI. Otherwise, a complete review of systems is negatve.  PAST MEDICAL HISTORY: Past Medical History  Diagnosis Date  . Anemia   . Hypothyroidism   . Hypercholesterolemia   . Hypertension   . GERD (gastroesophageal reflux disease)     PAST SURGICAL HISTORY: Past Surgical History  Procedure Laterality Date  . Abdominal hysterectomy  1960s    bladder tack at the same time, ovaries not removed  . Laparoscopic cholecystectomy  5/11    FAMILY HISTORY Family History  Problem Relation Age of Onset  . Lymphoma Brother   . Diabetes Brother     x2  . Thyroid disease Brother     ADVANCED DIRECTIVES:  Patient does not have a living will.  Information was given HEALTH MAINTENANCE: History  Substance  Use Topics  . Smoking status: Never Smoker   . Smokeless tobacco: Never Used  . Alcohol Use: No      Allergies  Allergen Reactions  . No Known Drug Allergy     Current Outpatient Prescriptions  Medication Sig Dispense Refill  . acetaminophen (TYLENOL) 500 MG tablet Take 500 mg by mouth every 6 (six) hours as needed.    . Homeopathic Products ([employer] LEG CRAMPS PAIN RELIEF PO) Take by mouth as needed.    Marland Kitchen levothyroxine (SYNTHROID, LEVOTHROID) 75 MCG tablet TAKE ONE TABLET BY MOUTH ONCE DAILY 90 tablet 2  . lisinopril (PRINIVIL,ZESTRIL) 10 MG tablet TAKE ONE TABLET BY MOUTH ONCE DAILY 30 tablet 5  . pantoprazole (PROTONIX) 40 MG tablet Take 1 tablet (40 mg total) by mouth daily. 30 tablet 5  . ciprofloxacin (CIPRO) 250 MG tablet TAKE ONE TABLET BY MOUTH TWICE DAILY (Patient not taking: Reported on 06/01/2015) 10 tablet 0  . citalopram (CELEXA) 10 MG tablet 1/2 tablet q day (Patient not taking: Reported on 06/01/2015) 30 tablet 1  . nystatin cream (MYCOSTATIN) Apply 1 application topically 2 (two) times daily. (Patient not taking: Reported on 06/01/2015) 30 g 1  . traMADol (ULTRAM) 50 MG tablet Take 1 tablet (50 mg total) by mouth at bedtime as needed. 30 tablet 3  . triamcinolone cream (KENALOG) 0.1 % Apply 1 application topically 2 (two) times daily. (Patient not taking: Reported on 06/01/2015) 30 g 0   No current facility-administered medications for this visit.    OBJECTIVE:  Filed Vitals:  06/01/15 1217  BP: 135/63  Pulse: 67  Temp: 96.9 F (36.1 C)     Body mass index is 25.82 kg/(m^2).    ECOG FS:1 - Symptomatic but completely ambulatory  PHYSICAL EXAM: Gen. Status: Patient is in wheelchair because of arthritis pain Lymphatic system: Supraclavicular, cervical, axillary, inguinal lymph nodes are not palpable Cardiac: Tachycardia soft systolic murmur Examination of the chest was unremarkable. There were no bony deformities, no asymmetry, and no other abnormalities. Abdominal  exam revealed normal bowel sounds. The abdomen was soft, non-tender, and without masses, organomegaly, or appreciable enlargement of the abdominal aorta. Examination of the skin revealed no evidence of significant rashes, suspicious appearing nevi or other concerning lesions. Musculoskeletal system joint pains and joint deformity most likely due to osteoarthritis Neurologically, the patient was awake, alert, and oriented to person, place and time. There were no obvious focal neurologic abnormalities. Examination of the skin revealed no evidence of significant rashes, suspicious appearing nevi or other concerning lesions.   LAB RESULTS:  Appointment on 06/01/2015  Component Date Value Ref Range Status  . WBC 06/01/2015 7.4  3.6 - 11.0 K/uL Final  . RBC 06/01/2015 4.12  3.80 - 5.20 MIL/uL Final  . Hemoglobin 06/01/2015 12.3  12.0 - 16.0 g/dL Final  . HCT 06/01/2015 37.6  35.0 - 47.0 % Final  . MCV 06/01/2015 91.2  80.0 - 100.0 fL Final  . MCH 06/01/2015 29.8  26.0 - 34.0 pg Final  . MCHC 06/01/2015 32.7  32.0 - 36.0 g/dL Final  . RDW 06/01/2015 13.9  11.5 - 14.5 % Final  . Platelets 06/01/2015 294  150 - 440 K/uL Final  . Neutrophils Relative % 06/01/2015 65   Final  . Neutro Abs 06/01/2015 4.8  1.4 - 6.5 K/uL Final  . Lymphocytes Relative 06/01/2015 22   Final  . Lymphs Abs 06/01/2015 1.6  1.0 - 3.6 K/uL Final  . Monocytes Relative 06/01/2015 10   Final  . Monocytes Absolute 06/01/2015 0.7  0.2 - 0.9 K/uL Final  . Eosinophils Relative 06/01/2015 2   Final  . Eosinophils Absolute 06/01/2015 0.2  0 - 0.7 K/uL Final  . Basophils Relative 06/01/2015 1   Final  . Basophils Absolute 06/01/2015 0.1  0 - 0.1 K/uL Final  . Sodium 06/01/2015 136  135 - 145 mmol/L Final  . Potassium 06/01/2015 4.3  3.5 - 5.1 mmol/L Final  . Chloride 06/01/2015 105  101 - 111 mmol/L Final  . CO2 06/01/2015 24  22 - 32 mmol/L Final  . Glucose, Bld 06/01/2015 113* 65 - 99 mg/dL Final  . BUN 06/01/2015 16  6 - 20  mg/dL Final  . Creatinine, Ser 06/01/2015 0.90  0.44 - 1.00 mg/dL Final  . Calcium 06/01/2015 8.0* 8.9 - 10.3 mg/dL Final  . Total Protein 06/01/2015 7.3  6.5 - 8.1 g/dL Final  . Albumin 06/01/2015 4.0  3.5 - 5.0 g/dL Final  . AST 06/01/2015 17  15 - 41 U/L Final  . ALT 06/01/2015 13* 14 - 54 U/L Final  . Alkaline Phosphatase 06/01/2015 58  38 - 126 U/L Final  . Total Bilirubin 06/01/2015 0.6  0.3 - 1.2 mg/dL Final  . GFR calc non Af Amer 06/01/2015 57* >60 mL/min Final  . GFR calc Af Amer 06/01/2015 >60  >60 mL/min Final   Comment: (NOTE) The eGFR has been calculated using the CKD EPI equation. This calculation has not been validated in all clinical situations. eGFR's persistently <60 mL/min signify possible  Chronic Kidney Disease.   . Anion gap 06/01/2015 7  5 - 15 Final  . LDH 06/01/2015 166  98 - 192 U/L Final  . Color, Urine 06/01/2015 STRAW* YELLOW Final  . APPearance 06/01/2015 CLEAR* CLEAR Final  . Glucose, UA 06/01/2015 NEGATIVE  NEGATIVE mg/dL Final  . Bilirubin Urine 06/01/2015 NEGATIVE  NEGATIVE Final  . Ketones, ur 06/01/2015 NEGATIVE  NEGATIVE mg/dL Final  . Specific Gravity, Urine 06/01/2015 1.004* 1.005 - 1.030 Final  . Hgb urine dipstick 06/01/2015 NEGATIVE  NEGATIVE Final  . pH 06/01/2015 6.0  5.0 - 8.0 Final  . Protein, ur 06/01/2015 NEGATIVE  NEGATIVE mg/dL Final  . Nitrite 06/01/2015 NEGATIVE  NEGATIVE Final  . Leukocytes, UA 06/01/2015 TRACE* NEGATIVE Final  . RBC / HPF 06/01/2015 0-5  0 - 5 RBC/hpf Final  . WBC, UA 06/01/2015 0-5  0 - 5 WBC/hpf Final  . Bacteria, UA 06/01/2015 RARE* NONE SEEN Final  . Squamous Epithelial / LPF 06/01/2015 0-5* NONE SEEN Final  . Specimen Description 06/01/2015 URINE, CLEAN CATCH   Final  . Special Requests 06/01/2015 URINE, RANDOM   Final  . Culture 06/01/2015 MULTIPLE SPECIES PRESENT, SUGGEST RECOLLECTION IF CLINICALLY INDICATED   Final  . Report Status 06/01/2015 06/03/2015 FINAL   Final     ASSESSMENT: Follicular  lymphoma stage IIIB Status post Rituxan therapy Generalized arthritis Sleep disturbances  MEDICAL DECISION MAKING:  All lab data has been reviewed.  There is no evidence of recurrent lymphoma. For arthritis pain which is not controlled with Tylenol patient was given tramadol (taken at bedtime Patient has recurrent urinary tract infection.  A repeat urine analysis was negative and culture was showing multiple organisms suggesting contamination  Patient expressed understanding and was in agreement with this plan. She also understands that She can call clinic at any time with any questions, concerns, or complaints.    No matching staging information was found for the patient.  Forest Gleason, MD   06/04/2015 7:48 PM

## 2015-06-15 DIAGNOSIS — H2513 Age-related nuclear cataract, bilateral: Secondary | ICD-10-CM | POA: Diagnosis not present

## 2015-07-20 ENCOUNTER — Encounter: Payer: Self-pay | Admitting: Internal Medicine

## 2015-07-20 ENCOUNTER — Ambulatory Visit (INDEPENDENT_AMBULATORY_CARE_PROVIDER_SITE_OTHER): Payer: Medicare Other | Admitting: Internal Medicine

## 2015-07-20 VITALS — BP 110/68 | HR 65 | Temp 97.8°F | Ht 63.0 in | Wt 140.5 lb

## 2015-07-20 DIAGNOSIS — E7800 Pure hypercholesterolemia, unspecified: Secondary | ICD-10-CM

## 2015-07-20 DIAGNOSIS — E039 Hypothyroidism, unspecified: Secondary | ICD-10-CM | POA: Diagnosis not present

## 2015-07-20 DIAGNOSIS — N2889 Other specified disorders of kidney and ureter: Secondary | ICD-10-CM

## 2015-07-20 DIAGNOSIS — M545 Low back pain: Secondary | ICD-10-CM

## 2015-07-20 DIAGNOSIS — C8590 Non-Hodgkin lymphoma, unspecified, unspecified site: Secondary | ICD-10-CM

## 2015-07-20 DIAGNOSIS — I10 Essential (primary) hypertension: Secondary | ICD-10-CM | POA: Diagnosis not present

## 2015-07-20 DIAGNOSIS — R112 Nausea with vomiting, unspecified: Secondary | ICD-10-CM

## 2015-07-20 DIAGNOSIS — K219 Gastro-esophageal reflux disease without esophagitis: Secondary | ICD-10-CM | POA: Diagnosis not present

## 2015-07-20 LAB — CBC WITH DIFFERENTIAL/PLATELET
BASOS PCT: 0.8 % (ref 0.0–3.0)
Basophils Absolute: 0 10*3/uL (ref 0.0–0.1)
EOS PCT: 1.1 % (ref 0.0–5.0)
Eosinophils Absolute: 0.1 10*3/uL (ref 0.0–0.7)
HCT: 38.5 % (ref 36.0–46.0)
HEMOGLOBIN: 12.7 g/dL (ref 12.0–15.0)
Lymphocytes Relative: 27.9 % (ref 12.0–46.0)
Lymphs Abs: 1.8 10*3/uL (ref 0.7–4.0)
MCHC: 33 g/dL (ref 30.0–36.0)
MCV: 91.8 fl (ref 78.0–100.0)
MONOS PCT: 12.6 % — AB (ref 3.0–12.0)
Monocytes Absolute: 0.8 10*3/uL (ref 0.1–1.0)
Neutro Abs: 3.6 10*3/uL (ref 1.4–7.7)
Neutrophils Relative %: 57.6 % (ref 43.0–77.0)
Platelets: 291 10*3/uL (ref 150.0–400.0)
RBC: 4.19 Mil/uL (ref 3.87–5.11)
RDW: 14.4 % (ref 11.5–15.5)
WBC: 6.3 10*3/uL (ref 4.0–10.5)

## 2015-07-20 LAB — BASIC METABOLIC PANEL
BUN: 25 mg/dL — ABNORMAL HIGH (ref 6–23)
CHLORIDE: 105 meq/L (ref 96–112)
CO2: 27 mEq/L (ref 19–32)
Calcium: 8.5 mg/dL (ref 8.4–10.5)
Creatinine, Ser: 0.81 mg/dL (ref 0.40–1.20)
GFR: 71.46 mL/min (ref >60.00)
GLUCOSE: 102 mg/dL — AB (ref 70–99)
POTASSIUM: 3.5 meq/L (ref 3.5–5.1)
SODIUM: 139 meq/L (ref 135–145)

## 2015-07-20 LAB — HEPATIC FUNCTION PANEL
ALT: 15 U/L (ref 0–35)
AST: 20 U/L (ref 0–37)
Albumin: 3.7 g/dL (ref 3.5–5.2)
Alkaline Phosphatase: 40 U/L (ref 39–117)
BILIRUBIN DIRECT: 0.1 mg/dL (ref 0.0–0.3)
BILIRUBIN TOTAL: 0.4 mg/dL (ref 0.2–1.2)
TOTAL PROTEIN: 6.5 g/dL (ref 6.0–8.3)

## 2015-07-20 MED ORDER — ONDANSETRON HCL 4 MG PO TABS: 4.0000 mg | ORAL_TABLET | Freq: Two times a day (BID) | ORAL | Status: DC | PRN

## 2015-07-20 NOTE — Progress Notes (Signed)
Patient ID: Tasha Avila, female   DOB: Apr 27, 1928, 81 y.o.   MRN: [id number]   Subjective:    Patient ID: Tasha Avila, female    DOB: 21-Nov-1928, 81 y.o.   MRN: [id number]  HPI  Patient here for a scheduled follow up.  She has had some vomiting and diarrhea for the last few days.  Is better now.  No vomiting in the last 24 hours or more.  Diarrhea is better.  Was able to eat today.  Still with some nausea.  She is trying to stay hydrated.  No acid reflux.  Breathing stable.  No sob reported.  No increased cough or congestion.    Past Medical History  Diagnosis Date  . Anemia   . Hypothyroidism   . Hypercholesterolemia   . Hypertension   . GERD (gastroesophageal reflux disease)     Family history and social history reviewed and unchanged.     Outpatient Encounter Prescriptions as of 07/20/2015  Medication Sig  . acetaminophen (TYLENOL) 500 MG tablet Take 500 mg by mouth every 6 (six) hours as needed.  . Homeopathic Products ([employer] LEG CRAMPS PAIN RELIEF PO) Take by mouth as needed.  Marland Kitchen levothyroxine (SYNTHROID, LEVOTHROID) 75 MCG tablet TAKE ONE TABLET BY MOUTH ONCE DAILY  . lisinopril (PRINIVIL,ZESTRIL) 10 MG tablet TAKE ONE TABLET BY MOUTH ONCE DAILY  . pantoprazole (PROTONIX) 40 MG tablet Take 1 tablet (40 mg total) by mouth daily.  . traMADol (ULTRAM) 50 MG tablet Take 1 tablet (50 mg total) by mouth at bedtime as needed.  . citalopram (CELEXA) 10 MG tablet 1/2 tablet q day (Patient not taking: Reported on 06/01/2015)  . ondansetron (ZOFRAN) 4 MG tablet Take 1 tablet (4 mg total) by mouth 2 (two) times daily as needed for nausea or vomiting.  . [DISCONTINUED] ciprofloxacin (CIPRO) 250 MG tablet TAKE ONE TABLET BY MOUTH TWICE DAILY (Patient not taking: Reported on 06/01/2015)  . [DISCONTINUED] nystatin cream (MYCOSTATIN) Apply 1 application topically 2 (two) times daily. (Patient not taking: Reported on 06/01/2015)  . [DISCONTINUED] triamcinolone cream (KENALOG) 0.1 %  Apply 1 application topically 2 (two) times daily. (Patient not taking: Reported on 06/01/2015)   No facility-administered encounter medications on file as of 07/20/2015.    Review of Systems  Constitutional: Positive for appetite change (with recent vomiting and diarrhea.  ). Negative for fever.  HENT: Negative for congestion and sinus pressure.   Eyes: Negative for pain and discharge.  Respiratory: Negative for cough, chest tightness and shortness of breath.   Cardiovascular: Negative for chest pain, palpitations and leg swelling.  Gastrointestinal: Positive for nausea (still with some nausea.  ). Negative for vomiting (none now.  ) and abdominal distention (does report soreness from the vomiting. ).  Genitourinary: Negative for dysuria and difficulty urinating.  Skin: Negative for color change and rash.  Neurological: Negative for dizziness, light-headedness and headaches.  Psychiatric/Behavioral: Negative for dysphoric mood and agitation.       Objective:    Physical Exam  Constitutional: She appears well-developed and well-nourished. No distress.  HENT:  Nose: Nose normal.  Mouth/Throat: Oropharynx is clear and moist.  Neck: Neck supple. No thyromegaly present.  Cardiovascular: Normal rate and regular rhythm.   Pulmonary/Chest: Breath sounds normal. No respiratory distress. She has no wheezes.  Abdominal: Soft. Bowel sounds are normal. There is no tenderness.  Musculoskeletal: She exhibits no edema or tenderness.  Lymphadenopathy:    She has no cervical adenopathy.  Skin: No rash noted.  No erythema.  Psychiatric: She has a normal mood and affect. Her behavior is normal.    BP 110/68 mmHg  Pulse 65  Temp(Src) 97.8 F (36.6 C) (Oral)  Ht 5\' 3"  (1.6 m)  Wt 140 lb 8 oz (63.73 kg)  BMI 24.89 kg/m2  SpO2 94% Wt Readings from Last 3 Encounters:  07/20/15 140 lb 8 oz (63.73 kg)  06/01/15 145 lb 11.6 oz (66.1 kg)  03/19/15 145 lb 2 oz (65.828 kg)     Lab Results    Component Value Date   WBC 6.3 07/20/2015   HGB 12.7 07/20/2015   HCT 38.5 07/20/2015   PLT 291.0 07/20/2015   GLUCOSE 102* 07/20/2015   CHOL 216* 03/19/2015   TRIG 96.0 03/19/2015   HDL 73.50 03/19/2015   LDLDIRECT 123.3 10/24/2012   LDLCALC 123* 03/19/2015   ALT 15 07/20/2015   AST 20 07/20/2015   NA 139 07/20/2015   K 3.5 07/20/2015   CL 105 07/20/2015   CREATININE 0.81 07/20/2015   BUN 25* 07/20/2015   CO2 27 07/20/2015   TSH 1.54 03/19/2015       Assessment & Plan:   Problem List Items Addressed This Visit    Back pain    Chronic.  Tylenol.        GERD (gastroesophageal reflux disease)    On protonix.        Relevant Medications   ondansetron (ZOFRAN) 4 MG tablet   Hypercholesterolemia    Low cholesterol diet and exercise.  Follow lipid panel.        Hypertension    Blood pressure under good control.  Continue same medication regimen.  Follow pressures.  Follow metabolic panel.        Hypothyroidism    On thyroid replacement.  Follow tsh.        Lymphoma (Chronic)    Followed by Dr Oliva Bustard.  Stable.        Nausea with vomiting - Primary    Vomiting resolved.  Diarrhea better.  No abdominal pain.  Still with some nausea.  zofran as directed.  Advance diet slowly.        Relevant Orders   Hepatic function panel (Completed)   Basic metabolic panel (Completed)   CBC with Differential/Platelet (Completed)   Renal mass    Found incidentally.  Evaluated by urology and Dr Oliva Bustard.  Followed by urology.           Einar Pheasant, MD

## 2015-07-20 NOTE — Progress Notes (Signed)
Pre visit review using our clinic review tool, if applicable. No additional management support is needed unless otherwise documented below in the visit note. 

## 2015-07-21 ENCOUNTER — Encounter: Payer: Self-pay | Admitting: Internal Medicine

## 2015-07-21 ENCOUNTER — Encounter: Payer: Self-pay | Admitting: *Deleted

## 2015-07-21 DIAGNOSIS — R112 Nausea with vomiting, unspecified: Secondary | ICD-10-CM | POA: Insufficient documentation

## 2015-07-21 NOTE — Assessment & Plan Note (Signed)
Blood pressure under good control.  Continue same medication regimen.  Follow pressures.  Follow metabolic panel.   

## 2015-07-21 NOTE — Assessment & Plan Note (Signed)
On thyroid replacement.  Follow tsh.  

## 2015-07-21 NOTE — Assessment & Plan Note (Signed)
Low cholesterol diet and exercise.  Follow lipid panel.   

## 2015-07-21 NOTE — Assessment & Plan Note (Signed)
Chronic.  Tylenol.

## 2015-07-21 NOTE — Assessment & Plan Note (Signed)
Vomiting resolved.  Diarrhea better.  No abdominal pain.  Still with some nausea.  zofran as directed.  Advance diet slowly.

## 2015-07-21 NOTE — Assessment & Plan Note (Signed)
Found incidentally.  Evaluated by urology and Dr Oliva Bustard.  Followed by urology.

## 2015-07-21 NOTE — Assessment & Plan Note (Signed)
On protonix

## 2015-07-21 NOTE — Assessment & Plan Note (Signed)
Followed by Dr Oliva Bustard.  Stable.

## 2015-08-02 ENCOUNTER — Other Ambulatory Visit: Payer: Self-pay | Admitting: Internal Medicine

## 2015-10-07 ENCOUNTER — Other Ambulatory Visit: Payer: Self-pay | Admitting: Internal Medicine

## 2015-10-14 ENCOUNTER — Other Ambulatory Visit: Payer: Self-pay

## 2015-10-14 MED ORDER — PANTOPRAZOLE SODIUM 40 MG PO TBEC: 40.0000 mg | DELAYED_RELEASE_TABLET | Freq: Every day | ORAL | Status: DC

## 2015-10-20 ENCOUNTER — Encounter: Payer: Self-pay | Admitting: Internal Medicine

## 2015-10-20 ENCOUNTER — Ambulatory Visit (INDEPENDENT_AMBULATORY_CARE_PROVIDER_SITE_OTHER): Payer: Medicare Other | Admitting: Internal Medicine

## 2015-10-20 VITALS — BP 110/60 | HR 68 | Temp 98.0°F | Resp 18 | Ht 63.0 in | Wt 147.0 lb

## 2015-10-20 DIAGNOSIS — H6123 Impacted cerumen, bilateral: Secondary | ICD-10-CM

## 2015-10-20 DIAGNOSIS — C8590 Non-Hodgkin lymphoma, unspecified, unspecified site: Secondary | ICD-10-CM | POA: Diagnosis not present

## 2015-10-20 DIAGNOSIS — R350 Frequency of micturition: Secondary | ICD-10-CM | POA: Diagnosis not present

## 2015-10-20 DIAGNOSIS — R1310 Dysphagia, unspecified: Secondary | ICD-10-CM | POA: Diagnosis not present

## 2015-10-20 DIAGNOSIS — N2889 Other specified disorders of kidney and ureter: Secondary | ICD-10-CM

## 2015-10-20 DIAGNOSIS — K219 Gastro-esophageal reflux disease without esophagitis: Secondary | ICD-10-CM

## 2015-10-20 DIAGNOSIS — I10 Essential (primary) hypertension: Secondary | ICD-10-CM

## 2015-10-20 DIAGNOSIS — E039 Hypothyroidism, unspecified: Secondary | ICD-10-CM

## 2015-10-20 DIAGNOSIS — E7800 Pure hypercholesterolemia, unspecified: Secondary | ICD-10-CM

## 2015-10-20 DIAGNOSIS — M545 Low back pain: Secondary | ICD-10-CM

## 2015-10-20 LAB — URINALYSIS, ROUTINE W REFLEX MICROSCOPIC
Bilirubin Urine: NEGATIVE
Hgb urine dipstick: NEGATIVE
KETONES UR: NEGATIVE
Nitrite: POSITIVE — AB
PH: 5.5 (ref 5.0–8.0)
SPECIFIC GRAVITY, URINE: 1.025 (ref 1.000–1.030)
Total Protein, Urine: NEGATIVE
URINE GLUCOSE: NEGATIVE
UROBILINOGEN UA: 0.2 (ref 0.0–1.0)

## 2015-10-20 NOTE — Progress Notes (Signed)
Patient ID: Tasha Avila, female   DOB: 1928-08-13, 81 y.o.   MRN: [id number]   Subjective:    Patient ID: Tasha Avila, female    DOB: Feb 05, 1928, 81 y.o.   MRN: [id number]  HPI  Patient with past history of GERD, hypercholesterolemia, anemia, hypothyroidism, lymphoma and hypertension.  She comes in today to follow up on these issues.  She reports she is having issues with swallowing.  Food gets stuck.  She also still notices acid reflux despite taking PPI.  Discussed GI referral.  Discussed adding zantac to her regimen.  No chest pain or tightness.  Breathing stable.  No abdominal pain or cramping.  Some increased urinary frequency.  Concerned over possible uti.  Some issues with decreased sleep.  Has also noticed her ears feel stopped up again.  No pain.  Has problems with wax build up.     Past Medical History  Diagnosis Date  . Anemia   . Hypothyroidism   . Hypercholesterolemia   . Hypertension   . GERD (gastroesophageal reflux disease)    Past Surgical History  Procedure Laterality Date  . Abdominal hysterectomy  1960s    bladder tack at the same time, ovaries not removed  . Laparoscopic cholecystectomy  5/11   Family History  Problem Relation Age of Onset  . Lymphoma Brother   . Diabetes Brother     x2  . Thyroid disease Brother    Social History   Social History  . Marital Status: Married    Spouse Name: N/A  . Number of Children: 2  . Years of Education: N/A   Social History Main Topics  . Smoking status: Never Smoker   . Smokeless tobacco: Never Used  . Alcohol Use: No  . Drug Use: No  . Sexual Activity: Not Asked   Other Topics Concern  . None   Social History Narrative    Outpatient Encounter Prescriptions as of 10/20/2015  Medication Sig  . acetaminophen (TYLENOL) 500 MG tablet Take 500 mg by mouth every 6 (six) hours as needed.  . Homeopathic Products ([employer] LEG CRAMPS PAIN RELIEF PO) Take by mouth as needed.  Marland Kitchen levothyroxine  (SYNTHROID, LEVOTHROID) 75 MCG tablet TAKE ONE TABLET BY MOUTH ONCE DAILY  . lisinopril (PRINIVIL,ZESTRIL) 10 MG tablet TAKE ONE TABLET BY MOUTH ONCE DAILY  . pantoprazole (PROTONIX) 40 MG tablet Take 1 tablet (40 mg total) by mouth daily.  . traMADol (ULTRAM) 50 MG tablet Take 1 tablet (50 mg total) by mouth at bedtime as needed.  . [DISCONTINUED] citalopram (CELEXA) 10 MG tablet 1/2 tablet q day  . [DISCONTINUED] ondansetron (ZOFRAN) 4 MG tablet Take 1 tablet (4 mg total) by mouth 2 (two) times daily as needed for nausea or vomiting.   No facility-administered encounter medications on file as of 10/20/2015.    Review of Systems  Constitutional: Negative for appetite change and unexpected weight change.  HENT: Negative for congestion and sinus pressure.   Eyes: Negative for pain and discharge.  Respiratory: Negative for cough, chest tightness and shortness of breath.   Cardiovascular: Negative for chest pain, palpitations and leg swelling.  Gastrointestinal: Negative for nausea, vomiting, abdominal pain and diarrhea.       Dysphagia as outlined.    Genitourinary: Positive for frequency. Negative for difficulty urinating.  Musculoskeletal:       Back and joint pains as outlined.  Chronic.  Discussed taking scheduled tylenol.    Skin: Negative for color change and rash.  Neurological: Negative for dizziness, light-headedness and headaches.  Psychiatric/Behavioral: Negative for dysphoric mood and agitation.       Objective:    Physical Exam  Constitutional: She appears well-developed and well-nourished. No distress.  HENT:  Nose: Nose normal.  Mouth/Throat: Oropharynx is clear and moist.  Bilateral cerumen impaction.    Eyes: Conjunctivae are normal. Right eye exhibits no discharge. Left eye exhibits no discharge.  Neck: Neck supple. No thyromegaly present.  Cardiovascular: Normal rate and regular rhythm.   Pulmonary/Chest: Effort normal and breath sounds normal. No respiratory  distress. She has no wheezes.  Abdominal: Soft. Bowel sounds are normal. There is no tenderness.  Musculoskeletal: She exhibits no edema or tenderness.  Lymphadenopathy:    She has no cervical adenopathy.  Skin: No rash noted. No erythema.  Psychiatric: She has a normal mood and affect. Her behavior is normal.    BP 110/60 mmHg  Pulse 68  Temp(Src) 98 F (36.7 C) (Oral)  Resp 18  Ht 5\' 3"  (1.6 m)  Wt 147 lb (66.679 kg)  BMI 26.05 kg/m2  SpO2 96% Wt Readings from Last 3 Encounters:  10/20/15 147 lb (66.679 kg)  07/20/15 140 lb 8 oz (63.73 kg)  06/01/15 145 lb 11.6 oz (66.1 kg)     Lab Results  Component Value Date   WBC 6.3 07/20/2015   HGB 12.7 07/20/2015   HCT 38.5 07/20/2015   PLT 291.0 07/20/2015   GLUCOSE 102* 07/20/2015   CHOL 216* 03/19/2015   TRIG 96.0 03/19/2015   HDL 73.50 03/19/2015   LDLDIRECT 123.3 10/24/2012   LDLCALC 123* 03/19/2015   ALT 15 07/20/2015   AST 20 07/20/2015   NA 139 07/20/2015   K 3.5 07/20/2015   CL 105 07/20/2015   CREATININE 0.81 07/20/2015   BUN 25* 07/20/2015   CO2 27 07/20/2015   TSH 1.54 03/19/2015       Assessment & Plan:   Problem List Items Addressed This Visit    Back pain    Take tylenol scheduled.  Follow.        Dysphagia - Primary    Trouble swallowing and persistent acid reflux despite medication.  Add zantac.  Refer to GI for evaluation and question of need for EGD.       Relevant Orders   Ambulatory referral to Gastroenterology   GERD (gastroesophageal reflux disease)    Persistent reflux despite medication.  Add zantac in the evening.  Given persistent acid reflux and given dysphagia, refer to GI for evaluation and for possible EGD.        Relevant Orders   Ambulatory referral to Gastroenterology   Hypercholesterolemia    Low cholesterol diet and exercise.  Follow lipid panel and liver function tests.        Relevant Orders   Lipid panel   Hepatic function panel   Hypertension    Blood pressure  under good control.  Continue same medication regimen.  Follow pressures.  Follow metabolic panel.        Relevant Orders   Basic metabolic panel   Hypothyroidism    On thyroid replacement.  Follow tsh.        Relevant Orders   TSH   Impacted cerumen    Involving both ears.  Debrox as directed.  Return for ear irrigation.        Lymphoma ([facility]) (Chronic)    Followed by Dr Oliva Bustard.        Renal mass    Evaluated  by urology and Dr Oliva Bustard.  Followed by urology.       Urinary frequency    Check urinalysis and culture to see if infection present.  Follow.        Relevant Orders   Urinalysis, Routine w reflex microscopic (not at [facility]) (Completed)   CULTURE, URINE COMPREHENSIVE (Completed)       Einar Pheasant, MD

## 2015-10-20 NOTE — Patient Instructions (Signed)
Zantac (ranitidine) 150mg  - take one tablet before your evening meal.

## 2015-10-20 NOTE — Progress Notes (Signed)
Pre-visit discussion using our clinic review tool. No additional management support is needed unless otherwise documented below in the visit note.  

## 2015-10-23 ENCOUNTER — Telehealth: Payer: Self-pay | Admitting: Internal Medicine

## 2015-10-23 LAB — CULTURE, URINE COMPREHENSIVE: Colony Count: >=100000

## 2015-10-23 MED ORDER — CIPROFLOXACIN HCL 250 MG PO TABS: 250.0000 mg | ORAL_TABLET | Freq: Two times a day (BID) | ORAL | Status: DC

## 2015-10-23 NOTE — Telephone Encounter (Signed)
Pt notified of urine culture results and need for abx.  Confirm nkda.  Start cipro 250mg  bid x 5 days.  rx send in to [facility] for cipro.

## 2015-10-30 ENCOUNTER — Encounter: Payer: Self-pay | Admitting: Internal Medicine

## 2015-10-30 DIAGNOSIS — R1310 Dysphagia, unspecified: Secondary | ICD-10-CM | POA: Insufficient documentation

## 2015-10-30 DIAGNOSIS — R350 Frequency of micturition: Secondary | ICD-10-CM | POA: Insufficient documentation

## 2015-10-30 NOTE — Assessment & Plan Note (Signed)
Evaluated by urology and Dr Oliva Bustard.  Followed by urology.

## 2015-10-30 NOTE — Assessment & Plan Note (Signed)
Check urinalysis and culture to see if infection present.  Follow.

## 2015-10-30 NOTE — Assessment & Plan Note (Signed)
Followed by Dr Choksi.  

## 2015-10-30 NOTE — Assessment & Plan Note (Signed)
Trouble swallowing and persistent acid reflux despite medication.  Add zantac.  Refer to GI for evaluation and question of need for EGD.

## 2015-10-30 NOTE — Assessment & Plan Note (Signed)
Involving both ears.  Debrox as directed.  Return for ear irrigation.

## 2015-10-30 NOTE — Assessment & Plan Note (Signed)
Persistent reflux despite medication.  Add zantac in the evening.  Given persistent acid reflux and given dysphagia, refer to GI for evaluation and for possible EGD.

## 2015-10-30 NOTE — Assessment & Plan Note (Signed)
On thyroid replacement.  Follow tsh.  

## 2015-10-30 NOTE — Assessment & Plan Note (Signed)
Take tylenol scheduled.  Follow.

## 2015-10-30 NOTE — Assessment & Plan Note (Signed)
Blood pressure under good control.  Continue same medication regimen.  Follow pressures.  Follow metabolic panel.   

## 2015-10-30 NOTE — Assessment & Plan Note (Signed)
Low cholesterol diet and exercise.  Follow lipid panel and liver function tests.  

## 2015-11-03 ENCOUNTER — Ambulatory Visit (INDEPENDENT_AMBULATORY_CARE_PROVIDER_SITE_OTHER): Payer: Medicare Other

## 2015-11-03 ENCOUNTER — Other Ambulatory Visit (INDEPENDENT_AMBULATORY_CARE_PROVIDER_SITE_OTHER): Payer: Medicare Other

## 2015-11-03 DIAGNOSIS — H6123 Impacted cerumen, bilateral: Secondary | ICD-10-CM

## 2015-11-03 DIAGNOSIS — E7800 Pure hypercholesterolemia, unspecified: Secondary | ICD-10-CM

## 2015-11-03 DIAGNOSIS — E039 Hypothyroidism, unspecified: Secondary | ICD-10-CM | POA: Diagnosis not present

## 2015-11-03 DIAGNOSIS — I10 Essential (primary) hypertension: Secondary | ICD-10-CM | POA: Diagnosis not present

## 2015-11-03 LAB — BASIC METABOLIC PANEL
BUN: 16 mg/dL (ref 6–23)
CALCIUM: 9.3 mg/dL (ref 8.4–10.5)
CO2: 30 meq/L (ref 19–32)
Chloride: 104 mEq/L (ref 96–112)
Creatinine, Ser: 0.7 mg/dL (ref 0.40–1.20)
GFR: 84.51 mL/min (ref >60.00)
GLUCOSE: 97 mg/dL (ref 70–99)
POTASSIUM: 4.4 meq/L (ref 3.5–5.1)
Sodium: 139 mEq/L (ref 135–145)

## 2015-11-03 LAB — TSH: TSH: 1.31 u[IU]/mL (ref 0.35–4.50)

## 2015-11-03 LAB — HEPATIC FUNCTION PANEL
ALBUMIN: 4 g/dL (ref 3.5–5.2)
ALT: 16 U/L (ref 0–35)
AST: 19 U/L (ref 0–37)
Alkaline Phosphatase: 50 U/L (ref 39–117)
BILIRUBIN DIRECT: 0.1 mg/dL (ref 0.0–0.3)
TOTAL PROTEIN: 7.2 g/dL (ref 6.0–8.3)
Total Bilirubin: 0.5 mg/dL (ref 0.2–1.2)

## 2015-11-03 LAB — LIPID PANEL
CHOL/HDL RATIO: 3
CHOLESTEROL: 197 mg/dL (ref 0–200)
HDL: 76.8 mg/dL (ref >39.00)
LDL Cholesterol: 110 mg/dL — ABNORMAL HIGH (ref 0–99)
NonHDL: 120.23
TRIGLYCERIDES: 53 mg/dL (ref 0.0–149.0)
VLDL: 10.6 mg/dL (ref 0.0–40.0)

## 2015-11-03 NOTE — Progress Notes (Signed)
Patient came in for ear irrigation.   At last visit on 10/20/15 noted to have cerumen in bilateral ears. Patient states that she has been using the ear drops since her last visit.  Inspected her left ear, noted large amounts of yellow/white wax.  Irrigated with warm water and peroxide.  Copious amounts of wax removed.  Ear drum visualized after irrigation, not wax noted.  Inspected right ear, irrigated  And removed copious amounts of dark orange thick wax.  Ear inspected post irrigation and clear.    Please advise.

## 2015-11-04 ENCOUNTER — Encounter: Payer: Self-pay | Admitting: *Deleted

## 2015-11-24 ENCOUNTER — Other Ambulatory Visit: Payer: Self-pay | Admitting: Gastroenterology

## 2015-11-24 DIAGNOSIS — R1310 Dysphagia, unspecified: Secondary | ICD-10-CM | POA: Diagnosis not present

## 2015-11-24 DIAGNOSIS — R1013 Epigastric pain: Secondary | ICD-10-CM

## 2015-12-01 ENCOUNTER — Other Ambulatory Visit: Payer: Medicare Other

## 2015-12-01 ENCOUNTER — Ambulatory Visit: Payer: Medicare Other

## 2015-12-01 ENCOUNTER — Ambulatory Visit: Payer: Medicare Other | Admitting: Oncology

## 2015-12-02 ENCOUNTER — Ambulatory Visit
Admission: RE | Admit: 2015-12-02 | Discharge: 2015-12-02 | Disposition: A | Discharge status: Home / Self Care | Payer: Medicare Other | Source: Ambulatory Visit | Attending: Gastroenterology | Admitting: Gastroenterology

## 2015-12-02 DIAGNOSIS — R1013 Epigastric pain: Secondary | ICD-10-CM | POA: Diagnosis present

## 2015-12-02 DIAGNOSIS — K219 Gastro-esophageal reflux disease without esophagitis: Secondary | ICD-10-CM | POA: Diagnosis not present

## 2015-12-02 DIAGNOSIS — R1310 Dysphagia, unspecified: Secondary | ICD-10-CM | POA: Insufficient documentation

## 2015-12-13 ENCOUNTER — Ambulatory Visit: Payer: Medicare Other | Admitting: Oncology

## 2015-12-13 ENCOUNTER — Other Ambulatory Visit: Payer: Medicare Other

## 2015-12-14 ENCOUNTER — Other Ambulatory Visit: Payer: Self-pay | Admitting: *Deleted

## 2015-12-14 DIAGNOSIS — C8590 Non-Hodgkin lymphoma, unspecified, unspecified site: Secondary | ICD-10-CM

## 2015-12-16 ENCOUNTER — Other Ambulatory Visit: Payer: Self-pay | Admitting: Internal Medicine

## 2015-12-22 ENCOUNTER — Inpatient Hospital Stay: Payer: Medicare Other

## 2015-12-22 ENCOUNTER — Encounter: Payer: Self-pay | Admitting: Oncology

## 2015-12-22 ENCOUNTER — Inpatient Hospital Stay: Payer: Medicare Other | Attending: Oncology | Admitting: Oncology

## 2015-12-22 VITALS — BP 146/67 | HR 57 | Temp 97.2°F | Wt 147.1 lb

## 2015-12-22 DIAGNOSIS — I10 Essential (primary) hypertension: Secondary | ICD-10-CM

## 2015-12-22 DIAGNOSIS — E7800 Pure hypercholesterolemia, unspecified: Secondary | ICD-10-CM | POA: Insufficient documentation

## 2015-12-22 DIAGNOSIS — K219 Gastro-esophageal reflux disease without esophagitis: Secondary | ICD-10-CM | POA: Diagnosis not present

## 2015-12-22 DIAGNOSIS — Z8572 Personal history of non-Hodgkin lymphomas: Secondary | ICD-10-CM | POA: Insufficient documentation

## 2015-12-22 DIAGNOSIS — Z79899 Other long term (current) drug therapy: Secondary | ICD-10-CM | POA: Diagnosis not present

## 2015-12-22 DIAGNOSIS — M129 Arthropathy, unspecified: Secondary | ICD-10-CM | POA: Diagnosis not present

## 2015-12-22 DIAGNOSIS — D649 Anemia, unspecified: Secondary | ICD-10-CM | POA: Diagnosis not present

## 2015-12-22 DIAGNOSIS — Z9221 Personal history of antineoplastic chemotherapy: Secondary | ICD-10-CM | POA: Insufficient documentation

## 2015-12-22 DIAGNOSIS — E039 Hypothyroidism, unspecified: Secondary | ICD-10-CM | POA: Insufficient documentation

## 2015-12-22 DIAGNOSIS — C8590 Non-Hodgkin lymphoma, unspecified, unspecified site: Secondary | ICD-10-CM

## 2015-12-22 LAB — CBC WITH DIFFERENTIAL/PLATELET
Basophils Absolute: 0.1 10*3/uL (ref 0–0.1)
Basophils Relative: 1 %
Eosinophils Absolute: 0.2 10*3/uL (ref 0–0.7)
Eosinophils Relative: 2 %
HEMATOCRIT: 36.9 % (ref 35.0–47.0)
Hemoglobin: 12 g/dL (ref 12.0–16.0)
LYMPHS ABS: 2 10*3/uL (ref 1.0–3.6)
LYMPHS PCT: 29 %
MCH: 30 pg (ref 26.0–34.0)
MCHC: 32.6 g/dL (ref 32.0–36.0)
MCV: 92.1 fL (ref 80.0–100.0)
MONO ABS: 0.7 10*3/uL (ref 0.2–0.9)
MONOS PCT: 11 %
NEUTROS ABS: 3.9 10*3/uL (ref 1.4–6.5)
Neutrophils Relative %: 57 %
Platelets: 284 10*3/uL (ref 150–440)
RBC: 4.01 MIL/uL (ref 3.80–5.20)
RDW: 13.9 % (ref 11.5–14.5)
WBC: 6.9 10*3/uL (ref 3.6–11.0)

## 2015-12-22 LAB — COMPREHENSIVE METABOLIC PANEL
ALK PHOS: 53 U/L (ref 38–126)
ALT: 15 U/L (ref 14–54)
ANION GAP: 6 (ref 5–15)
AST: 16 U/L (ref 15–41)
Albumin: 3.9 g/dL (ref 3.5–5.0)
BILIRUBIN TOTAL: 0.4 mg/dL (ref 0.3–1.2)
BUN: 15 mg/dL (ref 6–20)
CALCIUM: 8.7 mg/dL — AB (ref 8.9–10.3)
CO2: 27 mmol/L (ref 22–32)
Chloride: 107 mmol/L (ref 101–111)
Creatinine, Ser: 0.76 mg/dL (ref 0.44–1.00)
GFR calc Af Amer: >60 mL/min (ref >60)
Glucose, Bld: 114 mg/dL — ABNORMAL HIGH (ref 65–99)
POTASSIUM: 4.4 mmol/L (ref 3.5–5.1)
Sodium: 140 mmol/L (ref 135–145)
TOTAL PROTEIN: 7.1 g/dL (ref 6.5–8.1)

## 2015-12-22 NOTE — Progress Notes (Signed)
[place] @ [facility] [phone]  Fax:([phone]) [place] OB: [birth year]  MR#: [id number]  [id number]  Patient Care Team: Einar Pheasant, MD as PCP - General (Internal Medicine) Einar Pheasant, MD (Internal Medicine)  CHIEF COMPLAINT:  No chief complaint on file.   Oncology History   Follicular lymphoma stage IIIB.  Status post Rituxan therapy . 's Diagnosis in August of 2014     Lymphoma [facility])    INTERVAL HISTORY:  82 year old lady came today further follow-up regarding stage IIIB follicular lymphoma.  Patient has been treated with Rituxan as a single agent treatment significant improvement in symptoms and PET scan.  Patient continues to have arthritis pain limiting ambulation Patient is here for ongoing evaluation.  Recently patient had upper GI series done because of difficulty swallowing and gastroesophageal reflux was found. Patient does not want any further intervention and any further x-ray.  No chills.  No fever.  Here for further follow-up regarding follicular lymphoma   REVIEW OF SYSTEMS:   Gen. Status: Patient is in wheelchair because of arthritis pain HEENT: No headache dizziness Lungs: Shortness of breath on exertion Cardiac: No chest pain History of coronary artery disease Patient has hypothyroidism GI: No nausea no vomiting no diarrhea GU: No dysuria hematuria Skin: No rash  Musculoskeletal system diffuse joint pain As per HPI. Otherwise, a complete review of systems is negatve.  PAST MEDICAL HISTORY: Past Medical History  Diagnosis Date  . Anemia   . Hypothyroidism   . Hypercholesterolemia   . Hypertension   . GERD (gastroesophageal reflux disease)     PAST SURGICAL HISTORY: Past Surgical History  Procedure Laterality Date  . Abdominal hysterectomy  1960s    bladder tack at the same time, ovaries not removed  . Laparoscopic cholecystectomy  5/11    FAMILY HISTORY Family History  Problem Relation Age  of Onset  . Lymphoma Brother   . Diabetes Brother     x2  . Thyroid disease Brother     ADVANCED DIRECTIVES:  Patient does not have a living will.  Information was given HEALTH MAINTENANCE: Social History  Substance Use Topics  . Smoking status: Never Smoker   . Smokeless tobacco: Never Used  . Alcohol Use: No      Allergies  Allergen Reactions  . No Known Drug Allergy     Current Outpatient Prescriptions  Medication Sig Dispense Refill  . acetaminophen (TYLENOL) 500 MG tablet Take 500 mg by mouth every 6 (six) hours as needed.    . ciprofloxacin (CIPRO) 250 MG tablet Take 1 tablet (250 mg total) by mouth 2 (two) times daily. 10 tablet 0  . Homeopathic Products ([employer] LEG CRAMPS PAIN RELIEF PO) Take by mouth as needed.    Marland Kitchen levothyroxine (SYNTHROID, LEVOTHROID) 75 MCG tablet TAKE ONE TABLET BY MOUTH ONCE DAILY 90 tablet 3  . lisinopril (PRINIVIL,ZESTRIL) 10 MG tablet TAKE ONE TABLET BY MOUTH ONCE DAILY 30 tablet 4  . pantoprazole (PROTONIX) 40 MG tablet Take 1 tablet (40 mg total) by mouth daily. 30 tablet 5  . traMADol (ULTRAM) 50 MG tablet Take 1 tablet (50 mg total) by mouth at bedtime as needed. 30 tablet 3   No current facility-administered medications for this visit.    OBJECTIVE:  There were no vitals filed for this visit.   There is no weight on file to calculate BMI.    ECOG FS:1 - Symptomatic but completely ambulatory  PHYSICAL EXAM: Gen. Status: Patient is  in wheelchair because of arthritis pain Lymphatic system: Supraclavicular, cervical, axillary, inguinal lymph nodes are not palpable Cardiac: Tachycardia soft systolic murmur Examination of the chest was unremarkable. There were no bony deformities, no asymmetry, and no other abnormalities. Abdominal exam revealed normal bowel sounds. The abdomen was soft, non-tender, and without masses, organomegaly, or appreciable enlargement of the abdominal aorta. Examination of the skin revealed no evidence of significant  rashes, suspicious appearing nevi or other concerning lesions. Musculoskeletal system joint pains and joint deformity most likely due to osteoarthritis Neurologically, the patient was awake, alert, and oriented to person, place and time. There were no obvious focal neurologic abnormalities. Examination of the skin revealed no evidence of significant rashes, suspicious appearing nevi or other concerning lesions.   LAB RESULTS:  No visits with results within 2 Day(s) from this visit. Latest known visit with results is:  Lab on 11/03/2015  Component Date Value Ref Range Status  . Cholesterol 11/03/2015 197  0 - 200 mg/dL Final   ATP III Classification       Desirable:  < 200 mg/dL               Borderline High:  200 - 239 mg/dL          High:  > = 240 mg/dL  . Triglycerides 11/03/2015 53.0  0.0 - 149.0 mg/dL Final   Normal:  <150 mg/dLBorderline High:  150 - 199 mg/dL  . HDL 11/03/2015 76.80  >39.00 mg/dL Final  . VLDL 11/03/2015 10.6  0.0 - 40.0 mg/dL Final  . LDL Cholesterol 11/03/2015 110* 0 - 99 mg/dL Final  . Total CHOL/HDL Ratio 11/03/2015 3   Final                  Men          Women1/2 Average Risk     3.4          3.3Average Risk          5.0          4.42X Average Risk          9.6          7.13X Average Risk          15.0          11.0                      . NonHDL 11/03/2015 120.23   Final   NOTE:  Non-HDL goal should be 30 mg/dL higher than patient's LDL goal (i.e. LDL goal of < 70 mg/dL, would have non-HDL goal of < 100 mg/dL)  . Total Bilirubin 11/03/2015 0.5  0.2 - 1.2 mg/dL Final  . Bilirubin, Direct 11/03/2015 0.1  0.0 - 0.3 mg/dL Final  . Alkaline Phosphatase 11/03/2015 50  39 - 117 U/L Final  . AST 11/03/2015 19  0 - 37 U/L Final  . ALT 11/03/2015 16  0 - 35 U/L Final  . Total Protein 11/03/2015 7.2  6.0 - 8.3 g/dL Final  . Albumin 11/03/2015 4.0  3.5 - 5.2 g/dL Final  . Sodium 11/03/2015 139  135 - 145 mEq/L Final  . Potassium 11/03/2015 4.4  3.5 - 5.1 mEq/L Final    . Chloride 11/03/2015 104  96 - 112 mEq/L Final  . CO2 11/03/2015 30  19 - 32 mEq/L Final  . Glucose, Bld 11/03/2015 97  70 - 99 mg/dL Final  . BUN 11/03/2015 16  6 - 23 mg/dL Final  . Creatinine, Ser 11/03/2015 0.70  0.40 - 1.20 mg/dL Final  . Calcium 11/03/2015 9.3  8.4 - 10.5 mg/dL Final  . GFR 11/03/2015 84.51  >60.00 mL/min Final  . TSH 11/03/2015 1.31  0.35 - 4.50 uIU/mL Final     ASSESSMENT: Follicular lymphoma stage IIIB Status post Rituxan therapy Generalized arthritis Sleep disturbances On clinical examination there is no evidence of recurrent disease Had upper GI series done by gastroenterologist which has been reported to be negative for any malignancy.  Gastroesophageal reflux disease was found. Patient does not want any further evaluation with CT scan or a PET scan   MEDICAL DECISION MAKING:  All lab data has been reviewed.  There is no evidence of recurrent lymphoma. Continue follow-up without any intervention.  Patient is refusing all other evaluation  Patient expressed understanding and was in agreement with this plan. She also understands that She can call clinic at any time with any questions, concerns, or complaints.    No matching staging information was found for the patient.  Forest Gleason, MD   12/22/2015 3:34 PM

## 2015-12-23 ENCOUNTER — Encounter: Payer: Self-pay | Admitting: Oncology

## 2016-01-03 DIAGNOSIS — R1310 Dysphagia, unspecified: Secondary | ICD-10-CM | POA: Diagnosis not present

## 2016-03-20 ENCOUNTER — Ambulatory Visit (INDEPENDENT_AMBULATORY_CARE_PROVIDER_SITE_OTHER): Payer: Medicare Other | Admitting: Internal Medicine

## 2016-03-20 ENCOUNTER — Encounter: Payer: Self-pay | Admitting: Internal Medicine

## 2016-03-20 VITALS — BP 122/70 | HR 74 | Temp 97.8°F | Resp 18 | Ht 63.0 in | Wt 148.1 lb

## 2016-03-20 DIAGNOSIS — N2889 Other specified disorders of kidney and ureter: Secondary | ICD-10-CM

## 2016-03-20 DIAGNOSIS — E039 Hypothyroidism, unspecified: Secondary | ICD-10-CM

## 2016-03-20 DIAGNOSIS — C8220 Follicular lymphoma grade III, unspecified, unspecified site: Secondary | ICD-10-CM

## 2016-03-20 DIAGNOSIS — R739 Hyperglycemia, unspecified: Secondary | ICD-10-CM | POA: Diagnosis not present

## 2016-03-20 DIAGNOSIS — K219 Gastro-esophageal reflux disease without esophagitis: Secondary | ICD-10-CM

## 2016-03-20 DIAGNOSIS — I10 Essential (primary) hypertension: Secondary | ICD-10-CM

## 2016-03-20 DIAGNOSIS — E7800 Pure hypercholesterolemia, unspecified: Secondary | ICD-10-CM

## 2016-03-20 DIAGNOSIS — M545 Low back pain: Secondary | ICD-10-CM

## 2016-03-20 DIAGNOSIS — Z0000 Encounter for general adult medical examination without abnormal findings: Secondary | ICD-10-CM

## 2016-03-20 LAB — BASIC METABOLIC PANEL
BUN: 15 mg/dL (ref 6–23)
CHLORIDE: 108 meq/L (ref 96–112)
CO2: 26 meq/L (ref 19–32)
CREATININE: 0.77 mg/dL (ref 0.40–1.20)
Calcium: 9 mg/dL (ref 8.4–10.5)
GFR: 75.64 mL/min (ref >60.00)
Glucose, Bld: 89 mg/dL (ref 70–99)
POTASSIUM: 4.1 meq/L (ref 3.5–5.1)
SODIUM: 142 meq/L (ref 135–145)

## 2016-03-20 LAB — HEMOGLOBIN A1C: HEMOGLOBIN A1C: 6.1 % (ref 4.6–6.5)

## 2016-03-20 MED ORDER — TRAMADOL HCL 50 MG PO TABS: 50.0000 mg | ORAL_TABLET | Freq: Every evening | ORAL | Status: DC | PRN

## 2016-03-20 MED ORDER — PANTOPRAZOLE SODIUM 40 MG PO TBEC: 40.0000 mg | DELAYED_RELEASE_TABLET | Freq: Every day | ORAL | Status: DC

## 2016-03-20 NOTE — Progress Notes (Signed)
Pre-visit discussion using our clinic review tool. No additional management support is needed unless otherwise documented below in the visit note.  

## 2016-03-20 NOTE — Progress Notes (Signed)
Patient ID: Tasha Avila, female   DOB: 07-07-1928, 82 y.o.   MRN: [id number]   Subjective:    Patient ID: Tasha Avila, female    DOB: [birth year], 82 y.o.   MRN: [id number]  HPI  Patient with past history of hypercholesterolemia, GERD, hypothyroidism and hypertension.  She comes in today to follow up on these issues.  She is accompanied by her daughter.  History obtained from both of them.  Her main complaint is that of back and joint pain.  Out of tramadol.  Tramadol helps her relax at night.  She tolerates the medication without adverse side effects.  She only takes tylenol when the pain gets really bad.  She is eating.  No problems swallowing now.  No abdominal pain or cramping. Bowels stable.      Past Medical History  Diagnosis Date  . Anemia   . Hypothyroidism   . Hypercholesterolemia   . Hypertension   . GERD (gastroesophageal reflux disease)    Past Surgical History  Procedure Laterality Date  . Abdominal hysterectomy  1960s    bladder tack at the same time, ovaries not removed  . Laparoscopic cholecystectomy  5/11   Family History  Problem Relation Age of Onset  . Lymphoma Brother   . Diabetes Brother     x2  . Thyroid disease Brother    Social History   Social History  . Marital Status: Married    Spouse Name: N/A  . Number of Children: 2  . Years of Education: N/A   Social History Main Topics  . Smoking status: Never Smoker   . Smokeless tobacco: Never Used  . Alcohol Use: No  . Drug Use: No  . Sexual Activity: Not Asked   Other Topics Concern  . None   Social History Narrative    Outpatient Encounter Prescriptions as of 03/20/2016  Medication Sig  . acetaminophen (TYLENOL) 500 MG tablet Take 500 mg by mouth every 6 (six) hours as needed.  . Homeopathic Products ([employer] LEG CRAMPS PAIN RELIEF PO) Take by mouth as needed.  Marland Kitchen levothyroxine (SYNTHROID, LEVOTHROID) 75 MCG tablet TAKE ONE TABLET BY MOUTH ONCE DAILY  . lisinopril  (PRINIVIL,ZESTRIL) 10 MG tablet TAKE ONE TABLET BY MOUTH ONCE DAILY  . pantoprazole (PROTONIX) 40 MG tablet Take 1 tablet (40 mg total) by mouth daily.  . [DISCONTINUED] pantoprazole (PROTONIX) 40 MG tablet Take 1 tablet (40 mg total) by mouth daily.  . traMADol (ULTRAM) 50 MG tablet Take 1 tablet (50 mg total) by mouth at bedtime as needed.  . [DISCONTINUED] traMADol (ULTRAM) 50 MG tablet Take 1 tablet (50 mg total) by mouth at bedtime as needed. (Patient not taking: Reported on 03/20/2016)   No facility-administered encounter medications on file as of 03/20/2016.    Review of Systems  Constitutional: Negative for appetite change and unexpected weight change.  HENT: Negative for congestion and sinus pressure.   Eyes: Negative for pain and visual disturbance.  Respiratory: Negative for cough, chest tightness and shortness of breath.   Cardiovascular: Negative for chest pain, palpitations and leg swelling.  Gastrointestinal: Negative for nausea, vomiting, abdominal pain and diarrhea.  Genitourinary: Negative for dysuria and difficulty urinating.  Musculoskeletal: Positive for back pain.       Joint pain.  Skin: Negative for color change and rash.  Neurological: Negative for dizziness, light-headedness and headaches.  Hematological: Negative for adenopathy. Does not bruise/bleed easily.  Psychiatric/Behavioral: Negative for dysphoric mood and agitation.  Objective:    Physical Exam  Constitutional: She is oriented to person, place, and time. She appears well-developed and well-nourished. No distress.  HENT:  Nose: Nose normal.  Mouth/Throat: Oropharynx is clear and moist.  Eyes: Right eye exhibits no discharge. Left eye exhibits no discharge. No scleral icterus.  Neck: Neck supple. No thyromegaly present.  Cardiovascular: Normal rate and regular rhythm.   Pulmonary/Chest: Breath sounds normal. No accessory muscle usage. No tachypnea. No respiratory distress. She has no decreased  breath sounds. She has no wheezes. She has no rhonchi. Right breast exhibits no inverted nipple, no mass, no nipple discharge and no tenderness (no axillary adenopathy). Left breast exhibits no inverted nipple, no mass, no nipple discharge and no tenderness (no axilarry adenopathy).  Abdominal: Soft. Bowel sounds are normal. There is no tenderness.  Musculoskeletal: She exhibits no edema or tenderness.  Lymphadenopathy:    She has no cervical adenopathy.  Neurological: She is alert and oriented to person, place, and time.  Skin: Skin is warm. No rash noted. No erythema.  Psychiatric: She has a normal mood and affect. Her behavior is normal.    BP 122/70 mmHg  Pulse 74  Temp(Src) 97.8 F (36.6 C) (Oral)  Resp 18  Ht 5\' 3"  (1.6 m)  Wt 148 lb 2 oz (67.189 kg)  BMI 26.25 kg/m2  SpO2 93% Wt Readings from Last 3 Encounters:  03/20/16 148 lb 2 oz (67.189 kg)  12/22/15 147 lb 1 oz (66.707 kg)  10/20/15 147 lb (66.679 kg)     Lab Results  Component Value Date   WBC 6.9 12/22/2015   HGB 12.0 12/22/2015   HCT 36.9 12/22/2015   PLT 284 12/22/2015   GLUCOSE 89 03/20/2016   CHOL 197 11/03/2015   TRIG 53.0 11/03/2015   HDL 76.80 11/03/2015   LDLDIRECT 123.3 10/24/2012   LDLCALC 110* 11/03/2015   ALT 15 12/22/2015   AST 16 12/22/2015   NA 142 03/20/2016   K 4.1 03/20/2016   CL 108 03/20/2016   CREATININE 0.77 03/20/2016   BUN 15 03/20/2016   CO2 26 03/20/2016   TSH 1.31 11/03/2015   HGBA1C 6.1 03/20/2016    Dg Ugi W/o Kub  12/02/2015  CLINICAL DATA:  Dysphagia, acid reflux EXAM: UPPER GI SERIES WITHOUT KUB TECHNIQUE: Routine upper GI series was performed with thick and thin barium. FLUOROSCOPY TIME:  Radiation Exposure Index (as provided by the fluoroscopic device): 5.8 mGy COMPARISON:  None. FINDINGS: Limited evaluation secondary to difficulty in patient positioning due to severe kyphosis and back pain. Examination of the esophagus demonstrated normal esophageal motility. Normal  esophageal morphology without evidence of esophagitis or ulceration. No esophageal stricture, diverticula, or mass lesion. No evidence of hiatal hernia. There is severe gastroesophageal reflux. Examination of the stomach demonstrated normal rugal folds and areae gastricae. The gastric mucosa appeared unremarkable without evidence of ulceration, scarring, or mass lesion. Gastric motility and emptying was normal. Fluoroscopic examination of the duodenum demonstrates normal motility and morphology without evidence of ulceration or mass lesion. At the end of the examination a 13 mm barium tablet was administered which transited through the esophagus and esophagogastric junction without delay. IMPRESSION: 1. Severe gastroesophageal reflux. Electronically Signed   By: Kathreen Devoid   On: 12/02/2015 11:57       Assessment & Plan:   Problem List Items Addressed This Visit    Back pain    Persistent pain as outlined.  Tramadol rx refilled.  Will take at night.  Tylenol scheduled as outlined.  Follow.       Relevant Medications   traMADol (ULTRAM) 50 MG tablet   GERD (gastroesophageal reflux disease)    Recently saw GI.  Not having dysphagia now.  Recent upper GI revealed increased acid reflux.  She states is controlled on current regimen  Follow.       Relevant Medications   pantoprazole (PROTONIX) 40 MG tablet   Health care maintenance    Physical today 03/20/16.  Declines mammogram, bone densiity.        Hypertension - Primary    Blood pressure under good control.  Continue same medication regimen.  Follow pressures.  Follow metabolic panel.        Relevant Orders   Basic metabolic panel (Completed)   Hypothyroidism    On thyroid replacement.  Follow tsh.        Non-Hodgkin's lymphoma ([facility])    Followed by Dr Oliva Bustard.  Recently evaluated.  Note reviewed.       Relevant Medications   traMADol (ULTRAM) 50 MG tablet   Pure hypercholesterolemia    Low cholesterol diet and exercise.  Follow  lipid panel.       Renal mass    Evaluated by urology and oncology.  See Dr Metro Kung recent note.  Follow.        Other Visit Diagnoses    Hyperglycemia        Relevant Orders    Hemoglobin A1c (Completed)        Einar Pheasant, MD

## 2016-03-21 ENCOUNTER — Other Ambulatory Visit: Payer: Self-pay | Admitting: Oncology

## 2016-03-21 ENCOUNTER — Encounter: Payer: Self-pay | Admitting: *Deleted

## 2016-03-27 ENCOUNTER — Encounter: Payer: Self-pay | Admitting: Internal Medicine

## 2016-03-27 NOTE — Assessment & Plan Note (Signed)
Followed by Dr Oliva Bustard.  Recently evaluated.  Note reviewed.

## 2016-03-27 NOTE — Assessment & Plan Note (Signed)
Blood pressure under good control.  Continue same medication regimen.  Follow pressures.  Follow metabolic panel.   

## 2016-03-27 NOTE — Assessment & Plan Note (Signed)
Physical today 03/20/16.  Declines mammogram, bone densiity.

## 2016-03-27 NOTE — Assessment & Plan Note (Signed)
Persistent pain as outlined.  Tramadol rx refilled.  Will take at night.  Tylenol scheduled as outlined.  Follow.

## 2016-03-27 NOTE — Assessment & Plan Note (Signed)
Low cholesterol diet and exercise.  Follow lipid panel.   

## 2016-03-27 NOTE — Assessment & Plan Note (Signed)
On thyroid replacement.  Follow tsh.  

## 2016-03-27 NOTE — Assessment & Plan Note (Signed)
Evaluated by urology and oncology.  See Dr Metro Kung recent note.  Follow.

## 2016-03-27 NOTE — Assessment & Plan Note (Signed)
Recently saw GI.  Not having dysphagia now.  Recent upper GI revealed increased acid reflux.  She states is controlled on current regimen  Follow.

## 2016-05-30 ENCOUNTER — Other Ambulatory Visit: Payer: Self-pay | Admitting: Internal Medicine

## 2016-06-21 ENCOUNTER — Other Ambulatory Visit: Payer: Medicare Other

## 2016-06-21 ENCOUNTER — Ambulatory Visit: Payer: Medicare Other | Admitting: Oncology

## 2016-07-04 ENCOUNTER — Other Ambulatory Visit: Payer: Medicare Other

## 2016-07-04 ENCOUNTER — Ambulatory Visit: Payer: Medicare Other | Admitting: Oncology

## 2016-07-20 ENCOUNTER — Ambulatory Visit (INDEPENDENT_AMBULATORY_CARE_PROVIDER_SITE_OTHER): Payer: Medicare Other | Admitting: Internal Medicine

## 2016-07-20 ENCOUNTER — Encounter: Payer: Self-pay | Admitting: Internal Medicine

## 2016-07-20 VITALS — BP 130/70 | HR 56 | Wt 145.0 lb

## 2016-07-20 DIAGNOSIS — R300 Dysuria: Secondary | ICD-10-CM | POA: Diagnosis not present

## 2016-07-20 DIAGNOSIS — E039 Hypothyroidism, unspecified: Secondary | ICD-10-CM

## 2016-07-20 DIAGNOSIS — R739 Hyperglycemia, unspecified: Secondary | ICD-10-CM | POA: Diagnosis not present

## 2016-07-20 DIAGNOSIS — E7800 Pure hypercholesterolemia, unspecified: Secondary | ICD-10-CM | POA: Diagnosis not present

## 2016-07-20 DIAGNOSIS — I10 Essential (primary) hypertension: Secondary | ICD-10-CM | POA: Diagnosis not present

## 2016-07-20 DIAGNOSIS — N2889 Other specified disorders of kidney and ureter: Secondary | ICD-10-CM

## 2016-07-20 DIAGNOSIS — M545 Low back pain: Secondary | ICD-10-CM

## 2016-07-20 DIAGNOSIS — K219 Gastro-esophageal reflux disease without esophagitis: Secondary | ICD-10-CM

## 2016-07-20 DIAGNOSIS — C8220 Follicular lymphoma grade III, unspecified, unspecified site: Secondary | ICD-10-CM

## 2016-07-20 LAB — HEPATIC FUNCTION PANEL
ALT: 11 U/L (ref 0–35)
AST: 16 U/L (ref 0–37)
Albumin: 4.1 g/dL (ref 3.5–5.2)
Alkaline Phosphatase: 41 U/L (ref 39–117)
BILIRUBIN DIRECT: 0.1 mg/dL (ref 0.0–0.3)
BILIRUBIN TOTAL: 0.4 mg/dL (ref 0.2–1.2)
TOTAL PROTEIN: 6.8 g/dL (ref 6.0–8.3)

## 2016-07-20 LAB — BASIC METABOLIC PANEL
BUN: 12 mg/dL (ref 6–23)
CO2: 28 mEq/L (ref 19–32)
Calcium: 9.2 mg/dL (ref 8.4–10.5)
Chloride: 105 mEq/L (ref 96–112)
Creatinine, Ser: 0.88 mg/dL (ref 0.40–1.20)
GFR: 64.78 mL/min (ref >60.00)
GLUCOSE: 92 mg/dL (ref 70–99)
POTASSIUM: 4.4 meq/L (ref 3.5–5.1)
SODIUM: 139 meq/L (ref 135–145)

## 2016-07-20 LAB — URINALYSIS, ROUTINE W REFLEX MICROSCOPIC
BILIRUBIN URINE: NEGATIVE
Hgb urine dipstick: NEGATIVE
KETONES UR: NEGATIVE
NITRITE: NEGATIVE
PH: 6 (ref 5.0–8.0)
RBC / HPF: NONE SEEN (ref 0–?)
Specific Gravity, Urine: 1.01 (ref 1.000–1.030)
TOTAL PROTEIN, URINE-UPE24: NEGATIVE
URINE GLUCOSE: NEGATIVE
UROBILINOGEN UA: 0.2 (ref 0.0–1.0)

## 2016-07-20 LAB — TSH: TSH: 0.83 u[IU]/mL (ref 0.35–4.50)

## 2016-07-20 LAB — HEMOGLOBIN A1C: HEMOGLOBIN A1C: 5.9 % (ref 4.6–6.5)

## 2016-07-20 MED ORDER — TRAMADOL HCL 50 MG PO TABS: 50.0000 mg | ORAL_TABLET | Freq: Every evening | ORAL | 2 refills | Status: DC | PRN

## 2016-07-20 NOTE — Progress Notes (Signed)
Patient ID: Tasha Avila, female   DOB: June 13, 1928, 82 y.o.   MRN: [id number]   Subjective:    Patient ID: Tasha Avila, female    DOB: 1928/03/21, 82 y.o.   MRN: [id number]  HPI  Patient here for a scheduled follow up.  She is accompanied by her daughter.  History obtained from both of them.  She is eating.  Some acid reflux.  Taking protonix.  Feels this works pretty well for her.  No significant problems swallowing now.  Saw GI.  Desires no further w/up.  No chest pain.  No sob.  No abdominal pain or cramping.  Chronic msk pain.  Not taking tramadol.  Request refill.  Tolerates without adverse effects.  Concern over possible uti.  Wants urine checked.  Rash better.     Past Medical History:  Diagnosis Date  . Anemia   . GERD (gastroesophageal reflux disease)   . Hypercholesterolemia   . Hypertension   . Hypothyroidism    Past Surgical History:  Procedure Laterality Date  . ABDOMINAL HYSTERECTOMY  1960s   bladder tack at the same time, ovaries not removed  . LAPAROSCOPIC CHOLECYSTECTOMY  5/11   Family History  Problem Relation Age of Onset  . Lymphoma Brother   . Diabetes Brother     x2  . Thyroid disease Brother    Social History   Social History  . Marital status: Married    Spouse name: N/A  . Number of children: 2  . Years of education: N/A   Social History Main Topics  . Smoking status: Never Smoker  . Smokeless tobacco: Never Used  . Alcohol use No  . Drug use: No  . Sexual activity: Not Asked   Other Topics Concern  . None   Social History Narrative  . None    Outpatient Encounter Prescriptions as of 07/20/2016  Medication Sig  . acetaminophen (TYLENOL) 500 MG tablet Take 500 mg by mouth every 6 (six) hours as needed.  . Homeopathic Products ([employer] LEG CRAMPS PAIN RELIEF PO) Take by mouth as needed.  Marland Kitchen levothyroxine (SYNTHROID, LEVOTHROID) 75 MCG tablet Take 1 tablet (75 mcg total) by mouth daily.  Marland Kitchen lisinopril (PRINIVIL,ZESTRIL) 10 MG  tablet TAKE ONE TABLET BY MOUTH ONCE DAILY  . pantoprazole (PROTONIX) 40 MG tablet Take 1 tablet (40 mg total) by mouth daily.  . traMADol (ULTRAM) 50 MG tablet Take 1 tablet (50 mg total) by mouth at bedtime as needed.  . [DISCONTINUED] levothyroxine (SYNTHROID, LEVOTHROID) 75 MCG tablet TAKE ONE TABLET BY MOUTH ONCE DAILY  . [DISCONTINUED] traMADol (ULTRAM) 50 MG tablet Take 1 tablet (50 mg total) by mouth at bedtime as needed.  . ciprofloxacin (CIPRO) 250 MG tablet Take 1 tablet (250 mg total) by mouth 2 (two) times daily.   No facility-administered encounter medications on file as of 07/20/2016.     Review of Systems  Constitutional: Negative for appetite change and unexpected weight change.  HENT: Negative for congestion and sinus pressure.   Respiratory: Negative for cough, chest tightness and shortness of breath.   Cardiovascular: Negative for chest pain, palpitations and leg swelling.  Gastrointestinal: Negative for abdominal pain, diarrhea, nausea and vomiting.  Genitourinary: Negative for difficulty urinating.  Musculoskeletal:       Chronic pain as outlined.    Skin: Negative for color change and rash.  Neurological: Negative for dizziness, light-headedness and headaches.  Psychiatric/Behavioral: Negative for agitation and dysphoric mood.       Objective:  Physical Exam  Constitutional: She appears well-developed and well-nourished. No distress.  HENT:  Nose: Nose normal.  Mouth/Throat: Oropharynx is clear and moist.  Neck: Neck supple. No thyromegaly present.  Cardiovascular: Normal rate and regular rhythm.   Pulmonary/Chest: Breath sounds normal. No respiratory distress. She has no wheezes.  Abdominal: Soft. Bowel sounds are normal. There is no tenderness.  Musculoskeletal: She exhibits no edema or tenderness.  Lymphadenopathy:    She has no cervical adenopathy.  Skin: No rash noted. No erythema.  Psychiatric: She has a normal mood and affect. Her behavior is  normal.    BP 130/70   Pulse (!) 56   Wt 145 lb (65.8 kg)   SpO2 94%   BMI 25.69 kg/m  Wt Readings from Last 3 Encounters:  07/20/16 145 lb (65.8 kg)  03/20/16 148 lb 2 oz (67.2 kg)  12/22/15 147 lb 1 oz (66.7 kg)     Lab Results  Component Value Date   WBC 6.9 12/22/2015   HGB 12.0 12/22/2015   HCT 36.9 12/22/2015   PLT 284 12/22/2015   GLUCOSE 92 07/20/2016   CHOL 197 11/03/2015   TRIG 53.0 11/03/2015   HDL 76.80 11/03/2015   LDLDIRECT 123.3 10/24/2012   LDLCALC 110 (H) 11/03/2015   ALT 11 07/20/2016   AST 16 07/20/2016   NA 139 07/20/2016   K 4.4 07/20/2016   CL 105 07/20/2016   CREATININE 0.88 07/20/2016   BUN 12 07/20/2016   CO2 28 07/20/2016   TSH 0.83 07/20/2016   HGBA1C 5.9 07/20/2016    Dg Ugi W/o Kub  Result Date: 12/02/2015 CLINICAL DATA:  Dysphagia, acid reflux EXAM: UPPER GI SERIES WITHOUT KUB TECHNIQUE: Routine upper GI series was performed with thick and thin barium. FLUOROSCOPY TIME:  Radiation Exposure Index (as provided by the fluoroscopic device): 5.8 mGy COMPARISON:  None. FINDINGS: Limited evaluation secondary to difficulty in patient positioning due to severe kyphosis and back pain. Examination of the esophagus demonstrated normal esophageal motility. Normal esophageal morphology without evidence of esophagitis or ulceration. No esophageal stricture, diverticula, or mass lesion. No evidence of hiatal hernia. There is severe gastroesophageal reflux. Examination of the stomach demonstrated normal rugal folds and areae gastricae. The gastric mucosa appeared unremarkable without evidence of ulceration, scarring, or mass lesion. Gastric motility and emptying was normal. Fluoroscopic examination of the duodenum demonstrates normal motility and morphology without evidence of ulceration or mass lesion. At the end of the examination a 13 mm barium tablet was administered which transited through the esophagus and esophagogastric junction without delay.  IMPRESSION: 1. Severe gastroesophageal reflux. Electronically Signed   By: Kathreen Devoid   On: 12/02/2015 11:57       Assessment & Plan:   Problem List Items Addressed This Visit    Back pain    Chronic.  Requested refill for tramadol.  Desires no further intervention.        Relevant Medications   traMADol (ULTRAM) 50 MG tablet   GERD (gastroesophageal reflux disease)    Has seen GI.  No significant problems with swallowing now.  Continue protonix.  Follow.  Desires no further intervention.        Hypercholesterolemia   Relevant Orders   Hepatic function panel (Completed)   Hypertension    Blood pressure under good control.  Continue same medication regimen.  Follow pressures.  Follow metabolic panel.        Relevant Orders   Basic metabolic panel (Completed)   Hypothyroidism  On thyroid replacement.  Follow tsh.        Relevant Medications   levothyroxine (SYNTHROID, LEVOTHROID) 75 MCG tablet   Other Relevant Orders   TSH (Completed)   Non-Hodgkin's lymphoma ([place])    Followed by oncology.        Relevant Medications   traMADol (ULTRAM) 50 MG tablet   ciprofloxacin (CIPRO) 250 MG tablet   Pure hypercholesterolemia - Primary    Low cholesterol diet and exercise.  Follow lipid panel.       Renal mass    Evaluated by urology and oncology.  Follow.        Other Visit Diagnoses    Hyperglycemia       Relevant Orders   Hemoglobin A1c (Completed)   Dysuria       Relevant Orders   Urinalysis, Routine w reflex microscopic (not at [facility]) (Completed)   CULTURE, URINE COMPREHENSIVE (Completed)       Einar Pheasant, MD

## 2016-07-20 NOTE — Patient Instructions (Signed)
Zantac (ranitidine) 150mg  - take one tablet 30 minutes before evening meal

## 2016-07-21 DIAGNOSIS — H2513 Age-related nuclear cataract, bilateral: Secondary | ICD-10-CM | POA: Diagnosis not present

## 2016-07-23 LAB — CULTURE, URINE COMPREHENSIVE

## 2016-07-24 ENCOUNTER — Ambulatory Visit: Payer: Medicare Other | Admitting: Hematology and Oncology

## 2016-07-24 ENCOUNTER — Other Ambulatory Visit: Payer: Medicare Other

## 2016-07-24 MED ORDER — CIPROFLOXACIN HCL 250 MG PO TABS: 250.0000 mg | ORAL_TABLET | Freq: Two times a day (BID) | ORAL | 0 refills | Status: DC

## 2016-07-30 ENCOUNTER — Encounter: Payer: Self-pay | Admitting: Internal Medicine

## 2016-07-30 MED ORDER — LEVOTHYROXINE SODIUM 75 MCG PO TABS: 75.0000 ug | ORAL_TABLET | Freq: Every day | ORAL | 3 refills | Status: DC

## 2016-07-30 NOTE — Assessment & Plan Note (Signed)
Chronic.  Requested refill for tramadol.  Desires no further intervention.

## 2016-07-30 NOTE — Assessment & Plan Note (Signed)
Evaluated by urology and oncology.  Follow.

## 2016-07-30 NOTE — Assessment & Plan Note (Signed)
On thyroid replacement.  Follow tsh.  

## 2016-07-30 NOTE — Assessment & Plan Note (Signed)
Low cholesterol diet and exercise.  Follow lipid panel.   

## 2016-07-30 NOTE — Assessment & Plan Note (Signed)
Followed by oncology 

## 2016-07-30 NOTE — Assessment & Plan Note (Signed)
Has seen GI.  No significant problems with swallowing now.  Continue protonix.  Follow.  Desires no further intervention.

## 2016-07-30 NOTE — Assessment & Plan Note (Signed)
Blood pressure under good control.  Continue same medication regimen.  Follow pressures.  Follow metabolic panel.   

## 2016-08-18 IMAGING — RF DG UGI W/O KUB
11 of 18 series · 12 of 23 positions shown · non-contrast
Comparison: None.

CLINICAL DATA: Dysphagia, acid reflux

EXAM:
UPPER GI SERIES WITHOUT KUB
TECHNIQUE: Routine upper GI series was performed with thick and thin barium.
FLUOROSCOPY TIME:  Radiation Exposure Index (as provided by the
fluoroscopic device): 5.8 mGy

[Series 1: cp_standard · 0.25mm/px · 1 of 1 slices shown (1 of 11)]
[im 1/1]
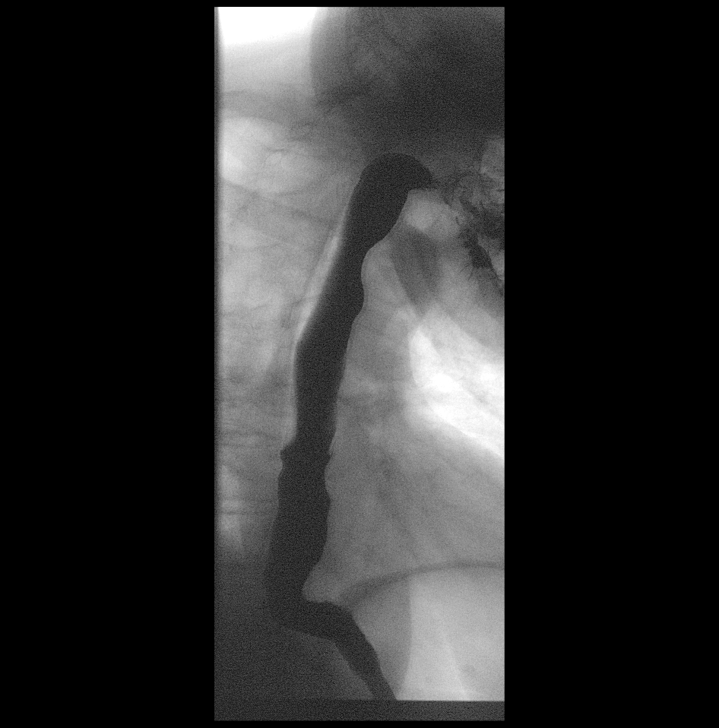

[Series 3: cp_standard · 0.25mm/px · 1 of 1 slices shown (2 of 11)]
[im 1/1]
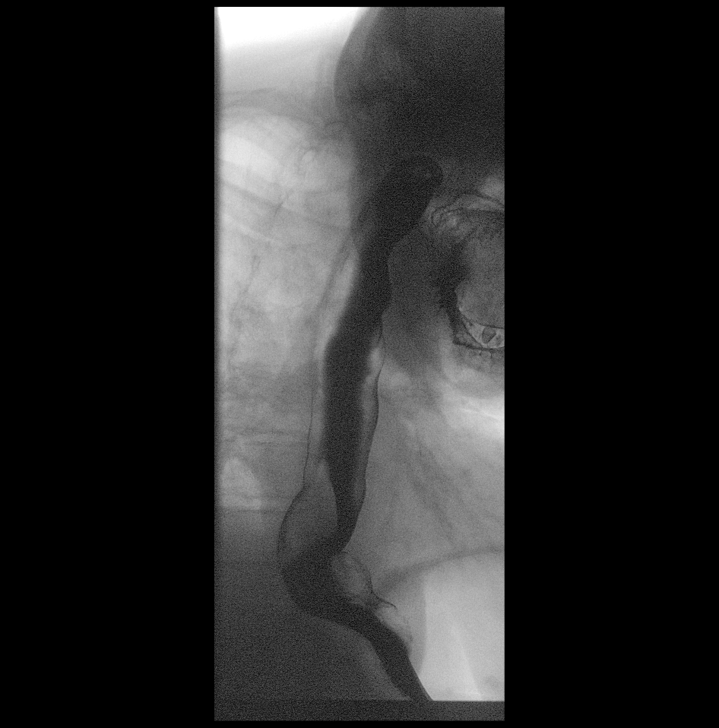

[Series 5: cp_standard · 0.25mm/px · 1 of 1 slices shown (3 of 11)]
[im 1/1]
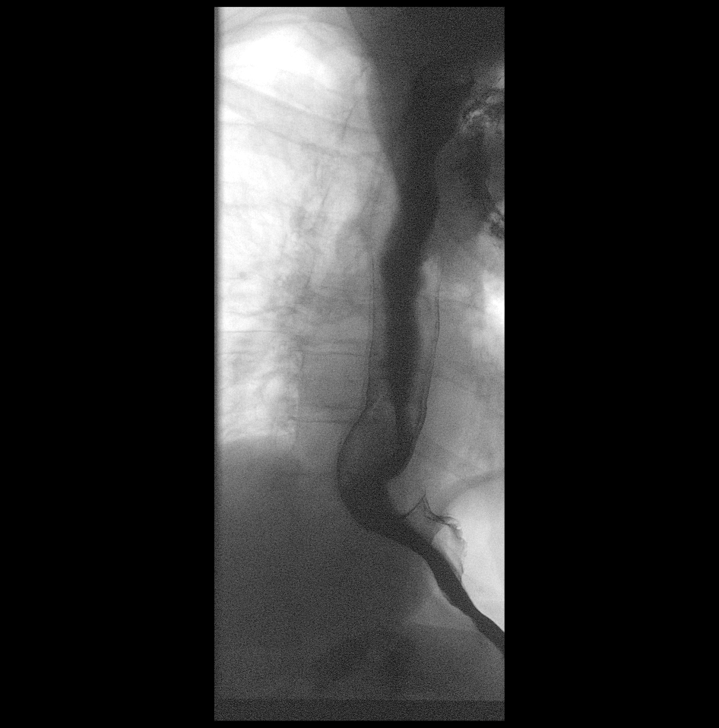

[Series 7: cp_standard · 0.28mm/px · 1 of 1 slices shown (4 of 11)]
[im 1/1]
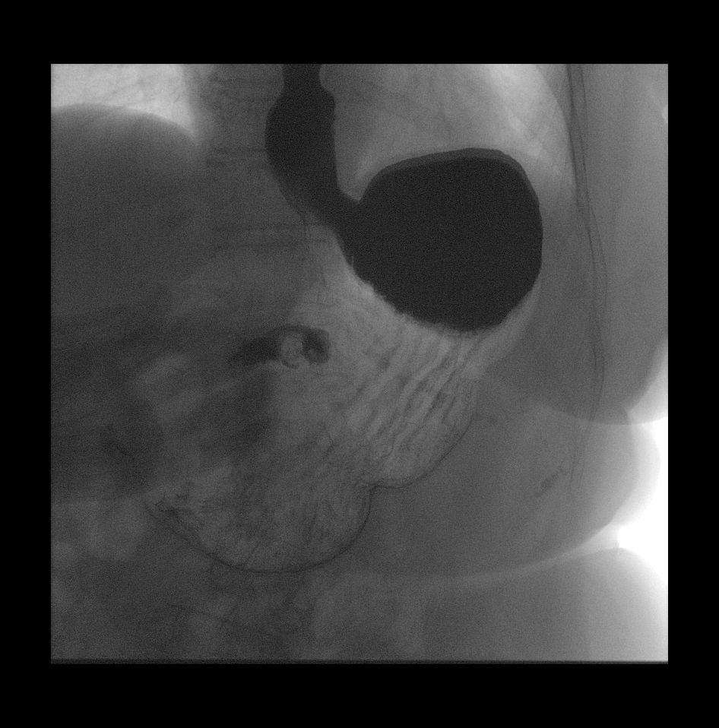

[Series 9: cp_standard · 0.28mm/px · 1 of 1 slices shown (5 of 11)]
[im 1/1]
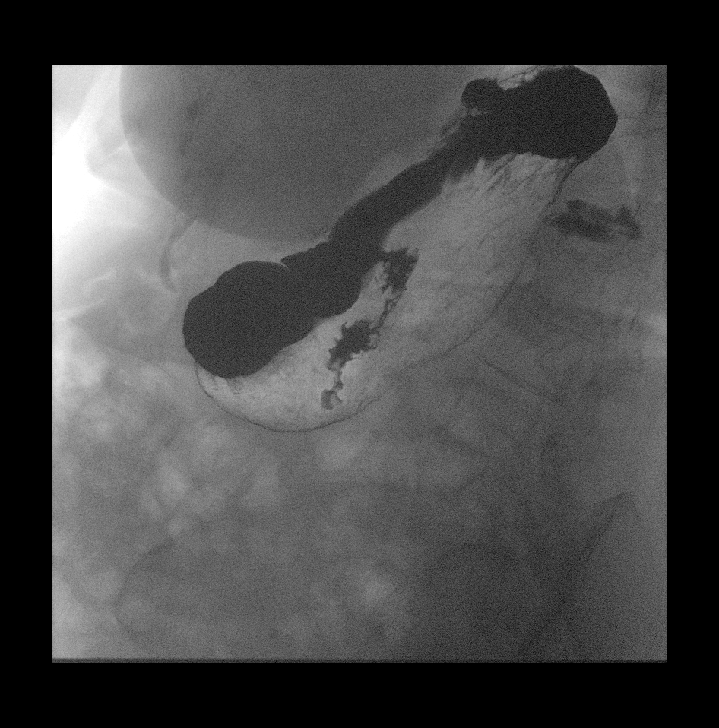

[Series 11: cp_standard · 0.28mm/px · 1 of 1 slices shown (6 of 11)]
[im 1/1]
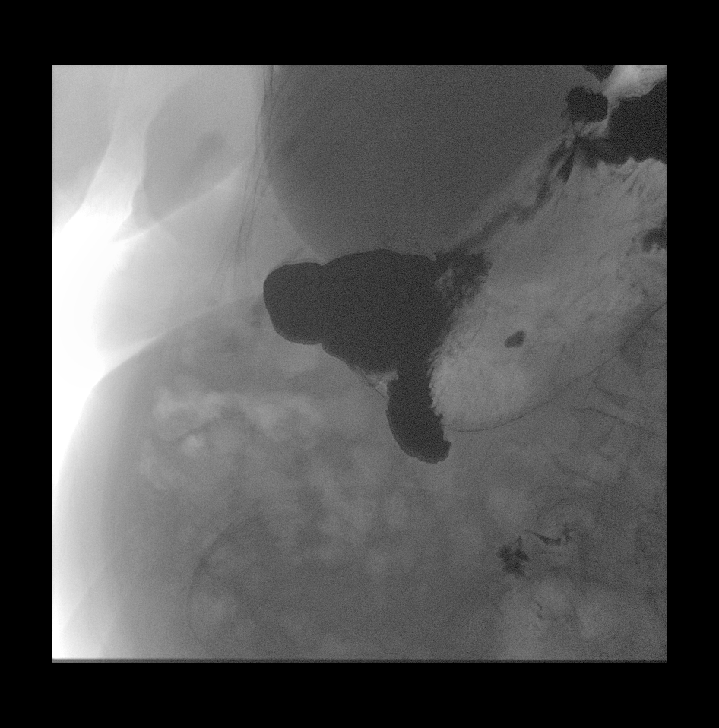

[Series 13: cp_standard · 0.28mm/px · 1 of 1 slices shown (7 of 11)]
[im 1/1]
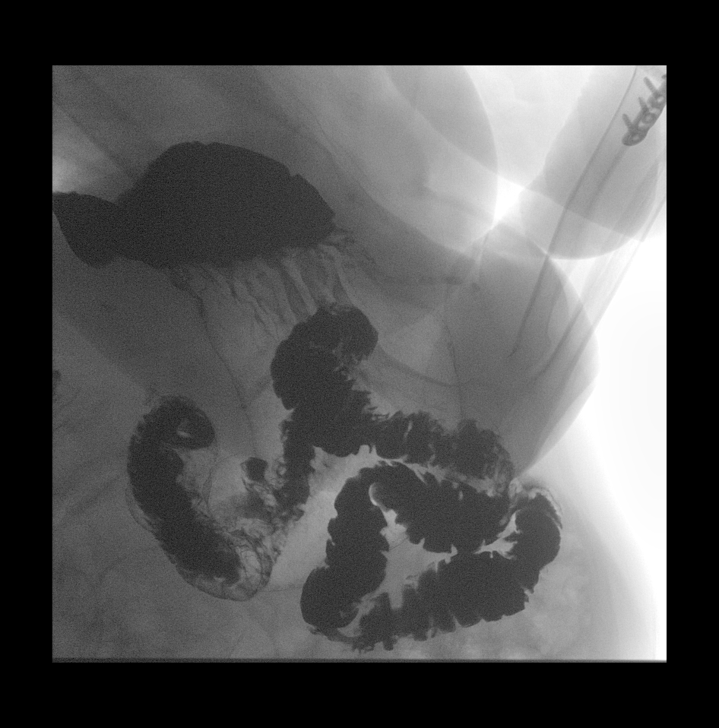

[Series 14: cp_standard · 0.55mm/px · 1 of 4 frames shown (8 of 11)]
[frame 3/4]
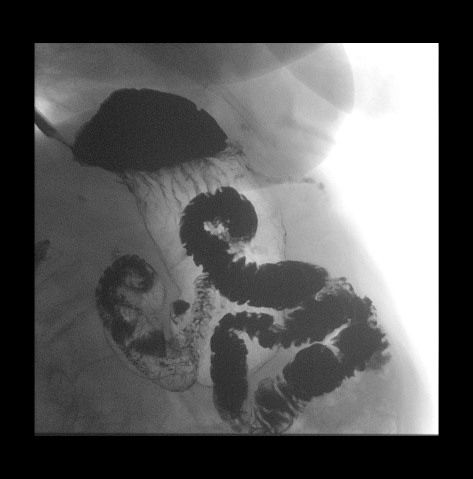

[Series 15: cp_standard · 0.28mm/px · 1 of 1 slices shown (9 of 11)]
[im 1/1]
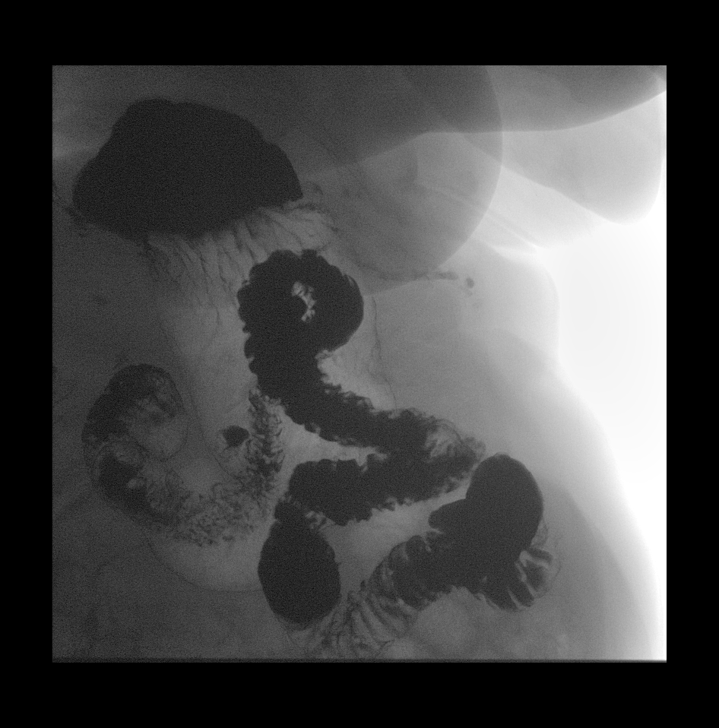

[Series 17: cp_standard · 0.50mm/px · 2 of 41 frames shown (10 of 11)]
[frame 7/41]
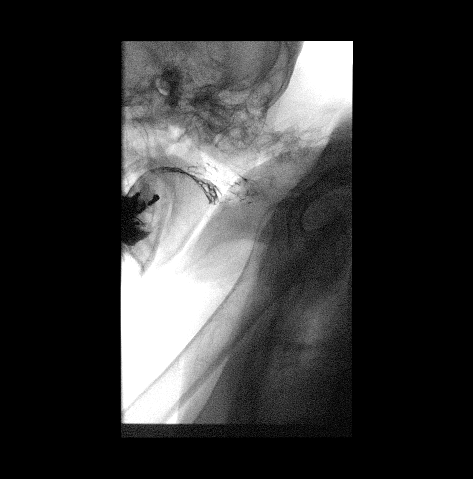
[frame 22/41]
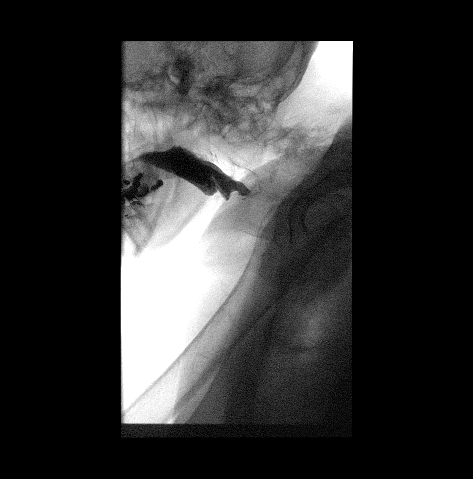

[Series 18: cp_standard · 0.25mm/px · 1 of 1 slices shown (11 of 11)]
[im 1/1]
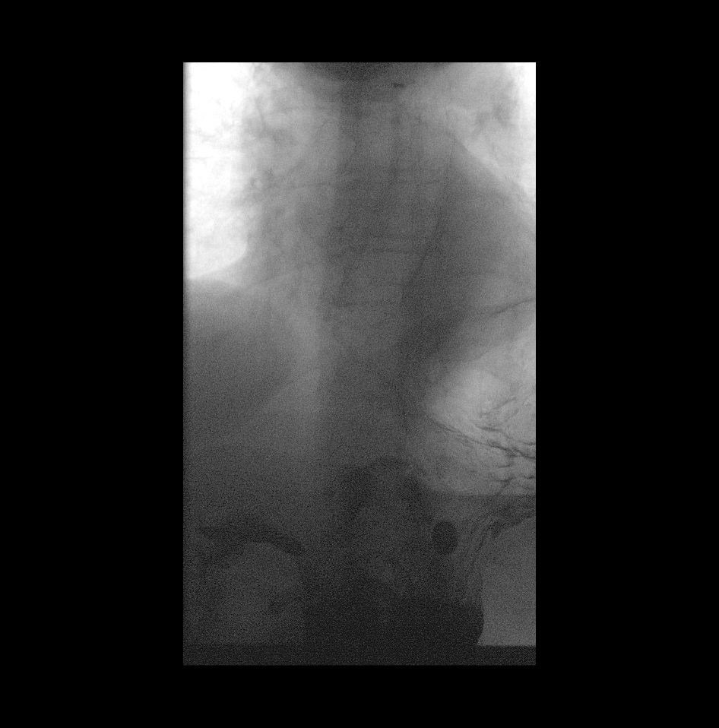

[12 of 23 positions shown; findings below may reference images not displayed]

FINDINGS: Limited evaluation secondary to difficulty in patient positioning
due to severe kyphosis and back pain.

Examination of the esophagus demonstrated normal esophageal
motility. Normal esophageal morphology without evidence of
esophagitis or ulceration. No esophageal stricture, diverticula, or
mass lesion. No evidence of hiatal hernia. There is severe
gastroesophageal reflux.

Examination of the stomach demonstrated normal rugal folds and areae
gastricae. The gastric mucosa appeared unremarkable without evidence
of ulceration, scarring, or mass lesion. Gastric motility and
emptying was normal. Fluoroscopic examination of the duodenum
demonstrates normal motility and morphology without evidence of
ulceration or mass lesion.

At the end of the examination a 13 mm barium tablet was administered
which transited through the esophagus and esophagogastric junction
without delay.
IMPRESSION: 1. Severe gastroesophageal reflux.

## 2016-10-02 ENCOUNTER — Other Ambulatory Visit: Payer: Self-pay | Admitting: Internal Medicine

## 2016-10-03 ENCOUNTER — Telehealth: Payer: Self-pay | Admitting: Internal Medicine

## 2016-10-03 NOTE — Telephone Encounter (Signed)
Notified patients daughter that she should contact the pharmacy

## 2016-10-03 NOTE — Telephone Encounter (Signed)
Pt daughter called and stated that they picked up pantoprazole (PROTONIX) 40 MG tablet they were given 3 bottles of medication with 90 tablets each, which would make a 9 month supply. Pt daughter wants to know if she could keep them or should she take them back to the pharmacy. Please advise, thank you!  Call [facility] @ [phone]

## 2016-10-30 ENCOUNTER — Other Ambulatory Visit: Payer: Self-pay | Admitting: Internal Medicine

## 2016-10-30 NOTE — Telephone Encounter (Signed)
Will fax once signed.

## 2016-10-30 NOTE — Telephone Encounter (Signed)
Last filled 07/20/2016 30 2rf

## 2016-11-14 ENCOUNTER — Telehealth: Payer: Self-pay | Admitting: Internal Medicine

## 2016-11-14 NOTE — Telephone Encounter (Signed)
Pt declined the AWV. Thank you!

## 2016-11-20 ENCOUNTER — Encounter: Payer: Self-pay | Admitting: Internal Medicine

## 2016-11-20 ENCOUNTER — Ambulatory Visit (INDEPENDENT_AMBULATORY_CARE_PROVIDER_SITE_OTHER): Payer: Medicare Other | Admitting: Internal Medicine

## 2016-11-20 VITALS — BP 160/74 | HR 68 | Temp 97.6°F | Ht 63.0 in | Wt 147.4 lb

## 2016-11-20 DIAGNOSIS — E039 Hypothyroidism, unspecified: Secondary | ICD-10-CM

## 2016-11-20 DIAGNOSIS — C8220 Follicular lymphoma grade III, unspecified, unspecified site: Secondary | ICD-10-CM

## 2016-11-20 DIAGNOSIS — R739 Hyperglycemia, unspecified: Secondary | ICD-10-CM | POA: Diagnosis not present

## 2016-11-20 DIAGNOSIS — G8929 Other chronic pain: Secondary | ICD-10-CM

## 2016-11-20 DIAGNOSIS — N2889 Other specified disorders of kidney and ureter: Secondary | ICD-10-CM | POA: Diagnosis not present

## 2016-11-20 DIAGNOSIS — K219 Gastro-esophageal reflux disease without esophagitis: Secondary | ICD-10-CM

## 2016-11-20 DIAGNOSIS — I10 Essential (primary) hypertension: Secondary | ICD-10-CM

## 2016-11-20 DIAGNOSIS — M545 Low back pain: Secondary | ICD-10-CM

## 2016-11-20 DIAGNOSIS — E7800 Pure hypercholesterolemia, unspecified: Secondary | ICD-10-CM

## 2016-11-20 MED ORDER — LISINOPRIL 10 MG PO TABS: 10.0000 mg | ORAL_TABLET | Freq: Every day | ORAL | 5 refills | Status: DC

## 2016-11-20 NOTE — Progress Notes (Signed)
Patient ID: Tasha Avila, female   DOB: October 12, 1928, 82 y.o.   MRN: [id number]   Subjective:    Patient ID: Tasha Avila, female    DOB: [birth year], 82 y.o.   MRN: [id number]  HPI  Patient here for a scheduled follow up.  She is accompanied by her daughter.  History obtained from both of them.  She reports her main concern is that of back and leg pain.  Takes tramadol.  Helps.  Discussed further evaluation and treatment options.  Discussed referral for help with pain control.  She declines.  She is eating.  No problems swallowing.  No acid reflux.  No abdominal pain or cramping.  Bowels stable.     Past Medical History:  Diagnosis Date  . Anemia   . GERD (gastroesophageal reflux disease)   . Hypercholesterolemia   . Hypertension   . Hypothyroidism    Past Surgical History:  Procedure Laterality Date  . ABDOMINAL HYSTERECTOMY  1960s   bladder tack at the same time, ovaries not removed  . LAPAROSCOPIC CHOLECYSTECTOMY  5/11   Family History  Problem Relation Age of Onset  . Lymphoma Brother   . Diabetes Brother     x2  . Thyroid disease Brother    Social History   Social History  . Marital status: Married    Spouse name: N/A  . Number of children: 2  . Years of education: N/A   Social History Main Topics  . Smoking status: Never Smoker  . Smokeless tobacco: Never Used  . Alcohol use No  . Drug use: No  . Sexual activity: Not Asked   Other Topics Concern  . None   Social History Narrative  . None    Outpatient Encounter Prescriptions as of 11/20/2016  Medication Sig  . acetaminophen (TYLENOL) 500 MG tablet Take 500 mg by mouth every 6 (six) hours as needed.  . Homeopathic Products ([employer] LEG CRAMPS PAIN RELIEF PO) Take by mouth as needed.  Marland Kitchen levothyroxine (SYNTHROID, LEVOTHROID) 75 MCG tablet Take 1 tablet (75 mcg total) by mouth daily.  Marland Kitchen lisinopril (PRINIVIL,ZESTRIL) 10 MG tablet Take 1 tablet (10 mg total) by mouth daily.  . pantoprazole (PROTONIX)  40 MG tablet TAKE ONE TABLET BY MOUTH ONCE DAILY  . traMADol (ULTRAM) 50 MG tablet TAKE ONE TABLET BY MOUTH AT BEDTIME AS NEEDED  . [DISCONTINUED] ciprofloxacin (CIPRO) 250 MG tablet Take 1 tablet (250 mg total) by mouth 2 (two) times daily.  . [DISCONTINUED] lisinopril (PRINIVIL,ZESTRIL) 10 MG tablet TAKE ONE TABLET BY MOUTH ONCE DAILY   No facility-administered encounter medications on file as of 11/20/2016.     Review of Systems  Constitutional: Negative for appetite change and unexpected weight change.  HENT: Negative for congestion and sinus pressure.   Respiratory: Negative for cough, chest tightness and shortness of breath.   Cardiovascular: Negative for chest pain, palpitations and leg swelling.  Gastrointestinal: Negative for abdominal pain, diarrhea, nausea and vomiting.  Genitourinary: Negative for difficulty urinating and dysuria.  Musculoskeletal: Positive for back pain. Negative for joint swelling.  Skin: Negative for color change and rash.  Neurological: Negative for dizziness, light-headedness and headaches.  Psychiatric/Behavioral: Negative for agitation and dysphoric mood.       Objective:     Blood pressure rechecked by me:  138-140/78  Physical Exam  Constitutional: She appears well-developed and well-nourished. No distress.  HENT:  Nose: Nose normal.  Mouth/Throat: Oropharynx is clear and moist.  Neck: Neck supple. No thyromegaly  present.  Cardiovascular: Normal rate and regular rhythm.   Pulmonary/Chest: Breath sounds normal. No respiratory distress. She has no wheezes.  Abdominal: Soft. Bowel sounds are normal. There is no tenderness.  Musculoskeletal: She exhibits no edema or tenderness.  Lymphadenopathy:    She has no cervical adenopathy.  Skin: No rash noted. No erythema.  Psychiatric: She has a normal mood and affect. Her behavior is normal.    BP (!) 160/74   Pulse 68   Temp 97.6 F (36.4 C) (Oral)   Ht '5\' 3"'  (1.6 m)   Wt 147 lb 6.4 oz (66.9  kg)   SpO2 93%   BMI 26.11 kg/m  Wt Readings from Last 3 Encounters:  11/20/16 147 lb 6.4 oz (66.9 kg)  07/20/16 145 lb (65.8 kg)  03/20/16 148 lb 2 oz (67.2 kg)     Lab Results  Component Value Date   WBC 6.9 11/24/2016   HGB 12.1 11/24/2016   HCT 36.7 11/24/2016   PLT 304.0 11/24/2016   GLUCOSE 101 (H) 11/24/2016   CHOL 210 (H) 11/24/2016   TRIG 74.0 11/24/2016   HDL 72.60 11/24/2016   LDLDIRECT 123.3 10/24/2012   LDLCALC 123 (H) 11/24/2016   ALT 8 11/24/2016   AST 13 11/24/2016   NA 142 11/24/2016   K 4.1 11/24/2016   CL 106 11/24/2016   CREATININE 0.76 11/24/2016   BUN 13 11/24/2016   CO2 30 11/24/2016   TSH 0.83 07/20/2016   HGBA1C 5.8 11/24/2016    Dg Ugi W/o Kub  Result Date: 12/02/2015 CLINICAL DATA:  Dysphagia, acid reflux EXAM: UPPER GI SERIES WITHOUT KUB TECHNIQUE: Routine upper GI series was performed with thick and thin barium. FLUOROSCOPY TIME:  Radiation Exposure Index (as provided by the fluoroscopic device): 5.8 mGy COMPARISON:  None. FINDINGS: Limited evaluation secondary to difficulty in patient positioning due to severe kyphosis and back pain. Examination of the esophagus demonstrated normal esophageal motility. Normal esophageal morphology without evidence of esophagitis or ulceration. No esophageal stricture, diverticula, or mass lesion. No evidence of hiatal hernia. There is severe gastroesophageal reflux. Examination of the stomach demonstrated normal rugal folds and areae gastricae. The gastric mucosa appeared unremarkable without evidence of ulceration, scarring, or mass lesion. Gastric motility and emptying was normal. Fluoroscopic examination of the duodenum demonstrates normal motility and morphology without evidence of ulceration or mass lesion. At the end of the examination a 13 mm barium tablet was administered which transited through the esophagus and esophagogastric junction without delay. IMPRESSION: 1. Severe gastroesophageal reflux.  Electronically Signed   By: Kathreen Devoid   On: 12/02/2015 11:57       Assessment & Plan:   Problem List Items Addressed This Visit    Back pain    Chronic.  Declines further w/up.  Has tramadol if needed.        GERD (gastroesophageal reflux disease)    On protonix.  Better.        Hypercholesterolemia    Low cholesterol diet and exercise.  Follow lipid panel.        Relevant Medications   lisinopril (PRINIVIL,ZESTRIL) 10 MG tablet   Other Relevant Orders   Hepatic function panel   Lipid panel   Hypertension    Blood pressure on recheck improved.  Have her follow pressures.  Follow met b.        Relevant Medications   lisinopril (PRINIVIL,ZESTRIL) 10 MG tablet   Other Relevant Orders   Basic metabolic panel   Hypothyroidism  On thyroid replacement.  Follow tsh.        Non-Hodgkin's lymphoma [facility])    Has been followed by oncology.  Declines further testing or evaluation.        Renal mass    Evaluated by urology and oncology.  Desires no further testing or scans.         Other Visit Diagnoses    Hyperglycemia    -  Primary   Relevant Orders   Hemoglobin A1c       Einar Pheasant, MD

## 2016-11-20 NOTE — Progress Notes (Signed)
Pre visit review using our clinic review tool, if applicable. No additional management support is needed unless otherwise documented below in the visit note. 

## 2016-11-23 ENCOUNTER — Telehealth: Payer: Self-pay

## 2016-11-23 DIAGNOSIS — E7800 Pure hypercholesterolemia, unspecified: Secondary | ICD-10-CM

## 2016-11-23 DIAGNOSIS — R739 Hyperglycemia, unspecified: Secondary | ICD-10-CM

## 2016-11-23 DIAGNOSIS — C8220 Follicular lymphoma grade III, unspecified, unspecified site: Secondary | ICD-10-CM

## 2016-11-23 DIAGNOSIS — I10 Essential (primary) hypertension: Secondary | ICD-10-CM

## 2016-11-23 NOTE — Telephone Encounter (Signed)
Orders placed for f/u labs.  

## 2016-11-23 NOTE — Telephone Encounter (Signed)
Pt coming for fasting labs 11/24/16. Please place future orders. Thank you.

## 2016-11-24 ENCOUNTER — Other Ambulatory Visit (INDEPENDENT_AMBULATORY_CARE_PROVIDER_SITE_OTHER): Payer: Medicare Other

## 2016-11-24 DIAGNOSIS — C8220 Follicular lymphoma grade III, unspecified, unspecified site: Secondary | ICD-10-CM

## 2016-11-24 DIAGNOSIS — E7800 Pure hypercholesterolemia, unspecified: Secondary | ICD-10-CM

## 2016-11-24 DIAGNOSIS — I10 Essential (primary) hypertension: Secondary | ICD-10-CM | POA: Diagnosis not present

## 2016-11-24 DIAGNOSIS — R739 Hyperglycemia, unspecified: Secondary | ICD-10-CM

## 2016-11-24 LAB — BASIC METABOLIC PANEL
BUN: 13 mg/dL (ref 6–23)
CHLORIDE: 106 meq/L (ref 96–112)
CO2: 30 meq/L (ref 19–32)
Calcium: 8.9 mg/dL (ref 8.4–10.5)
Creatinine, Ser: 0.76 mg/dL (ref 0.40–1.20)
GFR: 76.66 mL/min (ref >60.00)
Glucose, Bld: 101 mg/dL — ABNORMAL HIGH (ref 70–99)
POTASSIUM: 4.1 meq/L (ref 3.5–5.1)
Sodium: 142 mEq/L (ref 135–145)

## 2016-11-24 LAB — HEPATIC FUNCTION PANEL
ALBUMIN: 4.2 g/dL (ref 3.5–5.2)
ALT: 8 U/L (ref 0–35)
AST: 13 U/L (ref 0–37)
Alkaline Phosphatase: 39 U/L (ref 39–117)
BILIRUBIN DIRECT: 0.1 mg/dL (ref 0.0–0.3)
TOTAL PROTEIN: 6.7 g/dL (ref 6.0–8.3)
Total Bilirubin: 0.6 mg/dL (ref 0.2–1.2)

## 2016-11-24 LAB — CBC WITH DIFFERENTIAL/PLATELET
BASOS ABS: 0 10*3/uL (ref 0.0–0.1)
Basophils Relative: 0.7 % (ref 0.0–3.0)
EOS ABS: 0.1 10*3/uL (ref 0.0–0.7)
Eosinophils Relative: 2 % (ref 0.0–5.0)
HEMATOCRIT: 36.7 % (ref 36.0–46.0)
Hemoglobin: 12.1 g/dL (ref 12.0–15.0)
LYMPHS PCT: 20.7 % (ref 12.0–46.0)
Lymphs Abs: 1.4 10*3/uL (ref 0.7–4.0)
MCHC: 33.1 g/dL (ref 30.0–36.0)
MCV: 92.7 fl (ref 78.0–100.0)
MONOS PCT: 9.1 % (ref 3.0–12.0)
Monocytes Absolute: 0.6 10*3/uL (ref 0.1–1.0)
NEUTROS PCT: 67.5 % (ref 43.0–77.0)
Neutro Abs: 4.7 10*3/uL (ref 1.4–7.7)
PLATELETS: 304 10*3/uL (ref 150.0–400.0)
RBC: 3.95 Mil/uL (ref 3.87–5.11)
RDW: 14.2 % (ref 11.5–15.5)
WBC: 6.9 10*3/uL (ref 4.0–10.5)

## 2016-11-24 LAB — LIPID PANEL
CHOL/HDL RATIO: 3
CHOLESTEROL: 210 mg/dL — AB (ref 0–200)
HDL: 72.6 mg/dL (ref >39.00)
LDL Cholesterol: 123 mg/dL — ABNORMAL HIGH (ref 0–99)
NonHDL: 137.4
TRIGLYCERIDES: 74 mg/dL (ref 0.0–149.0)
VLDL: 14.8 mg/dL (ref 0.0–40.0)

## 2016-11-24 LAB — HEMOGLOBIN A1C: Hgb A1c MFr Bld: 5.8 % (ref 4.6–6.5)

## 2016-11-29 ENCOUNTER — Encounter: Payer: Self-pay | Admitting: Internal Medicine

## 2016-11-29 NOTE — Assessment & Plan Note (Signed)
Evaluated by urology and oncology.  Desires no further testing or scans.

## 2016-11-29 NOTE — Assessment & Plan Note (Signed)
Blood pressure on recheck improved.  Have her follow pressures.  Follow met b.

## 2016-11-29 NOTE — Assessment & Plan Note (Signed)
On thyroid replacement.  Follow tsh.  

## 2016-11-29 NOTE — Assessment & Plan Note (Signed)
On protonix.  Better.

## 2016-11-29 NOTE — Assessment & Plan Note (Signed)
Has been followed by oncology.  Declines further testing or evaluation.

## 2016-11-29 NOTE — Assessment & Plan Note (Signed)
Low cholesterol diet and exercise.  Follow lipid panel.   

## 2016-11-29 NOTE — Assessment & Plan Note (Signed)
Chronic.  Declines further w/up.  Has tramadol if needed.

## 2016-11-30 ENCOUNTER — Telehealth: Payer: Self-pay | Admitting: Surgical

## 2016-11-30 NOTE — Telephone Encounter (Signed)
Notified patient of lab results.  Patient verbalized understanding.  

## 2017-01-29 ENCOUNTER — Other Ambulatory Visit: Payer: Self-pay | Admitting: Internal Medicine

## 2017-01-29 NOTE — Telephone Encounter (Signed)
Refilled on 10/30/2016 with 2 refills. Last office visit 11/20/2016. Next office is 03/27/2017. Please advise.

## 2017-02-01 NOTE — Telephone Encounter (Signed)
rx has been printed and faxed.

## 2017-03-08 ENCOUNTER — Other Ambulatory Visit: Payer: Self-pay | Admitting: Internal Medicine

## 2017-03-27 ENCOUNTER — Encounter: Payer: Self-pay | Admitting: Internal Medicine

## 2017-03-27 ENCOUNTER — Ambulatory Visit (INDEPENDENT_AMBULATORY_CARE_PROVIDER_SITE_OTHER): Payer: Medicare Other | Admitting: Internal Medicine

## 2017-03-27 DIAGNOSIS — H6123 Impacted cerumen, bilateral: Secondary | ICD-10-CM | POA: Diagnosis not present

## 2017-03-27 DIAGNOSIS — R739 Hyperglycemia, unspecified: Secondary | ICD-10-CM | POA: Diagnosis not present

## 2017-03-27 DIAGNOSIS — G8929 Other chronic pain: Secondary | ICD-10-CM

## 2017-03-27 DIAGNOSIS — C8590 Non-Hodgkin lymphoma, unspecified, unspecified site: Secondary | ICD-10-CM

## 2017-03-27 DIAGNOSIS — K219 Gastro-esophageal reflux disease without esophagitis: Secondary | ICD-10-CM | POA: Diagnosis not present

## 2017-03-27 DIAGNOSIS — E7800 Pure hypercholesterolemia, unspecified: Secondary | ICD-10-CM | POA: Diagnosis not present

## 2017-03-27 DIAGNOSIS — E039 Hypothyroidism, unspecified: Secondary | ICD-10-CM

## 2017-03-27 DIAGNOSIS — I10 Essential (primary) hypertension: Secondary | ICD-10-CM

## 2017-03-27 DIAGNOSIS — M545 Low back pain: Secondary | ICD-10-CM | POA: Diagnosis not present

## 2017-03-27 LAB — LIPID PANEL
CHOLESTEROL: 208 mg/dL — AB (ref 0–200)
HDL: 71.6 mg/dL (ref >39.00)
LDL CALC: 96 mg/dL (ref 0–99)
NonHDL: 135.95
Total CHOL/HDL Ratio: 3
Triglycerides: 198 mg/dL — ABNORMAL HIGH (ref 0.0–149.0)
VLDL: 39.6 mg/dL (ref 0.0–40.0)

## 2017-03-27 LAB — HEPATIC FUNCTION PANEL
ALBUMIN: 4.1 g/dL (ref 3.5–5.2)
ALT: 9 U/L (ref 0–35)
AST: 15 U/L (ref 0–37)
Alkaline Phosphatase: 44 U/L (ref 39–117)
Bilirubin, Direct: 0.1 mg/dL (ref 0.0–0.3)
Total Bilirubin: 0.3 mg/dL (ref 0.2–1.2)
Total Protein: 6.9 g/dL (ref 6.0–8.3)

## 2017-03-27 LAB — BASIC METABOLIC PANEL
BUN: 13 mg/dL (ref 6–23)
CO2: 30 mEq/L (ref 19–32)
CREATININE: 0.89 mg/dL (ref 0.40–1.20)
Calcium: 9.1 mg/dL (ref 8.4–10.5)
Chloride: 104 mEq/L (ref 96–112)
GFR: 63.84 mL/min (ref >60.00)
Glucose, Bld: 88 mg/dL (ref 70–99)
Potassium: 4.1 mEq/L (ref 3.5–5.1)
Sodium: 139 mEq/L (ref 135–145)

## 2017-03-27 LAB — HEMOGLOBIN A1C: HEMOGLOBIN A1C: 6 % (ref 4.6–6.5)

## 2017-03-27 NOTE — Progress Notes (Signed)
Patient ID: Tasha Avila, female   DOB: 07/29/1928, 83 y.o.   MRN: [id number]   Subjective:    Patient ID: Tasha Avila, female    DOB: 05-03-1928, 83 y.o.   MRN: [id number]  HPI  Patient here for a scheduled follow up.  She is accompanied by her daughter.  History obtained from both of them.  She is still having issues with back and leg pain.  Discussed with her today.  She is taking tramadol.  Can add tylenol.  Discussed referral to pain clinic or to Dr Sharlet Salina for better pain control.  She declines.  No abdominal pain.  She is eating.  No nausea or vomiting.     Past Medical History:  Diagnosis Date  . Anemia   . GERD (gastroesophageal reflux disease)   . Hypercholesterolemia   . Hypertension   . Hypothyroidism    Past Surgical History:  Procedure Laterality Date  . ABDOMINAL HYSTERECTOMY  1960s   bladder tack at the same time, ovaries not removed  . LAPAROSCOPIC CHOLECYSTECTOMY  5/11   Family History  Problem Relation Age of Onset  . Lymphoma Brother   . Diabetes Brother     x2  . Thyroid disease Brother    Social History   Social History  . Marital status: Married    Spouse name: N/A  . Number of children: 2  . Years of education: N/A   Social History Main Topics  . Smoking status: Never Smoker  . Smokeless tobacco: Never Used  . Alcohol use No  . Drug use: No  . Sexual activity: Not Asked   Other Topics Concern  . None   Social History Narrative  . None    Outpatient Encounter Prescriptions as of 03/27/2017  Medication Sig  . acetaminophen (TYLENOL) 500 MG tablet Take 500 mg by mouth every 6 (six) hours as needed.  . Homeopathic Products ([employer] LEG CRAMPS PAIN RELIEF PO) Take by mouth as needed.  Marland Kitchen levothyroxine (SYNTHROID, LEVOTHROID) 75 MCG tablet Take 1 tablet (75 mcg total) by mouth daily.  Marland Kitchen lisinopril (PRINIVIL,ZESTRIL) 10 MG tablet Take 1 tablet (10 mg total) by mouth daily.  . pantoprazole (PROTONIX) 40 MG tablet TAKE ONE TABLET BY  MOUTH ONCE DAILY  . traMADol (ULTRAM) 50 MG tablet TAKE ONE TABLET BY MOUTH AT BEDTIME AS NEEDED   No facility-administered encounter medications on file as of 03/27/2017.     Review of Systems  Constitutional: Negative for appetite change and unexpected weight change.  HENT: Negative for congestion and sinus pressure.   Respiratory: Negative for cough, chest tightness and shortness of breath.   Cardiovascular: Negative for chest pain, palpitations and leg swelling.  Gastrointestinal: Negative for abdominal pain, diarrhea, nausea and vomiting.  Genitourinary: Negative for difficulty urinating and dysuria.  Musculoskeletal: Positive for back pain. Negative for joint swelling.  Skin: Negative for color change and rash.  Neurological: Negative for dizziness, light-headedness and headaches.  Psychiatric/Behavioral: Negative for agitation and dysphoric mood.       Objective:     Blood pressure rechecked by me:  138/78  Physical Exam  HENT:  Nose: Nose normal.  Mouth/Throat: Oropharynx is clear and moist.  Neck: Neck supple. No thyromegaly present.  Cardiovascular: Normal rate and regular rhythm.   Pulmonary/Chest: Breath sounds normal. No respiratory distress. She has no wheezes.  Abdominal: Soft. Bowel sounds are normal. There is no tenderness.  Musculoskeletal: She exhibits no edema or tenderness.  Lymphadenopathy:    She  has no cervical adenopathy.  Skin: No rash noted. No erythema.  Psychiatric: She has a normal mood and affect. Her behavior is normal.    BP 138/78   Pulse 68   Temp 98.6 F (37 C) (Oral)   Resp 12   Ht 5\' 3"  (1.6 m)   Wt 147 lb 6.4 oz (66.9 kg)   SpO2 95%   BMI 26.11 kg/m  Wt Readings from Last 3 Encounters:  03/27/17 147 lb 6.4 oz (66.9 kg)  11/20/16 147 lb 6.4 oz (66.9 kg)  07/20/16 145 lb (65.8 kg)     Lab Results  Component Value Date   WBC 6.9 11/24/2016   HGB 12.1 11/24/2016   HCT 36.7 11/24/2016   PLT 304.0 11/24/2016   GLUCOSE 88  03/27/2017   CHOL 208 (H) 03/27/2017   TRIG 198.0 (H) 03/27/2017   HDL 71.60 03/27/2017   LDLDIRECT 123.3 10/24/2012   LDLCALC 96 03/27/2017   ALT 9 03/27/2017   AST 15 03/27/2017   NA 139 03/27/2017   K 4.1 03/27/2017   CL 104 03/27/2017   CREATININE 0.89 03/27/2017   BUN 13 03/27/2017   CO2 30 03/27/2017   TSH 0.83 07/20/2016   HGBA1C 6.0 03/27/2017       Assessment & Plan:   Problem List Items Addressed This Visit    Back pain    Persistent.  Has tramadol.  Instructed could take tylenol with this.  Discussed repeat xray.  Discussed referral for help with pain control.  She declines.        GERD (gastroesophageal reflux disease)    On protonix.  Desires no further intervention.        Hypercholesterolemia    Low cholesterol diet and exercise.  Follow lipid panel.        Hypertension    Blood pressure under good control.  Continue same medication regimen.  Follow pressures.  Follow metabolic panel.        Hypothyroidism    On thyroid replacement.  Follow tsh.        Impacted cerumen    Bilateral cerumen impaction.  Ears irrigated today.  Follow.        Lymphoma ([place]) (Chronic)    Was followed by Dr Oliva Bustard.  She has desired no further w/up or testing.         Other Visit Diagnoses    Hyperglycemia           Einar Pheasant, MD

## 2017-03-27 NOTE — Progress Notes (Signed)
Pre-visit discussion using our clinic review tool. No additional management support is needed unless otherwise documented below in the visit note.  

## 2017-04-02 ENCOUNTER — Encounter: Payer: Self-pay | Admitting: Internal Medicine

## 2017-04-02 NOTE — Assessment & Plan Note (Signed)
On protonix.  Desires no further intervention.

## 2017-04-02 NOTE — Assessment & Plan Note (Signed)
On thyroid replacement.  Follow tsh.  

## 2017-04-02 NOTE — Assessment & Plan Note (Signed)
Blood pressure under good control.  Continue same medication regimen.  Follow pressures.  Follow metabolic panel.   

## 2017-04-02 NOTE — Assessment & Plan Note (Signed)
Low cholesterol diet and exercise.  Follow lipid panel.   

## 2017-04-02 NOTE — Assessment & Plan Note (Signed)
Persistent.  Has tramadol.  Instructed could take tylenol with this.  Discussed repeat xray.  Discussed referral for help with pain control.  She declines.

## 2017-04-02 NOTE — Assessment & Plan Note (Signed)
Was followed by Dr Oliva Bustard.  She has desired no further w/up or testing.

## 2017-04-02 NOTE — Assessment & Plan Note (Signed)
Bilateral cerumen impaction.  Ears irrigated today.  Follow.

## 2017-05-23 ENCOUNTER — Other Ambulatory Visit: Payer: Self-pay | Admitting: Internal Medicine

## 2017-06-07 ENCOUNTER — Other Ambulatory Visit: Payer: Self-pay | Admitting: Internal Medicine

## 2017-06-08 NOTE — Telephone Encounter (Signed)
Faxed

## 2017-06-08 NOTE — Telephone Encounter (Signed)
Last OV was 03/27/2017, upcoming appt on 08/07/17, last refill for tramadol was 01/30/17 #30 with 2 refills.  Please advise, thanks

## 2017-07-12 ENCOUNTER — Other Ambulatory Visit: Payer: Self-pay | Admitting: Internal Medicine

## 2017-07-30 DIAGNOSIS — H01003 Unspecified blepharitis right eye, unspecified eyelid: Secondary | ICD-10-CM | POA: Diagnosis not present

## 2017-08-07 ENCOUNTER — Encounter: Payer: Self-pay | Admitting: Internal Medicine

## 2017-08-07 ENCOUNTER — Ambulatory Visit (INDEPENDENT_AMBULATORY_CARE_PROVIDER_SITE_OTHER): Payer: Medicare Other | Admitting: Internal Medicine

## 2017-08-07 VITALS — BP 148/78 | HR 64 | Temp 97.5°F | Ht 62.99 in | Wt 144.2 lb

## 2017-08-07 DIAGNOSIS — I10 Essential (primary) hypertension: Secondary | ICD-10-CM

## 2017-08-07 DIAGNOSIS — E039 Hypothyroidism, unspecified: Secondary | ICD-10-CM

## 2017-08-07 DIAGNOSIS — N2889 Other specified disorders of kidney and ureter: Secondary | ICD-10-CM

## 2017-08-07 DIAGNOSIS — C8220 Follicular lymphoma grade III, unspecified, unspecified site: Secondary | ICD-10-CM | POA: Diagnosis not present

## 2017-08-07 DIAGNOSIS — Z0000 Encounter for general adult medical examination without abnormal findings: Secondary | ICD-10-CM | POA: Diagnosis not present

## 2017-08-07 DIAGNOSIS — M545 Low back pain, unspecified: Secondary | ICD-10-CM

## 2017-08-07 DIAGNOSIS — G8929 Other chronic pain: Secondary | ICD-10-CM

## 2017-08-07 DIAGNOSIS — R1310 Dysphagia, unspecified: Secondary | ICD-10-CM | POA: Diagnosis not present

## 2017-08-07 DIAGNOSIS — R739 Hyperglycemia, unspecified: Secondary | ICD-10-CM

## 2017-08-07 DIAGNOSIS — R350 Frequency of micturition: Secondary | ICD-10-CM

## 2017-08-07 DIAGNOSIS — K219 Gastro-esophageal reflux disease without esophagitis: Secondary | ICD-10-CM | POA: Diagnosis not present

## 2017-08-07 DIAGNOSIS — E7800 Pure hypercholesterolemia, unspecified: Secondary | ICD-10-CM | POA: Diagnosis not present

## 2017-08-07 LAB — BASIC METABOLIC PANEL
BUN: 15 mg/dL (ref 6–23)
CALCIUM: 9.1 mg/dL (ref 8.4–10.5)
CO2: 25 mEq/L (ref 19–32)
Chloride: 106 mEq/L (ref 96–112)
Creatinine, Ser: 0.74 mg/dL (ref 0.40–1.20)
GFR: 78.93 mL/min (ref >60.00)
GLUCOSE: 120 mg/dL — AB (ref 70–99)
POTASSIUM: 4.4 meq/L (ref 3.5–5.1)
SODIUM: 140 meq/L (ref 135–145)

## 2017-08-07 LAB — LIPID PANEL
CHOLESTEROL: 190 mg/dL (ref 0–200)
HDL: 66.3 mg/dL (ref >39.00)
LDL Cholesterol: 102 mg/dL — ABNORMAL HIGH (ref 0–99)
NonHDL: 123.9
Total CHOL/HDL Ratio: 3
Triglycerides: 112 mg/dL (ref 0.0–149.0)
VLDL: 22.4 mg/dL (ref 0.0–40.0)

## 2017-08-07 LAB — HEPATIC FUNCTION PANEL
ALBUMIN: 4 g/dL (ref 3.5–5.2)
ALT: 9 U/L (ref 0–35)
AST: 15 U/L (ref 0–37)
Alkaline Phosphatase: 45 U/L (ref 39–117)
BILIRUBIN TOTAL: 0.3 mg/dL (ref 0.2–1.2)
Bilirubin, Direct: 0 mg/dL (ref 0.0–0.3)
TOTAL PROTEIN: 6.8 g/dL (ref 6.0–8.3)

## 2017-08-07 LAB — URINALYSIS, ROUTINE W REFLEX MICROSCOPIC
BILIRUBIN URINE: NEGATIVE
Hgb urine dipstick: NEGATIVE
Ketones, ur: NEGATIVE
LEUKOCYTES UA: NEGATIVE
NITRITE: NEGATIVE
PH: 6.5 (ref 5.0–8.0)
SPECIFIC GRAVITY, URINE: 1.02 (ref 1.000–1.030)
Total Protein, Urine: NEGATIVE
UROBILINOGEN UA: 0.2 (ref 0.0–1.0)
Urine Glucose: NEGATIVE

## 2017-08-07 LAB — HEMOGLOBIN A1C: HEMOGLOBIN A1C: 5.9 % (ref 4.6–6.5)

## 2017-08-07 LAB — TSH: TSH: 0.67 u[IU]/mL (ref 0.35–4.50)

## 2017-08-07 NOTE — Progress Notes (Signed)
Patient ID: Tasha Avila, female   DOB: 07/24/1928, 83 y.o.   MRN: [id number]   Subjective:    Patient ID: Tasha Avila, female    DOB: Aug 02, 1928, 83 y.o.   MRN: [id number]  HPI  Patient with past history of hypercholesterolemia, hypertension and GERD.  She comes in today to follow up on these issues as well as for a complete physical exam.  She is accompanied by her daughter.  History obtained from both of them.  She reports persistent increased pain in her back, etc.  Discussed further evaluation and w/up and treatment. She declines.  Takes tramadol.  Takes prn.  Discussed not waiting until pain is so bad - to take medication.  No chest pain.  Breathing stable.  Still some issues with acid reflux.  Takes protonix.  Does help.  Discussed further evaluation.  She declines.  Bowels moving.  She notices some burning with urination.  Concern might have urinary tract infection.     Past Medical History:  Diagnosis Date  . Anemia   . GERD (gastroesophageal reflux disease)   . Hypercholesterolemia   . Hypertension   . Hypothyroidism    Past Surgical History:  Procedure Laterality Date  . ABDOMINAL HYSTERECTOMY  1960s   bladder tack at the same time, ovaries not removed  . LAPAROSCOPIC CHOLECYSTECTOMY  5/11   Family History  Problem Relation Age of Onset  . Lymphoma Brother   . Diabetes Brother        x2  . Thyroid disease Brother    Social History   Social History  . Marital status: Married    Spouse name: N/A  . Number of children: 2  . Years of education: N/A   Social History Main Topics  . Smoking status: Never Smoker  . Smokeless tobacco: Never Used  . Alcohol use No  . Drug use: No  . Sexual activity: Not Asked   Other Topics Concern  . None   Social History Narrative  . None    Outpatient Encounter Prescriptions as of 08/07/2017  Medication Sig  . acetaminophen (TYLENOL) 500 MG tablet Take 500 mg by mouth every 6 (six) hours as needed.  .  Homeopathic Products ([employer] LEG CRAMPS PAIN RELIEF PO) Take by mouth as needed.  Marland Kitchen levothyroxine (SYNTHROID, LEVOTHROID) 75 MCG tablet TAKE ONE TABLET BY MOUTH ONCE DAILY  . lisinopril (PRINIVIL,ZESTRIL) 10 MG tablet TAKE ONE TABLET BY MOUTH ONCE DAILY  . neomycin-polymyxin-dexameth (MAXITROL) 0.1 % OINT Place 1 application into both eyes 3 (three) times daily.  . pantoprazole (PROTONIX) 40 MG tablet TAKE 1 TABLET BY MOUTH ONCE DAILY  . traMADol (ULTRAM) 50 MG tablet TAKE 1 TABLET BY MOUTH AT BEDTIME AS NEEDED   No facility-administered encounter medications on file as of 08/07/2017.     Review of Systems  Constitutional: Negative for appetite change and unexpected weight change.  HENT: Negative for congestion and sinus pressure.   Eyes: Negative for pain and visual disturbance.  Respiratory: Negative for cough, chest tightness and shortness of breath.   Cardiovascular: Negative for chest pain, palpitations and leg swelling.  Gastrointestinal: Negative for abdominal pain, diarrhea, nausea and vomiting.  Genitourinary: Positive for dysuria. Negative for difficulty urinating.  Musculoskeletal: Negative for joint swelling.       Back pain as outlined.    Skin: Negative for color change and rash.  Neurological: Negative for dizziness, light-headedness and headaches.  Hematological: Negative for adenopathy. Does not bruise/bleed easily.  Psychiatric/Behavioral: Negative  for agitation and dysphoric mood.       Objective:    Physical Exam  Constitutional: She is oriented to person, place, and time. She appears well-developed and well-nourished. No distress.  HENT:  Nose: Nose normal.  Mouth/Throat: Oropharynx is clear and moist.  Eyes: Right eye exhibits no discharge. Left eye exhibits no discharge. No scleral icterus.  Neck: Neck supple. No thyromegaly present.  Cardiovascular: Normal rate and regular rhythm.   Pulmonary/Chest: Breath sounds normal. No accessory muscle usage. No tachypnea.  No respiratory distress. She has no decreased breath sounds. She has no wheezes. She has no rhonchi. Right breast exhibits no inverted nipple, no mass, no nipple discharge and no tenderness (no axillary adenopathy). Left breast exhibits no inverted nipple, no mass, no nipple discharge and no tenderness (no axilarry adenopathy).  Abdominal: Soft. Bowel sounds are normal. There is no tenderness.  Musculoskeletal: She exhibits no edema or tenderness.  Lymphadenopathy:    She has no cervical adenopathy.  Neurological: She is alert and oriented to person, place, and time.  Skin: Skin is warm. No rash noted. No erythema.  Psychiatric: She has a normal mood and affect. Her behavior is normal.    BP (!) 148/78 (BP Location: Left Arm, Patient Position: Sitting, Cuff Size: Normal)   Pulse 64   Temp (!) 97.5 F (36.4 C) (Oral)   Ht 5' 2.99" (1.6 m)   Wt 144 lb 3.2 oz (65.4 kg)   SpO2 97%   BMI 25.55 kg/m  Wt Readings from Last 3 Encounters:  08/07/17 144 lb 3.2 oz (65.4 kg)  03/27/17 147 lb 6.4 oz (66.9 kg)  11/20/16 147 lb 6.4 oz (66.9 kg)     Lab Results  Component Value Date   WBC 6.9 11/24/2016   HGB 12.1 11/24/2016   HCT 36.7 11/24/2016   PLT 304.0 11/24/2016   GLUCOSE 120 (H) 08/07/2017   CHOL 190 08/07/2017   TRIG 112.0 08/07/2017   HDL 66.30 08/07/2017   LDLDIRECT 123.3 10/24/2012   LDLCALC 102 (H) 08/07/2017   ALT 9 08/07/2017   AST 15 08/07/2017   NA 140 08/07/2017   K 4.4 08/07/2017   CL 106 08/07/2017   CREATININE 0.74 08/07/2017   BUN 15 08/07/2017   CO2 25 08/07/2017   TSH 0.67 08/07/2017   HGBA1C 5.9 08/07/2017    Dg Ugi W/o Kub  Result Date: 12/02/2015 CLINICAL DATA:  Dysphagia, acid reflux EXAM: UPPER GI SERIES WITHOUT KUB TECHNIQUE: Routine upper GI series was performed with thick and thin barium. FLUOROSCOPY TIME:  Radiation Exposure Index (as provided by the fluoroscopic device): 5.8 mGy COMPARISON:  None. FINDINGS: Limited evaluation secondary to  difficulty in patient positioning due to severe kyphosis and back pain. Examination of the esophagus demonstrated normal esophageal motility. Normal esophageal morphology without evidence of esophagitis or ulceration. No esophageal stricture, diverticula, or mass lesion. No evidence of hiatal hernia. There is severe gastroesophageal reflux. Examination of the stomach demonstrated normal rugal folds and areae gastricae. The gastric mucosa appeared unremarkable without evidence of ulceration, scarring, or mass lesion. Gastric motility and emptying was normal. Fluoroscopic examination of the duodenum demonstrates normal motility and morphology without evidence of ulceration or mass lesion. At the end of the examination a 13 mm barium tablet was administered which transited through the esophagus and esophagogastric junction without delay. IMPRESSION: 1. Severe gastroesophageal reflux. Electronically Signed   By: Kathreen Devoid   On: 12/02/2015 11:57       Assessment &  Plan:   Problem List Items Addressed This Visit    Back pain    Persistent.  Has tramadol as needed.  Desires no further treatment, evaluation or testing.        Dysphagia    Discussed with her today.  Declines further w/up or evaluation.        GERD (gastroesophageal reflux disease)    On protonix.  Discussed further w/up.  Declines further evaluation.        Health care maintenance    Physical today 08/07/17.  Declines mammogram and bone density.        Hypercholesterolemia - Primary    Low cholesterol diet and exercise.  Follow lipid panel.        Relevant Orders   Lipid panel (Completed)   Hepatic function panel (Completed)   Hyperglycemia    Low carb diet and exercise.  Follow met b and a1c.       Relevant Orders   Hemoglobin A1c (Completed)   Hypertension    Blood pressure elevated today on my check.  Discussed with her today.  Continue same medication regimen.  Follow pressures.  Follow metabolic panel.         Relevant Orders   Basic metabolic panel (Completed)   Hypothyroidism    On thyroid replacement.  Follow tsh.       Relevant Orders   TSH (Completed)   Non-Hodgkin's lymphoma ([place])    Has been evaluated by oncology.  Declines further scanning or testing.       Pure hypercholesterolemia    Follow lipid panel.       Renal mass    Evaluated by urology and oncology.  Discussed with her today. Desires no further testing or scanning.        Urinary frequency    Some increased frequency and some burning at times.  Check urinalysis to confirm no infection.        Relevant Orders   Urinalysis, Routine w reflex microscopic (Completed)   Urine Culture (Completed)       Einar Pheasant, MD

## 2017-08-07 NOTE — Assessment & Plan Note (Addendum)
Blood pressure elevated today on my check.  Discussed with her today.  Continue same medication regimen.  Follow pressures.  Follow metabolic panel.

## 2017-08-07 NOTE — Assessment & Plan Note (Signed)
Physical today 08/07/17.  Declines mammogram and bone density.

## 2017-08-07 NOTE — Assessment & Plan Note (Signed)
Has been evaluated by oncology.  Declines further scanning or testing.

## 2017-08-07 NOTE — Assessment & Plan Note (Signed)
On thyroid replacement.  Follow tsh.  

## 2017-08-08 LAB — URINE CULTURE

## 2017-08-10 ENCOUNTER — Encounter: Payer: Self-pay | Admitting: Internal Medicine

## 2017-08-10 NOTE — Assessment & Plan Note (Signed)
Low carb diet and exercise.  Follow met b and a1c.  

## 2017-08-10 NOTE — Assessment & Plan Note (Signed)
Follow lipid panel.   

## 2017-08-10 NOTE — Assessment & Plan Note (Signed)
Persistent.  Has tramadol as needed.  Desires no further treatment, evaluation or testing.

## 2017-08-10 NOTE — Assessment & Plan Note (Signed)
Discussed with her today.  Declines further w/up or evaluation.

## 2017-08-10 NOTE — Assessment & Plan Note (Signed)
On protonix.  Discussed further w/up.  Declines further evaluation.

## 2017-08-10 NOTE — Assessment & Plan Note (Signed)
Evaluated by urology and oncology.  Discussed with her today. Desires no further testing or scanning.

## 2017-08-10 NOTE — Assessment & Plan Note (Signed)
Low cholesterol diet and exercise.  Follow lipid panel.   

## 2017-08-10 NOTE — Assessment & Plan Note (Signed)
Some increased frequency and some burning at times.  Check urinalysis to confirm no infection.

## 2017-08-13 ENCOUNTER — Telehealth: Payer: Self-pay | Admitting: Internal Medicine
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
## 2017-08-13 NOTE — Telephone Encounter (Signed)
Thank you.  She would need formal driving evaluation.

## 2017-08-13 NOTE — Telephone Encounter (Signed)
Will continue to try and reach patient first attempt no voicemail.

## 2017-08-13 NOTE — Telephone Encounter (Signed)
She would need to get a formal driving evaluation to be able to be cleared to drive.

## 2017-08-13 NOTE — Telephone Encounter (Signed)
From what I understand that is what the hearing at [facility] is for, the form required nothing from PCP she had , patient was wanting PCP to write a letter stating it was ok to drive per her health record or that PCP felt she was medically clear to drive.

## 2017-08-14 NOTE — Telephone Encounter (Signed)
Patient does not want to take the driving review.

## 2017-08-23 ENCOUNTER — Other Ambulatory Visit: Payer: Self-pay | Admitting: Internal Medicine

## 2017-08-24 NOTE — Telephone Encounter (Signed)
Last filled on 06/08/17 for #30 and 2 refills.  Last OV: 08/07/17 Next OV: 12/18/17

## 2017-08-28 ENCOUNTER — Other Ambulatory Visit: Payer: Self-pay | Admitting: Internal Medicine

## 2017-11-18 ENCOUNTER — Other Ambulatory Visit: Payer: Self-pay | Admitting: Internal Medicine

## 2017-11-23 ENCOUNTER — Other Ambulatory Visit: Payer: Self-pay | Admitting: Internal Medicine

## 2017-11-28 ENCOUNTER — Other Ambulatory Visit: Payer: Self-pay | Admitting: Internal Medicine

## 2017-12-18 ENCOUNTER — Ambulatory Visit (INDEPENDENT_AMBULATORY_CARE_PROVIDER_SITE_OTHER): Payer: Medicare Other | Admitting: Internal Medicine

## 2017-12-18 ENCOUNTER — Encounter: Payer: Self-pay | Admitting: Internal Medicine

## 2017-12-18 VITALS — BP 170/70 | HR 62 | Temp 98.0°F | Resp 16 | Wt 143.8 lb

## 2017-12-18 DIAGNOSIS — I10 Essential (primary) hypertension: Secondary | ICD-10-CM | POA: Diagnosis not present

## 2017-12-18 DIAGNOSIS — R739 Hyperglycemia, unspecified: Secondary | ICD-10-CM

## 2017-12-18 DIAGNOSIS — M545 Low back pain, unspecified: Secondary | ICD-10-CM

## 2017-12-18 DIAGNOSIS — C8590 Non-Hodgkin lymphoma, unspecified, unspecified site: Secondary | ICD-10-CM | POA: Diagnosis not present

## 2017-12-18 DIAGNOSIS — N2889 Other specified disorders of kidney and ureter: Secondary | ICD-10-CM | POA: Diagnosis not present

## 2017-12-18 DIAGNOSIS — R079 Chest pain, unspecified: Secondary | ICD-10-CM

## 2017-12-18 DIAGNOSIS — R350 Frequency of micturition: Secondary | ICD-10-CM

## 2017-12-18 DIAGNOSIS — E7800 Pure hypercholesterolemia, unspecified: Secondary | ICD-10-CM

## 2017-12-18 DIAGNOSIS — E039 Hypothyroidism, unspecified: Secondary | ICD-10-CM

## 2017-12-18 DIAGNOSIS — G8929 Other chronic pain: Secondary | ICD-10-CM | POA: Diagnosis not present

## 2017-12-18 DIAGNOSIS — K219 Gastro-esophageal reflux disease without esophagitis: Secondary | ICD-10-CM

## 2017-12-18 MED ORDER — IPRATROPIUM BROMIDE 0.03 % NA SOLN: 2.0000 | Freq: Two times a day (BID) | NASAL | 1 refills | Status: DC

## 2017-12-18 MED ORDER — LISINOPRIL 20 MG PO TABS: 20.0000 mg | ORAL_TABLET | Freq: Every day | ORAL | 3 refills | Status: DC

## 2017-12-18 NOTE — Progress Notes (Signed)
Patient ID: Tasha Avila, female   DOB: 1928/03/12, 84 y.o.   MRN: [id number]   Subjective:    Patient ID: Tasha Avila, female    DOB: Oct 22, 1928, 84 y.o.   MRN: [id number]  HPI  Patient here for a scheduled follow up.  She is accompanied by her daughter.  History obtained from both of them.  She reports persistent back and joint/leg pain.  Discussed with her today.  She declines further evaluation or testing.  Discussed taking her pain medications on a more regular basis to help with pain control.  She is eating.  No nausea or vomiting.  No increased acid reflux reported.  On PPI.  Noticed one episode of chest discomfort.  Lasted only for a brief period.  Resolved with no intervention.  Reports no chest pain since.  No chest pain with increased activity.  No abdominal pain.  Bowels moving.  Blood pressure elevated.     Past Medical History:  Diagnosis Date  . Anemia   . GERD (gastroesophageal reflux disease)   . Hypercholesterolemia   . Hypertension   . Hypothyroidism    Past Surgical History:  Procedure Laterality Date  . ABDOMINAL HYSTERECTOMY  1960s   bladder tack at the same time, ovaries not removed  . LAPAROSCOPIC CHOLECYSTECTOMY  5/11   Family History  Problem Relation Age of Onset  . Lymphoma Brother   . Diabetes Brother        x2  . Thyroid disease Brother    Social History   Socioeconomic History  . Marital status: Married    Spouse name: None  . Number of children: 2  . Years of education: None  . Highest education level: None  Social Needs  . Financial resource strain: None  . Food insecurity - worry: None  . Food insecurity - inability: None  . Transportation needs - medical: None  . Transportation needs - non-medical: None  Occupational History  . None  Tobacco Use  . Smoking status: Never Smoker  . Smokeless tobacco: Never Used  Substance and Sexual Activity  . Alcohol use: No    Alcohol/week: 0.0 oz  . Drug use: No  . Sexual  activity: None  Other Topics Concern  . None  Social History Narrative  . None    Outpatient Encounter Medications as of 12/18/2017  Medication Sig  . meloxicam (MOBIC) 7.5 MG tablet Take by mouth.  Marland Kitchen acetaminophen (TYLENOL) 500 MG tablet Take 500 mg by mouth every 6 (six) hours as needed.  . Homeopathic Products ([employer] LEG CRAMPS PAIN RELIEF PO) Take by mouth as needed.  Marland Kitchen ipratropium (ATROVENT) 0.03 % nasal spray Place 2 sprays into both nostrils every 12 (twelve) hours.  Marland Kitchen levothyroxine (SYNTHROID, LEVOTHROID) 75 MCG tablet TAKE ONE TABLET BY MOUTH ONCE DAILY  . lisinopril (PRINIVIL,ZESTRIL) 20 MG tablet Take 1 tablet (20 mg total) by mouth daily.  . pantoprazole (PROTONIX) 40 MG tablet TAKE 1 TABLET BY MOUTH ONCE DAILY  . traMADol (ULTRAM) 50 MG tablet TAKE 1 TABLET BY MOUTH AT BEDTIME AS NEEDED  . [DISCONTINUED] lisinopril (PRINIVIL,ZESTRIL) 10 MG tablet TAKE ONE TABLET BY MOUTH ONCE DAILY   No facility-administered encounter medications on file as of 12/18/2017.     Review of Systems  Constitutional: Negative for appetite change and unexpected weight change.  HENT: Negative for congestion and sinus pressure.   Respiratory: Negative for cough, chest tightness and shortness of breath.   Cardiovascular: Positive for chest pain. Negative for  palpitations and leg swelling.  Gastrointestinal: Negative for abdominal pain, diarrhea, nausea and vomiting.  Genitourinary:       Wants urine checked.  Some frequency.    Musculoskeletal: Positive for back pain. Negative for myalgias.  Skin: Negative for color change and rash.  Neurological: Negative for dizziness, light-headedness and headaches.  Psychiatric/Behavioral: Negative for agitation and dysphoric mood.       Objective:     Blood pressure rechecked by me:  156/78  Physical Exam  Constitutional: She appears well-developed and well-nourished. No distress.  HENT:  Nose: Nose normal.  Mouth/Throat: Oropharynx is clear and moist.   Neck: Neck supple. No thyromegaly present.  Cardiovascular: Normal rate and regular rhythm.  Pulmonary/Chest: Breath sounds normal. No respiratory distress. She has no wheezes.  Abdominal: Soft. Bowel sounds are normal. There is no tenderness.  Musculoskeletal: She exhibits no edema or tenderness.  Lymphadenopathy:    She has no cervical adenopathy.  Skin: No rash noted. No erythema.  Psychiatric: She has a normal mood and affect. Her behavior is normal.    BP (!) 170/70 (BP Location: Left Arm, Patient Position: Sitting, Cuff Size: Normal)   Pulse 62   Temp 98 F (36.7 C) (Oral)   Resp 16   Wt 143 lb 12.8 oz (65.2 kg)   SpO2 94%   BMI 25.48 kg/m  Wt Readings from Last 3 Encounters:  12/18/17 143 lb 12.8 oz (65.2 kg)  08/07/17 144 lb 3.2 oz (65.4 kg)  03/27/17 147 lb 6.4 oz (66.9 kg)     Lab Results  Component Value Date   WBC 7.5 12/18/2017   HGB 12.3 12/18/2017   HCT 37.4 12/18/2017   PLT 300.0 12/18/2017   GLUCOSE 105 (H) 12/18/2017   CHOL 190 08/07/2017   TRIG 112.0 08/07/2017   HDL 66.30 08/07/2017   LDLDIRECT 123.3 10/24/2012   LDLCALC 102 (H) 08/07/2017   ALT 8 12/18/2017   AST 14 12/18/2017   NA 141 12/18/2017   K 4.4 12/18/2017   CL 104 12/18/2017   CREATININE 0.69 12/18/2017   BUN 10 12/18/2017   CO2 31 12/18/2017   TSH 0.67 08/07/2017   HGBA1C 5.9 12/18/2017    Dg Ugi W/o Kub  Result Date: 12/02/2015 CLINICAL DATA:  Dysphagia, acid reflux EXAM: UPPER GI SERIES WITHOUT KUB TECHNIQUE: Routine upper GI series was performed with thick and thin barium. FLUOROSCOPY TIME:  Radiation Exposure Index (as provided by the fluoroscopic device): 5.8 mGy COMPARISON:  None. FINDINGS: Limited evaluation secondary to difficulty in patient positioning due to severe kyphosis and back pain. Examination of the esophagus demonstrated normal esophageal motility. Normal esophageal morphology without evidence of esophagitis or ulceration. No esophageal stricture, diverticula,  or mass lesion. No evidence of hiatal hernia. There is severe gastroesophageal reflux. Examination of the stomach demonstrated normal rugal folds and areae gastricae. The gastric mucosa appeared unremarkable without evidence of ulceration, scarring, or mass lesion. Gastric motility and emptying was normal. Fluoroscopic examination of the duodenum demonstrates normal motility and morphology without evidence of ulceration or mass lesion. At the end of the examination a 13 mm barium tablet was administered which transited through the esophagus and esophagogastric junction without delay. IMPRESSION: 1. Severe gastroesophageal reflux. Electronically Signed   By: Kathreen Devoid   On: 12/02/2015 11:57       Assessment & Plan:   Problem List Items Addressed This Visit    Back pain    Persistent.  Has tramadol.  Desires no further testing,  evaluation or treatment.        Relevant Medications   meloxicam (MOBIC) 7.5 MG tablet   Chest pain - Primary    Had the one episode as outlined.  Unclear etiology.  EKG - SR with no acute ischemic changes.  Discussed with her today. Acid reflux appears to be overall stable/controlled.  Discussed further cardiac w/up.  She declines. Wants to monitor.        Relevant Orders   EKG 12-Lead (Completed)   Essential (primary) hypertension    Blood pressure elevated.  Increase lisinopril to 20mg  q day.  Follow pressures.  Check metabolic panel.        Relevant Medications   lisinopril (PRINIVIL,ZESTRIL) 20 MG tablet   GERD (gastroesophageal reflux disease)    On protonix.  Does not report increased acid reflux currently.  Follow.  Has declined further evaluation.        Hypercholesterolemia    Low cholesterol diet and exercise.  Follow lipid panel.        Relevant Medications   lisinopril (PRINIVIL,ZESTRIL) 20 MG tablet   Hyperglycemia   Relevant Orders   Hemoglobin A1c (Completed)   Hypertension    Blood pressure remains elevated.  Increase lisinopril to 20mg   q day.  Follow pressures. Check metabolic panel.        Relevant Medications   lisinopril (PRINIVIL,ZESTRIL) 20 MG tablet   Other Relevant Orders   CBC with Differential/Platelet (Completed)   Hepatic function panel (Completed)   Basic metabolic panel (Completed)   Urinalysis, Routine w reflex microscopic (Completed)   Hypothyroidism    On thyroid replacement.  Follow tsh.       Lymphoma ([place]) (Chronic)    Discussed with her today.  Previous worked up and followed by Dr Oliva Bustard.  Discussed further scanning and f/u.  She declines.        Relevant Medications   meloxicam (MOBIC) 7.5 MG tablet   Renal mass    Evaluated by urology and oncology.  Discussed again with her today.  She declines further testing or scanning.        Urinary frequency   Relevant Orders   Urine Culture (Completed)       Einar Pheasant, MD

## 2017-12-19 LAB — HEPATIC FUNCTION PANEL
ALK PHOS: 50 U/L (ref 39–117)
ALT: 8 U/L (ref 0–35)
AST: 14 U/L (ref 0–37)
Albumin: 4.4 g/dL (ref 3.5–5.2)
BILIRUBIN DIRECT: 0 mg/dL (ref 0.0–0.3)
BILIRUBIN TOTAL: 0.4 mg/dL (ref 0.2–1.2)
Total Protein: 7.4 g/dL (ref 6.0–8.3)

## 2017-12-19 LAB — CBC WITH DIFFERENTIAL/PLATELET
BASOS ABS: 0.1 10*3/uL (ref 0.0–0.1)
BASOS PCT: 1.2 % (ref 0.0–3.0)
EOS ABS: 0.1 10*3/uL (ref 0.0–0.7)
Eosinophils Relative: 1.8 % (ref 0.0–5.0)
HCT: 37.4 % (ref 36.0–46.0)
HEMOGLOBIN: 12.3 g/dL (ref 12.0–15.0)
LYMPHS PCT: 29.1 % (ref 12.0–46.0)
Lymphs Abs: 2.2 10*3/uL (ref 0.7–4.0)
MCHC: 32.8 g/dL (ref 30.0–36.0)
MCV: 94.8 fl (ref 78.0–100.0)
Monocytes Absolute: 0.7 10*3/uL (ref 0.1–1.0)
Monocytes Relative: 8.9 % (ref 3.0–12.0)
Neutro Abs: 4.4 10*3/uL (ref 1.4–7.7)
Neutrophils Relative %: 59 % (ref 43.0–77.0)
Platelets: 300 10*3/uL (ref 150.0–400.0)
RBC: 3.94 Mil/uL (ref 3.87–5.11)
RDW: 13.9 % (ref 11.5–15.5)
WBC: 7.5 10*3/uL (ref 4.0–10.5)

## 2017-12-19 LAB — BASIC METABOLIC PANEL
BUN: 10 mg/dL (ref 6–23)
CALCIUM: 9.3 mg/dL (ref 8.4–10.5)
CO2: 31 meq/L (ref 19–32)
CREATININE: 0.69 mg/dL (ref 0.40–1.20)
Chloride: 104 mEq/L (ref 96–112)
GFR: 85.49 mL/min (ref >60.00)
GLUCOSE: 105 mg/dL — AB (ref 70–99)
Potassium: 4.4 mEq/L (ref 3.5–5.1)
SODIUM: 141 meq/L (ref 135–145)

## 2017-12-19 LAB — URINALYSIS, ROUTINE W REFLEX MICROSCOPIC
BILIRUBIN URINE: NEGATIVE
Hgb urine dipstick: NEGATIVE
Ketones, ur: NEGATIVE
LEUKOCYTES UA: NEGATIVE
Nitrite: NEGATIVE
PH: 7 (ref 5.0–8.0)
Specific Gravity, Urine: 1.015 (ref 1.000–1.030)
Total Protein, Urine: NEGATIVE
Urine Glucose: NEGATIVE
Urobilinogen, UA: 0.2 (ref 0.0–1.0)

## 2017-12-19 LAB — HEMOGLOBIN A1C: HEMOGLOBIN A1C: 5.9 % (ref 4.6–6.5)

## 2017-12-21 ENCOUNTER — Encounter: Payer: Self-pay | Admitting: Internal Medicine

## 2017-12-21 DIAGNOSIS — R079 Chest pain, unspecified: Secondary | ICD-10-CM | POA: Insufficient documentation

## 2017-12-21 NOTE — Assessment & Plan Note (Signed)
Blood pressure remains elevated.  Increase lisinopril to 20mg  q day.  Follow pressures. Check metabolic panel.

## 2017-12-21 NOTE — Assessment & Plan Note (Signed)
Blood pressure elevated.  Increase lisinopril to 20mg  q day.  Follow pressures.  Check metabolic panel.

## 2017-12-21 NOTE — Assessment & Plan Note (Signed)
On thyroid replacement.  Follow tsh.  

## 2017-12-21 NOTE — Assessment & Plan Note (Signed)
On protonix.  Does not report increased acid reflux currently.  Follow.  Has declined further evaluation.

## 2017-12-21 NOTE — Assessment & Plan Note (Signed)
Evaluated by urology and oncology.  Discussed again with her today.  She declines further testing or scanning.

## 2017-12-21 NOTE — Assessment & Plan Note (Signed)
Low cholesterol diet and exercise.  Follow lipid panel.   

## 2017-12-21 NOTE — Assessment & Plan Note (Signed)
Discussed with her today.  Previous worked up and followed by Dr Oliva Bustard.  Discussed further scanning and f/u.  She declines.

## 2017-12-21 NOTE — Assessment & Plan Note (Signed)
Had the one episode as outlined.  Unclear etiology.  EKG - SR with no acute ischemic changes.  Discussed with her today. Acid reflux appears to be overall stable/controlled.  Discussed further cardiac w/up.  She declines. Wants to monitor.

## 2017-12-21 NOTE — Assessment & Plan Note (Signed)
Persistent.  Has tramadol.  Desires no further testing, evaluation or treatment.

## 2017-12-31 ENCOUNTER — Telehealth: Payer: Self-pay | Admitting: Internal Medicine

## 2017-12-31 ENCOUNTER — Encounter: Payer: Self-pay | Admitting: Internal Medicine

## 2017-12-31 NOTE — Telephone Encounter (Signed)
Copied from [place] [id number]. Topic: Quick Communication - See Telephone Encounter >> Dec 31, 2017 11:01 AM Synthia Innocent wrote: CRM for notification. See Telephone encounter for: Needs letter stating that her mail box needs to be moved closer to the house due to the walking distance  12/31/17.

## 2017-12-31 NOTE — Telephone Encounter (Signed)
OK to do so

## 2017-12-31 NOTE — Telephone Encounter (Signed)
Letter typed

## 2017-12-31 NOTE — Telephone Encounter (Signed)
Please advise 

## 2018-01-02 NOTE — Telephone Encounter (Signed)
Letter placed up front for pick up. Daughter is aware.

## 2018-02-02 ENCOUNTER — Other Ambulatory Visit: Payer: Self-pay | Admitting: Internal Medicine

## 2018-02-04 NOTE — Telephone Encounter (Signed)
Last fill 11/18 30 2  refills last OV 12/18/17 ok to fill?

## 2018-02-05 NOTE — Telephone Encounter (Signed)
Rx faxed to [facility]

## 2018-02-19 ENCOUNTER — Other Ambulatory Visit: Payer: Self-pay | Admitting: Internal Medicine

## 2018-02-19 NOTE — Telephone Encounter (Signed)
Copied from [place]. Topic: Inquiry >> Feb 19, 2018 11:13 AM Pricilla Handler wrote: Reason for CRM: Patient needs a refill of TraMADol (ULTRAM) 50 MG tablet. Patient's preferred pharmacy is: [facility] [address] [place] [phone] (Phone)  [phone] (Fax).       Thank You!!!

## 2018-02-19 NOTE — Telephone Encounter (Signed)
Rx refill request: Ultram 50 mg  LOV: 12/18/17   PCP: [place]:  verified

## 2018-02-20 MED ORDER — TRAMADOL HCL 50 MG PO TABS: 50.0000 mg | ORAL_TABLET | Freq: Every evening | ORAL | 2 refills | Status: DC | PRN

## 2018-03-05 LAB — URINE CULTURE
MICRO NUMBER:: 90060462
SPECIMEN QUALITY: ADEQUATE

## 2018-03-08 ENCOUNTER — Ambulatory Visit: Payer: Medicare Other | Admitting: Internal Medicine

## 2018-03-28 ENCOUNTER — Other Ambulatory Visit: Payer: Self-pay

## 2018-03-28 ENCOUNTER — Emergency Department: Payer: No Typology Code available for payment source

## 2018-03-28 ENCOUNTER — Encounter: Payer: Self-pay | Admitting: Emergency Medicine

## 2018-03-28 ENCOUNTER — Emergency Department
Admission: EM | Admit: 2018-03-28 | Discharge: 2018-03-28 | Disposition: A | Discharge status: Home / Self Care | Payer: No Typology Code available for payment source | Attending: Emergency Medicine | Admitting: Emergency Medicine

## 2018-03-28 DIAGNOSIS — Z23 Encounter for immunization: Secondary | ICD-10-CM | POA: Insufficient documentation

## 2018-03-28 DIAGNOSIS — S61411A Laceration without foreign body of right hand, initial encounter: Secondary | ICD-10-CM | POA: Diagnosis not present

## 2018-03-28 DIAGNOSIS — Y9389 Activity, other specified: Secondary | ICD-10-CM | POA: Insufficient documentation

## 2018-03-28 DIAGNOSIS — S199XXA Unspecified injury of neck, initial encounter: Secondary | ICD-10-CM | POA: Diagnosis not present

## 2018-03-28 DIAGNOSIS — Y999 Unspecified external cause status: Secondary | ICD-10-CM | POA: Diagnosis not present

## 2018-03-28 DIAGNOSIS — S60511A Abrasion of right hand, initial encounter: Secondary | ICD-10-CM | POA: Insufficient documentation

## 2018-03-28 DIAGNOSIS — S3991XA Unspecified injury of abdomen, initial encounter: Secondary | ICD-10-CM | POA: Diagnosis not present

## 2018-03-28 DIAGNOSIS — S0990XA Unspecified injury of head, initial encounter: Secondary | ICD-10-CM | POA: Diagnosis not present

## 2018-03-28 DIAGNOSIS — E039 Hypothyroidism, unspecified: Secondary | ICD-10-CM | POA: Diagnosis not present

## 2018-03-28 DIAGNOSIS — S20212A Contusion of left front wall of thorax, initial encounter: Secondary | ICD-10-CM | POA: Diagnosis not present

## 2018-03-28 DIAGNOSIS — S2020XA Contusion of thorax, unspecified, initial encounter: Secondary | ICD-10-CM | POA: Diagnosis not present

## 2018-03-28 DIAGNOSIS — Y92410 Unspecified street and highway as the place of occurrence of the external cause: Secondary | ICD-10-CM | POA: Insufficient documentation

## 2018-03-28 DIAGNOSIS — S20219A Contusion of unspecified front wall of thorax, initial encounter: Secondary | ICD-10-CM

## 2018-03-28 DIAGNOSIS — I10 Essential (primary) hypertension: Secondary | ICD-10-CM | POA: Diagnosis not present

## 2018-03-28 DIAGNOSIS — S299XXA Unspecified injury of thorax, initial encounter: Secondary | ICD-10-CM | POA: Diagnosis not present

## 2018-03-28 DIAGNOSIS — Z79899 Other long term (current) drug therapy: Secondary | ICD-10-CM | POA: Diagnosis not present

## 2018-03-28 DIAGNOSIS — S20211A Contusion of right front wall of thorax, initial encounter: Secondary | ICD-10-CM | POA: Diagnosis not present

## 2018-03-28 DIAGNOSIS — R1012 Left upper quadrant pain: Secondary | ICD-10-CM | POA: Diagnosis not present

## 2018-03-28 HISTORY — DX: Unspecified osteoarthritis, unspecified site: M19.90

## 2018-03-28 LAB — COMPREHENSIVE METABOLIC PANEL
ALBUMIN: 4.2 g/dL (ref 3.5–5.0)
ALT: 23 U/L (ref 14–54)
ANION GAP: 6 (ref 5–15)
AST: 37 U/L (ref 15–41)
Alkaline Phosphatase: 50 U/L (ref 38–126)
BILIRUBIN TOTAL: 0.6 mg/dL (ref 0.3–1.2)
BUN: 14 mg/dL (ref 6–20)
CO2: 26 mmol/L (ref 22–32)
Calcium: 8.7 mg/dL — ABNORMAL LOW (ref 8.9–10.3)
Chloride: 107 mmol/L (ref 101–111)
Creatinine, Ser: 0.68 mg/dL (ref 0.44–1.00)
GFR calc Af Amer: >60 mL/min (ref >60)
Glucose, Bld: 112 mg/dL — ABNORMAL HIGH (ref 65–99)
POTASSIUM: 4.5 mmol/L (ref 3.5–5.1)
Sodium: 139 mmol/L (ref 135–145)
TOTAL PROTEIN: 7.6 g/dL (ref 6.5–8.1)

## 2018-03-28 LAB — CBC WITH DIFFERENTIAL/PLATELET
BASOS PCT: 1 %
Basophils Absolute: 0.1 10*3/uL (ref 0–0.1)
EOS PCT: 2 %
Eosinophils Absolute: 0.1 10*3/uL (ref 0–0.7)
HEMATOCRIT: 36.9 % (ref 35.0–47.0)
Hemoglobin: 12.4 g/dL (ref 12.0–16.0)
Lymphocytes Relative: 19 %
Lymphs Abs: 1.6 10*3/uL (ref 1.0–3.6)
MCH: 31.5 pg (ref 26.0–34.0)
MCHC: 33.5 g/dL (ref 32.0–36.0)
MCV: 94 fL (ref 80.0–100.0)
MONO ABS: 0.7 10*3/uL (ref 0.2–0.9)
Monocytes Relative: 8 %
NEUTROS ABS: 5.9 10*3/uL (ref 1.4–6.5)
Neutrophils Relative %: 70 %
Platelets: 305 10*3/uL (ref 150–440)
RBC: 3.93 MIL/uL (ref 3.80–5.20)
RDW: 14.3 % (ref 11.5–14.5)
WBC: 8.4 10*3/uL (ref 3.6–11.0)

## 2018-03-28 LAB — TROPONIN I: Troponin I: <0.03 ng/mL (ref <0.03)

## 2018-03-28 MED ORDER — SODIUM CHLORIDE 0.9 % IV BOLUS
500.0000 mL | Freq: Once | INTRAVENOUS | Status: AC
  Administered 2018-03-28: 500 mL via INTRAVENOUS

## 2018-03-28 MED ORDER — ACETAMINOPHEN 325 MG PO TABS
650.0000 mg | ORAL_TABLET | Freq: Once | ORAL | Status: AC
  Administered 2018-03-28: 650 mg via ORAL

## 2018-03-28 MED ORDER — TETANUS-DIPHTH-ACELL PERTUSSIS 5-2.5-18.5 LF-MCG/0.5 IM SUSP
0.5000 mL | Freq: Once | INTRAMUSCULAR | Status: AC
  Administered 2018-03-28: 0.5 mL via INTRAMUSCULAR
  Filled 2018-03-28: qty 0.5

## 2018-03-28 MED ORDER — ACETAMINOPHEN 325 MG PO TABS
ORAL_TABLET | ORAL | Status: AC
  Administered 2018-03-28: 650 mg via ORAL
  Filled 2018-03-28: qty 2

## 2018-03-28 MED ORDER — IOPAMIDOL (ISOVUE-370) INJECTION 76%
75.0000 mL | Freq: Once | INTRAVENOUS | Status: AC | PRN
  Administered 2018-03-28: 75 mL via INTRAVENOUS
  Filled 2018-03-28: qty 75
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
## 2018-03-28 NOTE — ED Notes (Signed)
Skin tear cleaned at this time and steri strip applied

## 2018-03-29 ENCOUNTER — Telehealth: Payer: Self-pay

## 2018-03-29 NOTE — Telephone Encounter (Signed)
Copied from [place]. Topic: Appointment Scheduling - Scheduling Inquiry for Clinic >> Mar 29, 2018 10:45 AM Antonieta Iba C wrote: Trixie Dredge - [phone] (daughter) - pt's daughter called in to schedule a ED follow up with PCP. Not showing an opening soon. Daughter would like to know if provider is able to work her in?   Daughter says that it is okay to lvm with date & time if no answer IF provider is able to work pt in.   Please advise.

## 2018-03-29 NOTE — Telephone Encounter (Signed)
Please schedule

## 2018-03-29 NOTE — Telephone Encounter (Signed)
Lm  To schedule appt

## 2018-04-04 ENCOUNTER — Encounter: Payer: Self-pay | Admitting: Internal Medicine

## 2018-04-04 ENCOUNTER — Ambulatory Visit (INDEPENDENT_AMBULATORY_CARE_PROVIDER_SITE_OTHER): Payer: Medicare Other | Admitting: Internal Medicine

## 2018-04-04 DIAGNOSIS — I10 Essential (primary) hypertension: Secondary | ICD-10-CM

## 2018-04-04 DIAGNOSIS — K219 Gastro-esophageal reflux disease without esophagitis: Secondary | ICD-10-CM

## 2018-04-04 NOTE — Patient Instructions (Signed)
You can take tylenol extra strength - 2 tablets twice a day as needed for pain.  Take tramadol - one tablet twice a day as needed for pain.
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
## 2018-04-10 ENCOUNTER — Encounter: Payer: Self-pay | Admitting: Internal Medicine
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
## 2018-04-10 NOTE — Assessment & Plan Note (Signed)
No problems reported today.  On protonix.
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
## 2018-04-14 ENCOUNTER — Other Ambulatory Visit: Payer: Self-pay | Admitting: Internal Medicine

## 2018-05-14 ENCOUNTER — Ambulatory Visit (INDEPENDENT_AMBULATORY_CARE_PROVIDER_SITE_OTHER): Payer: Medicare Other

## 2018-05-14 ENCOUNTER — Encounter: Payer: Self-pay | Admitting: Internal Medicine

## 2018-05-14 ENCOUNTER — Ambulatory Visit (INDEPENDENT_AMBULATORY_CARE_PROVIDER_SITE_OTHER): Payer: Medicare Other | Admitting: Internal Medicine

## 2018-05-14 DIAGNOSIS — E039 Hypothyroidism, unspecified: Secondary | ICD-10-CM

## 2018-05-14 DIAGNOSIS — I10 Essential (primary) hypertension: Secondary | ICD-10-CM

## 2018-05-14 DIAGNOSIS — R079 Chest pain, unspecified: Secondary | ICD-10-CM

## 2018-05-14 DIAGNOSIS — H6123 Impacted cerumen, bilateral: Secondary | ICD-10-CM

## 2018-05-14 DIAGNOSIS — M25552 Pain in left hip: Secondary | ICD-10-CM | POA: Diagnosis not present

## 2018-05-14 DIAGNOSIS — M545 Low back pain, unspecified: Secondary | ICD-10-CM

## 2018-05-14 DIAGNOSIS — K219 Gastro-esophageal reflux disease without esophagitis: Secondary | ICD-10-CM | POA: Diagnosis not present

## 2018-05-14 DIAGNOSIS — G8929 Other chronic pain: Secondary | ICD-10-CM | POA: Diagnosis not present

## 2018-05-14 DIAGNOSIS — E7800 Pure hypercholesterolemia, unspecified: Secondary | ICD-10-CM | POA: Diagnosis not present

## 2018-05-14 DIAGNOSIS — R739 Hyperglycemia, unspecified: Secondary | ICD-10-CM | POA: Diagnosis not present

## 2018-05-14 DIAGNOSIS — C8590 Non-Hodgkin lymphoma, unspecified, unspecified site: Secondary | ICD-10-CM

## 2018-05-14 DIAGNOSIS — S3982XA Other specified injuries of lower back, initial encounter: Secondary | ICD-10-CM | POA: Diagnosis not present

## 2018-05-14 MED ORDER — LISINOPRIL 20 MG PO TABS: 20.0000 mg | ORAL_TABLET | Freq: Every day | ORAL | 2 refills | Status: DC

## 2018-05-14 MED ORDER — PANTOPRAZOLE SODIUM 40 MG PO TBEC: 40.0000 mg | DELAYED_RELEASE_TABLET | Freq: Two times a day (BID) | ORAL | 2 refills | Status: DC
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
## 2018-05-14 NOTE — Patient Instructions (Signed)
Increase protonix to twice a day.  Take one tablet 30 minutes before breakfast and one tablet 30 minutes before your evening meal.

## 2018-05-15 ENCOUNTER — Telehealth: Payer: Self-pay | Admitting: Internal Medicine

## 2018-05-15 DIAGNOSIS — S32030A Wedge compression fracture of third lumbar vertebra, initial encounter for closed fracture: Secondary | ICD-10-CM

## 2018-05-15 NOTE — Telephone Encounter (Signed)
Copied from [place] [id number]. Topic: Quick Communication - See Telephone Encounter >> May 15, 2018  2:11 PM Bea Graff, NT wrote: CRM for notification. See Telephone encounter for: 05/15/18. Pts daughter calling and would like to have the order placed for her mom to have an MRI. Please advise.

## 2018-05-15 NOTE — Telephone Encounter (Signed)
Please advise 

## 2018-05-15 NOTE — Telephone Encounter (Signed)
Please place orders for MRI

## 2018-05-15 NOTE — Telephone Encounter (Signed)
Order placed for MRI L- S spine.

## 2018-05-24 ENCOUNTER — Encounter: Payer: Self-pay | Admitting: Internal Medicine

## 2018-05-24 NOTE — Assessment & Plan Note (Signed)
Persistent elevated blood pressure.  Increase lisinopril to 20mg  q day.  Follow pressures.  Follow metabolic panel.

## 2018-05-24 NOTE — Assessment & Plan Note (Signed)
Low carb diet and exercise.  Follow met b and a1c.  

## 2018-05-24 NOTE — Assessment & Plan Note (Signed)
On protonix.  Some occasional nausea.  Also some discomfort as outlined.  Increase protonix to bid.  Follow.  May need f/u with GI.  Will have cardiac evaluation as outlined.

## 2018-05-24 NOTE — Assessment & Plan Note (Signed)
Desires to have ear irrigated today.  Nurse to irrigate.

## 2018-05-24 NOTE — Assessment & Plan Note (Signed)
Persistent pain.  Check L-S spine xray and left hip xray.
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
## 2018-05-24 NOTE — Assessment & Plan Note (Signed)
Low cholesterol diet and exercise.  Follow lipid panel.   

## 2018-05-24 NOTE — Assessment & Plan Note (Signed)
Has had a few episodes of chest pain as outlined.  Unclear etiology.  EKG SR with no acute ischemic changes.  Discussed with her and her daughter today.  She feels probably related to her back pain, etc. Given persistent intermittent symptoms and given risk factors, will have her see cardiology for question of need for further evaluation.  Treat acid reflux with increase protonix.

## 2018-05-24 NOTE — Assessment & Plan Note (Addendum)
On thyroid replacement.  Follow tsh.  

## 2018-05-24 NOTE — Assessment & Plan Note (Signed)
Have discussed previous w/up by Dr Oliva Bustard.  Have discussed further f/u scanning and w/up.  She has declined.

## 2018-05-29 ENCOUNTER — Ambulatory Visit
Admission: RE | Admit: 2018-05-29 | Discharge: 2018-05-29 | Disposition: A | Discharge status: Home / Self Care | Payer: Medicare Other | Source: Ambulatory Visit | Attending: Internal Medicine | Admitting: Internal Medicine

## 2018-05-29 DIAGNOSIS — S3219XA Other fracture of sacrum, initial encounter for closed fracture: Secondary | ICD-10-CM | POA: Diagnosis not present

## 2018-05-29 DIAGNOSIS — M4186 Other forms of scoliosis, lumbar region: Secondary | ICD-10-CM | POA: Diagnosis not present

## 2018-05-29 DIAGNOSIS — M47816 Spondylosis without myelopathy or radiculopathy, lumbar region: Secondary | ICD-10-CM | POA: Diagnosis not present

## 2018-05-29 DIAGNOSIS — S32030A Wedge compression fracture of third lumbar vertebra, initial encounter for closed fracture: Secondary | ICD-10-CM | POA: Insufficient documentation

## 2018-05-29 DIAGNOSIS — S3992XA Unspecified injury of lower back, initial encounter: Secondary | ICD-10-CM | POA: Diagnosis not present

## 2018-05-29 DIAGNOSIS — X58XXXA Exposure to other specified factors, initial encounter: Secondary | ICD-10-CM | POA: Insufficient documentation

## 2018-05-29 DIAGNOSIS — M549 Dorsalgia, unspecified: Secondary | ICD-10-CM | POA: Diagnosis not present

## 2018-05-30 DIAGNOSIS — E039 Hypothyroidism, unspecified: Secondary | ICD-10-CM | POA: Diagnosis not present

## 2018-05-30 DIAGNOSIS — I10 Essential (primary) hypertension: Secondary | ICD-10-CM | POA: Diagnosis not present

## 2018-05-30 DIAGNOSIS — N2889 Other specified disorders of kidney and ureter: Secondary | ICD-10-CM | POA: Diagnosis not present

## 2018-05-30 DIAGNOSIS — K219 Gastro-esophageal reflux disease without esophagitis: Secondary | ICD-10-CM | POA: Diagnosis not present

## 2018-05-30 DIAGNOSIS — R0789 Other chest pain: Secondary | ICD-10-CM | POA: Diagnosis not present

## 2018-05-30 DIAGNOSIS — E7800 Pure hypercholesterolemia, unspecified: Secondary | ICD-10-CM | POA: Diagnosis not present

## 2018-05-30 DIAGNOSIS — R079 Chest pain, unspecified: Secondary | ICD-10-CM | POA: Diagnosis not present

## 2018-06-04 ENCOUNTER — Other Ambulatory Visit: Payer: Self-pay | Admitting: Internal Medicine

## 2018-06-04 ENCOUNTER — Telehealth: Payer: Self-pay

## 2018-06-04 DIAGNOSIS — M545 Low back pain, unspecified: Secondary | ICD-10-CM

## 2018-06-04 DIAGNOSIS — G8929 Other chronic pain: Secondary | ICD-10-CM

## 2018-06-04 DIAGNOSIS — S3210XD Unspecified fracture of sacrum, subsequent encounter for fracture with routine healing: Secondary | ICD-10-CM

## 2018-06-04 NOTE — Progress Notes (Signed)
Order placed for ortho referral.   

## 2018-06-04 NOTE — Telephone Encounter (Signed)
Copied from [place] [phone]. Topic: General - Other >> Jun 04, 2018  9:17 AM Oneta Rack wrote: Osvaldo Human name: [name]  Relation to pt: daughter  Call back number: [phone]    Reason for call:  Patient inquiring about imaging results, please advise

## 2018-06-05 ENCOUNTER — Telehealth: Payer: Self-pay | Admitting: *Deleted

## 2018-06-05 NOTE — Telephone Encounter (Signed)
LMTCB OK for PEC to speak with patient.

## 2018-06-05 NOTE — Telephone Encounter (Signed)
Copied from [place] [phone]. Topic: General - Other >> Jun 04, 2018  9:17 AM Oneta Rack wrote: Osvaldo Human name: [name]  Relation to pt: daughter  Call back number: [phone]    Reason for call:  Patient inquiring about imaging results, please advise >> Jun 05, 2018  2:55 PM Neva Seat wrote: Gatha Mayer returned missed call.  Please call her back asap. Call back number: [phone]

## 2018-06-07 NOTE — Telephone Encounter (Signed)
Called [name] regarding her moms results. Results look like they have already been given to them. Please see result note. OK for PEC to speak with her.

## 2018-08-08 ENCOUNTER — Ambulatory Visit (INDEPENDENT_AMBULATORY_CARE_PROVIDER_SITE_OTHER): Payer: Medicare Other

## 2018-08-08 VITALS — BP 148/88 | HR 54 | Temp 98.2°F | Resp 16 | Ht 59.0 in | Wt 131.4 lb

## 2018-08-08 DIAGNOSIS — Z0000 Encounter for general adult medical examination without abnormal findings: Secondary | ICD-10-CM

## 2018-08-08 NOTE — Patient Instructions (Addendum)
  Tasha Avila , Thank you for taking time to come for your Medicare Wellness Visit. I appreciate your ongoing commitment to your health goals. Please review the following plan we discussed and let me know if I can assist you in the future.   Follow up as needed.    Bring a copy of your [place] and/or Living Will to be scanned into chart upon completion.  Have a great day!  These are the goals we discussed: Goals    . Maintain Healthy Lifestyle        This is a list of the screening recommended for you and due dates:  Health Maintenance  Topic Date Due  . DEXA scan (bone density measurement)  03/18/2025*  . Tetanus Vaccine  03/28/2028  . Flu Shot  Discontinued  . Pneumonia vaccines  Discontinued  *Topic was postponed. The date shown is not the original due date.

## 2018-08-08 NOTE — Progress Notes (Signed)
Subjective:   Tasha Avila is a 84 y.o. female who presents for an Initial Medicare Annual Wellness Visit.  Review of Systems    No ROS.  Medicare Wellness Visit. Additional risk factors are reflected in the social history.  Cardiac Risk Factors include: advanced age (>45men, >64 women);hypertension     Objective:    Today's Vitals   08/08/18 1452 08/08/18 1507  BP: (!) 148/88   Pulse: (!) 54   Resp: 16   Temp: 98.2 F (36.8 C)   TempSrc: Oral   SpO2: 95%   Weight: 131 lb 6.4 oz (59.6 kg)   Height: 4\' 11"  (1.499 m)   PainSc:  5    Body mass index is 26.54 kg/m.  Advanced Directives 08/08/2018 12/22/2015 06/01/2015  Does Patient Have a Medical Advance Directive? No No No  Would patient like information on creating a medical advance directive? Yes (MAU/Ambulatory/Procedural Areas - Information given) - -    Current Medications (verified) Outpatient Encounter Medications as of 08/08/2018  Medication Sig  . acetaminophen (TYLENOL) 500 MG tablet Take 500 mg by mouth every 6 (six) hours as needed.  . Homeopathic Products ([employer] LEG CRAMPS PAIN RELIEF PO) Take by mouth as needed.  Marland Kitchen ipratropium (ATROVENT) 0.03 % nasal spray Place 2 sprays into both nostrils every 12 (twelve) hours.  Marland Kitchen levothyroxine (SYNTHROID, LEVOTHROID) 75 MCG tablet TAKE ONE TABLET BY MOUTH ONCE DAILY  . lisinopril (PRINIVIL,ZESTRIL) 20 MG tablet Take 1 tablet (20 mg total) by mouth daily.  . pantoprazole (PROTONIX) 40 MG tablet Take 1 tablet (40 mg total) by mouth 2 (two) times daily before a meal.  . traMADol (ULTRAM) 50 MG tablet Take 1 tablet (50 mg total) by mouth at bedtime as needed.   No facility-administered encounter medications on file as of 08/08/2018.     Allergies (verified) No known drug allergy   History: Past Medical History:  Diagnosis Date  . Anemia   . Arthritis   . GERD (gastroesophageal reflux disease)   . Hypercholesterolemia   . Hypertension   . Hypothyroidism    Past  Surgical History:  Procedure Laterality Date  . ABDOMINAL HYSTERECTOMY  1960s   bladder tack at the same time, ovaries not removed  . LAPAROSCOPIC CHOLECYSTECTOMY  5/11   Family History  Problem Relation Age of Onset  . Lymphoma Brother   . Stroke Brother   . Diabetes Brother   . Diabetes Brother        x2  . Stroke Brother   . Thyroid disease Brother   . Stroke Mother    Social History   Socioeconomic History  . Marital status: Widowed    Spouse name: Not on file  . Number of children: 2  . Years of education: Not on file  . Highest education level: Not on file  Occupational History  . Not on file  Social Needs  . Financial resource strain: Not hard at all  . Food insecurity:    Worry: Never true    Inability: Never true  . Transportation needs:    Medical: No    Non-medical: No  Tobacco Use  . Smoking status: Never Smoker  . Smokeless tobacco: Never Used  Substance and Sexual Activity  . Alcohol use: No    Alcohol/week: 0.0 standard drinks  . Drug use: No  . Sexual activity: Not on file  Lifestyle  . Physical activity:    Days per week: 0 days    Minutes per  session: Not on file  . Stress: Not on file  Relationships  . Social connections:    Talks on phone: Not on file    Gets together: Not on file    Attends religious service: Not on file    Active member of club or organization: Not on file    Attends meetings of clubs or organizations: Not on file    Relationship status: Widowed  Other Topics Concern  . Not on file  Social History Narrative  . Not on file    Tobacco Counseling Counseling given: Not Answered   Clinical Intake:  Pre-visit preparation completed: Yes  Pain : 0-10 Pain Score: 5  Pain Type: Chronic pain Pain Location: Leg Pain Radiating Towards: knees Pain Onset: More than a month ago Pain Frequency: Intermittent Pain Relieving Factors: Tramadol at bedtime as needed. Tylenol during the day as needed. Rest.  Effect of Pain  on Daily Activities: She paces herself between activities.   Pain Relieving Factors: Tramadol at bedtime as needed. Tylenol during the day as needed. Rest.   Nutritional Status: BMI 25 -29 Overweight Diabetes: No  How often do you need to have someone help you when you read instructions, pamphlets, or other written materials from your doctor or pharmacy?: 3 - Sometimes  Interpreter Needed?: No      Activities of Daily Living In your present state of health, do you have any difficulty performing the following activities: 08/08/2018  Hearing? Y  Vision? N  Difficulty concentrating or making decisions? Y  Comment Age appropriate.  Walking or climbing stairs? Y  Dressing or bathing? N  Doing errands, shopping? Y  Comment She no longer drives.   Preparing Food and eating ? N  Using the Toilet? N  In the past six months, have you accidently leaked urine? Y  Comment Intermittent. Managed with a daily pad/brief.  Do you have problems with loss of bowel control? Y  Comment Intermittent. Managed with a daily pad/brief.  Managing your Medications? N  Managing your Finances? Y  Comment Daughter assists as needed.   Housekeeping or managing your Housekeeping? Y  Comment Daughter assists as needed.   Some recent data might be hidden     Immunizations and Health Maintenance Immunization History  Administered Date(s) Administered  . Tdap 03/28/2018   There are no preventive care reminders to display for this patient.  Patient Care Team: Einar Pheasant, MD as PCP - General (Internal Medicine) Einar Pheasant, MD (Internal Medicine)  Indicate any recent Medical Services you may have received from other than [facility] providers in the past year (date may be approximate).     Assessment:   This is a routine wellness examination for Tasha Avila.  The goal of the wellness visit is to assist the patient how to close the gaps in care and create a preventative care plan for the patient.   The  roster of all physicians providing medical care to patient is listed in the Snapshot section of the chart.  Osteoporosis risk reviewed.    Safety issues reviewed; Lives alone. Life alert, smoke and carbon monoxide detectors in the home. No firearms in the home. Wears seatbelts when riding with others. No violence in the home.  They do not have excessive sun exposure.  Discussed the need for sun protection: hats, long sleeves and the use of sunscreen if there is significant sun exposure.  Patient is alert, normal appearance, oriented to person/place/and time.   No new identified risk were noted.  No failures at ADL's or IADL's.  Ambulates with cane.   BMI- discussed.   24 hour diet recall: Regular diet  Dental- UTD. RKristopher Glee.  Eye- Visual acuity not assessed per patient preference since they have regular follow up with the ophthalmologist.  Wears corrective lenses.  Sleep patterns- Sleep is broken through the night due to restless legs, leg cramps and pain.  This is a chronic issue and she declines follow up today.  Managed with medication. Taking all medication as directed. Naps during the day.  Health maintenance gaps- closed.  Patient Concerns: None at this time.Routine maintenance appointment scheduled with primary care provider.   Hearing/Vision screen Hearing Screening Comments: [facility] Last visit 04/2018 Hearing aid, bilateral Vision Screening Comments: Followed by [facility] Wears corrective lenses Last OV 02/2018 Visual acuity not assessed per patient preference since they have regular follow up with the ophthalmologist  Dietary issues and exercise activities discussed: Current Exercise Habits: The patient does not participate in regular exercise at present  Goals    . Maintain Healthy Lifestyle       Depression Screen PHQ 2/9 Scores 08/08/2018 03/27/2017 03/27/2017 03/19/2015 11/13/2013  PHQ - 2 Score 1 0 0 0 0  PHQ- 9  Score - 4 - - -    Fall Risk Fall Risk  08/08/2018 05/14/2018 03/27/2017 03/19/2015 11/13/2013  Falls in the past year? No No No Yes No  Number falls in past yr: - - - 1 -  Injury with Fall? - - - No -  Risk for fall due to : - - History of fall(s) - Impaired balance/gait  Risk for fall due to: Comment - - pt uses cain  - -   Cognitive Function:     6CIT Screen 08/08/2018  What Year? 0 points  What month? 0 points  What time? 0 points  Count back from 20 0 points  Months in reverse 2 points  Repeat phrase 2 points  Total Score 4    Screening Tests Health Maintenance  Topic Date Due  . DEXA SCAN  03/18/2025 (Originally 09/24/1995)  . TETANUS/TDAP  03/28/2028  . INFLUENZA VACCINE  Discontinued  . PNA vac Low Risk Adult  Discontinued     Plan:    End of life planning; Advance aging; Advanced directives discussed. Copy of current HCPOA/Living Will requested upon completion.     I have personally reviewed and noted the following in the patient's chart:   . Medical and social history . Use of alcohol, tobacco or illicit drugs  . Current medications and supplements . Functional ability and status . Nutritional status . Physical activity . Advanced directives . List of other physicians . Hospitalizations, surgeries, and ER visits in previous 12 months . Vitals . Screenings to include cognitive, depression, and falls . Referrals and appointments  In addition, I have reviewed and discussed with patient certain preventive protocols, quality metrics, and best practice recommendations. A written personalized care plan for preventive services as well as general preventive health recommendations were provided to patient.     Varney Biles, LPN   06/08/7208    Reviewed above information.  Agree with assessment and plan.   Dr Nicki Reaper

## 2018-08-13 ENCOUNTER — Ambulatory Visit (INDEPENDENT_AMBULATORY_CARE_PROVIDER_SITE_OTHER): Payer: Medicare Other | Admitting: Internal Medicine

## 2018-08-13 ENCOUNTER — Encounter: Payer: Self-pay | Admitting: Internal Medicine

## 2018-08-13 VITALS — BP 136/62 | HR 56 | Temp 97.5°F | Resp 18 | Wt 134.4 lb

## 2018-08-13 DIAGNOSIS — R350 Frequency of micturition: Secondary | ICD-10-CM | POA: Diagnosis not present

## 2018-08-13 DIAGNOSIS — S32000A Wedge compression fracture of unspecified lumbar vertebra, initial encounter for closed fracture: Secondary | ICD-10-CM | POA: Insufficient documentation

## 2018-08-13 DIAGNOSIS — G8929 Other chronic pain: Secondary | ICD-10-CM | POA: Diagnosis not present

## 2018-08-13 DIAGNOSIS — E7800 Pure hypercholesterolemia, unspecified: Secondary | ICD-10-CM

## 2018-08-13 DIAGNOSIS — R739 Hyperglycemia, unspecified: Secondary | ICD-10-CM

## 2018-08-13 DIAGNOSIS — S32010D Wedge compression fracture of first lumbar vertebra, subsequent encounter for fracture with routine healing: Secondary | ICD-10-CM | POA: Diagnosis not present

## 2018-08-13 DIAGNOSIS — M545 Low back pain, unspecified: Secondary | ICD-10-CM

## 2018-08-13 DIAGNOSIS — I10 Essential (primary) hypertension: Secondary | ICD-10-CM | POA: Diagnosis not present

## 2018-08-13 DIAGNOSIS — K219 Gastro-esophageal reflux disease without esophagitis: Secondary | ICD-10-CM

## 2018-08-13 DIAGNOSIS — E039 Hypothyroidism, unspecified: Secondary | ICD-10-CM

## 2018-08-13 LAB — URINALYSIS, ROUTINE W REFLEX MICROSCOPIC
BILIRUBIN URINE: NEGATIVE
Hgb urine dipstick: NEGATIVE
Ketones, ur: NEGATIVE
Nitrite: NEGATIVE
PH: 5.5 (ref 5.0–8.0)
RBC / HPF: NONE SEEN (ref 0–?)
Specific Gravity, Urine: 1.025 (ref 1.000–1.030)
TOTAL PROTEIN, URINE-UPE24: NEGATIVE
UROBILINOGEN UA: 0.2 (ref 0.0–1.0)
Urine Glucose: NEGATIVE

## 2018-08-13 LAB — BASIC METABOLIC PANEL
BUN: 12 mg/dL (ref 6–23)
CALCIUM: 8.8 mg/dL (ref 8.4–10.5)
CO2: 27 meq/L (ref 19–32)
Chloride: 107 mEq/L (ref 96–112)
Creatinine, Ser: 0.9 mg/dL (ref 0.40–1.20)
GFR: 62.82 mL/min (ref >60.00)
Glucose, Bld: 79 mg/dL (ref 70–99)
POTASSIUM: 4 meq/L (ref 3.5–5.1)
SODIUM: 141 meq/L (ref 135–145)

## 2018-08-13 LAB — HEPATIC FUNCTION PANEL
ALBUMIN: 4.1 g/dL (ref 3.5–5.2)
ALT: 7 U/L (ref 0–35)
AST: 12 U/L (ref 0–37)
Alkaline Phosphatase: 34 U/L — ABNORMAL LOW (ref 39–117)
BILIRUBIN TOTAL: 0.3 mg/dL (ref 0.2–1.2)
Bilirubin, Direct: 0.1 mg/dL (ref 0.0–0.3)
TOTAL PROTEIN: 6.8 g/dL (ref 6.0–8.3)

## 2018-08-13 LAB — LIPID PANEL
CHOLESTEROL: 194 mg/dL (ref 0–200)
HDL: 70.7 mg/dL (ref >39.00)
LDL CALC: 96 mg/dL (ref 0–99)
NonHDL: 123.44
Total CHOL/HDL Ratio: 3
Triglycerides: 137 mg/dL (ref 0.0–149.0)
VLDL: 27.4 mg/dL (ref 0.0–40.0)

## 2018-08-13 LAB — TSH: TSH: 0.57 u[IU]/mL (ref 0.35–4.50)

## 2018-08-13 LAB — HEMOGLOBIN A1C: Hgb A1c MFr Bld: 6 % (ref 4.6–6.5)
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
## 2018-08-15 ENCOUNTER — Other Ambulatory Visit: Payer: Self-pay

## 2018-08-15 LAB — URINE CULTURE
MICRO NUMBER: 91081853
SPECIMEN QUALITY:: ADEQUATE

## 2018-08-15 MED ORDER — CEFDINIR 300 MG PO CAPS: 300.0000 mg | ORAL_CAPSULE | Freq: Two times a day (BID) | ORAL | 0 refills | Status: DC

## 2018-08-18 ENCOUNTER — Encounter: Payer: Self-pay | Admitting: Internal Medicine

## 2018-08-18 NOTE — Assessment & Plan Note (Signed)
Overall controlled on current regimen.  Follow.

## 2018-08-18 NOTE — Assessment & Plan Note (Signed)
On thyroid replacement.  Follow tsh.  

## 2018-08-18 NOTE — Assessment & Plan Note (Signed)
Low cholesterol diet and exercise.  Follow lipid panel.   

## 2018-08-18 NOTE — Assessment & Plan Note (Signed)
Check urinalysis to confirm no infection.  

## 2018-08-18 NOTE — Assessment & Plan Note (Signed)
Blood pressure doing better on current regimen.  Follow pressures.  Follow metabolic panel.  

## 2018-08-18 NOTE — Assessment & Plan Note (Signed)
Desires no further evaluation or treatment.  Has tramadol.  Takes tylenol.  Pain is better.

## 2018-08-18 NOTE — Assessment & Plan Note (Signed)
Blood pressure under good control.  Continue same medication regimen.  Follow pressures.  Follow metabolic panel.   

## 2018-08-18 NOTE — Assessment & Plan Note (Signed)
Low carb diet and exercise.  Follow met b and a1c.  

## 2018-08-18 NOTE — Assessment & Plan Note (Signed)
Recent sacral fracture and compression fracture noted on MRI. Wanted to refer her to Dr Sharlet Salina.  She declined.  Desired on further w/up or evaluation.  Has tramadol.  Takes tylenol.  Pain is better.

## 2018-08-21 ENCOUNTER — Telehealth: Payer: Self-pay | Admitting: Internal Medicine

## 2018-08-21 MED ORDER — LEVOTHYROXINE SODIUM 75 MCG PO TABS: 75.0000 ug | ORAL_TABLET | Freq: Every day | ORAL | 3 refills | Status: DC

## 2018-08-21 NOTE — Telephone Encounter (Signed)
Copied from [place] [id number]. Topic: Quick Communication - Rx Refill/Question >> Aug 21, 2018  2:02 PM Reyne Dumas L wrote: Medication: levothyroxine (SYNTHROID, LEVOTHROID) 75 MCG tablet  Has the patient contacted their pharmacy? No - bottle states no refills left.  Pt is completely out of pills (Agent: If no, request that the patient contact the pharmacy for the refill.) (Agent: If yes, when and what did the pharmacy advise?)  Preferred Pharmacy (with phone number or street name): [place], [place] - [phone] (Phone) [phone] (Fax)  Agent: Please be advised that RX refills may take up to 3 business days. We ask that you follow-up with your pharmacy.

## 2018-08-26 ENCOUNTER — Other Ambulatory Visit: Payer: Self-pay

## 2018-08-26 ENCOUNTER — Other Ambulatory Visit: Payer: Self-pay | Admitting: Internal Medicine

## 2018-08-26 NOTE — Addendum Note (Signed)
Addended by: Lars Masson on: 08/26/2018 04:47 PM   Modules accepted: Orders

## 2018-08-26 NOTE — Telephone Encounter (Signed)
Copied from [place] [id number]. Topic: Quick Communication - Rx Refill/Question >> Aug 26, 2018  9:41 AM Carolyn Stare wrote: Medication   traMADol (ULTRAM) 50 MG tablet  Has the patient contacted their pharmacy yes    Preferred Pharmacy  [facility] [place] [place]   Agent: Please be advised that RX refills may take up to 3 business days. We ask that you follow-up with your pharmacy.

## 2018-08-26 NOTE — Telephone Encounter (Signed)
Last OV 08/13/2018 Next OV 12/09/2018 Last refill 02/20/2018

## 2018-08-27 MED ORDER — TRAMADOL HCL 50 MG PO TABS: 50.0000 mg | ORAL_TABLET | Freq: Every evening | ORAL | 1 refills | Status: DC | PRN

## 2018-08-27 NOTE — Addendum Note (Signed)
Addended by: Alisa Graff on: 08/27/2018 04:19 AM   Modules accepted: Orders

## 2018-08-27 NOTE — Telephone Encounter (Signed)
rx ok'd for tramadol #30 with one refill.   

## 2018-08-28 ENCOUNTER — Other Ambulatory Visit: Payer: Self-pay | Admitting: Internal Medicine

## 2018-08-28 NOTE — Telephone Encounter (Signed)
Faxed

## 2018-09-02 ENCOUNTER — Other Ambulatory Visit: Payer: Self-pay | Admitting: Internal Medicine

## 2018-12-09 ENCOUNTER — Encounter: Payer: Self-pay | Admitting: Internal Medicine

## 2018-12-09 ENCOUNTER — Ambulatory Visit (INDEPENDENT_AMBULATORY_CARE_PROVIDER_SITE_OTHER): Payer: Medicare Other | Admitting: Internal Medicine

## 2018-12-09 VITALS — BP 138/68 | HR 60 | Temp 97.9°F | Resp 16 | Wt 137.0 lb

## 2018-12-09 DIAGNOSIS — N2889 Other specified disorders of kidney and ureter: Secondary | ICD-10-CM

## 2018-12-09 DIAGNOSIS — K219 Gastro-esophageal reflux disease without esophagitis: Secondary | ICD-10-CM

## 2018-12-09 DIAGNOSIS — C8590 Non-Hodgkin lymphoma, unspecified, unspecified site: Secondary | ICD-10-CM

## 2018-12-09 DIAGNOSIS — M545 Low back pain, unspecified: Secondary | ICD-10-CM

## 2018-12-09 DIAGNOSIS — S32010D Wedge compression fracture of first lumbar vertebra, subsequent encounter for fracture with routine healing: Secondary | ICD-10-CM | POA: Diagnosis not present

## 2018-12-09 DIAGNOSIS — E039 Hypothyroidism, unspecified: Secondary | ICD-10-CM | POA: Diagnosis not present

## 2018-12-09 DIAGNOSIS — I10 Essential (primary) hypertension: Secondary | ICD-10-CM | POA: Diagnosis not present

## 2018-12-09 DIAGNOSIS — R739 Hyperglycemia, unspecified: Secondary | ICD-10-CM

## 2018-12-09 DIAGNOSIS — E7800 Pure hypercholesterolemia, unspecified: Secondary | ICD-10-CM | POA: Diagnosis not present

## 2018-12-09 DIAGNOSIS — R350 Frequency of micturition: Secondary | ICD-10-CM | POA: Diagnosis not present

## 2018-12-09 DIAGNOSIS — G8929 Other chronic pain: Secondary | ICD-10-CM | POA: Diagnosis not present
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
## 2018-12-09 NOTE — Patient Instructions (Signed)
Melatonin 3mg   - take 1 hour before bed

## 2018-12-10 LAB — URINALYSIS, ROUTINE W REFLEX MICROSCOPIC
Bilirubin Urine: NEGATIVE
Hgb urine dipstick: NEGATIVE
KETONES UR: NEGATIVE
Nitrite: NEGATIVE
RBC / HPF: NONE SEEN (ref 0–?)
Specific Gravity, Urine: <=1.005 — AB (ref 1.000–1.030)
Total Protein, Urine: NEGATIVE
Urine Glucose: NEGATIVE
Urobilinogen, UA: 0.2 (ref 0.0–1.0)
pH: 7 (ref 5.0–8.0)

## 2018-12-10 LAB — URINE CULTURE
MICRO NUMBER:: 15707
SPECIMEN QUALITY:: ADEQUATE

## 2018-12-10 LAB — HEMOGLOBIN A1C: Hgb A1c MFr Bld: 5.8 % (ref 4.6–6.5)

## 2018-12-10 LAB — CBC WITH DIFFERENTIAL/PLATELET
BASOS PCT: 1.2 % (ref 0.0–3.0)
Basophils Absolute: 0.1 10*3/uL (ref 0.0–0.1)
EOS ABS: 0.1 10*3/uL (ref 0.0–0.7)
Eosinophils Relative: 1.1 % (ref 0.0–5.0)
HCT: 35.7 % — ABNORMAL LOW (ref 36.0–46.0)
Hemoglobin: 11.7 g/dL — ABNORMAL LOW (ref 12.0–15.0)
Lymphocytes Relative: 22.6 % (ref 12.0–46.0)
Lymphs Abs: 2 10*3/uL (ref 0.7–4.0)
MCHC: 32.7 g/dL (ref 30.0–36.0)
MCV: 96.3 fl (ref 78.0–100.0)
Monocytes Absolute: 0.8 10*3/uL (ref 0.1–1.0)
Monocytes Relative: 8.9 % (ref 3.0–12.0)
Neutro Abs: 6 10*3/uL (ref 1.4–7.7)
Neutrophils Relative %: 66.2 % (ref 43.0–77.0)
Platelets: 409 10*3/uL — ABNORMAL HIGH (ref 150.0–400.0)
RBC: 3.7 Mil/uL — ABNORMAL LOW (ref 3.87–5.11)
RDW: 14.2 % (ref 11.5–15.5)
WBC: 9 10*3/uL (ref 4.0–10.5)

## 2018-12-10 LAB — LIPID PANEL
CHOL/HDL RATIO: 2
Cholesterol: 199 mg/dL (ref 0–200)
HDL: 80.3 mg/dL (ref >39.00)
LDL Cholesterol: 100 mg/dL — ABNORMAL HIGH (ref 0–99)
NONHDL: 118.3
TRIGLYCERIDES: 92 mg/dL (ref 0.0–149.0)
VLDL: 18.4 mg/dL (ref 0.0–40.0)

## 2018-12-10 LAB — HEPATIC FUNCTION PANEL
ALK PHOS: 39 U/L (ref 39–117)
ALT: 8 U/L (ref 0–35)
AST: 14 U/L (ref 0–37)
Albumin: 4.4 g/dL (ref 3.5–5.2)
BILIRUBIN DIRECT: 0 mg/dL (ref 0.0–0.3)
Total Bilirubin: 0.2 mg/dL (ref 0.2–1.2)
Total Protein: 7.2 g/dL (ref 6.0–8.3)

## 2018-12-10 LAB — BASIC METABOLIC PANEL
BUN: 10 mg/dL (ref 6–23)
CO2: 30 mEq/L (ref 19–32)
Calcium: 9.4 mg/dL (ref 8.4–10.5)
Chloride: 101 mEq/L (ref 96–112)
Creatinine, Ser: 0.76 mg/dL (ref 0.40–1.20)
GFR: 76.3 mL/min (ref >60.00)
Glucose, Bld: 98 mg/dL (ref 70–99)
Potassium: 4.3 mEq/L (ref 3.5–5.1)
Sodium: 138 mEq/L (ref 135–145)

## 2018-12-11 ENCOUNTER — Other Ambulatory Visit: Payer: Self-pay | Admitting: Internal Medicine

## 2018-12-11 DIAGNOSIS — D649 Anemia, unspecified: Secondary | ICD-10-CM

## 2018-12-11 NOTE — Progress Notes (Signed)
Order placed for f/u labs.  

## 2018-12-14 ENCOUNTER — Encounter: Payer: Self-pay | Admitting: Internal Medicine

## 2018-12-14 NOTE — Assessment & Plan Note (Signed)
S/p sacral fracture and compression fracture noted on MRI.  Wanted to refer her to Dr Sharlet Salina.  She declines.  Desires no further w/up or evaluatin.  Has tramadol.

## 2018-12-14 NOTE — Assessment & Plan Note (Signed)
Blood pressure on recheck improved.  Same medication regimen.  Follow pressures.  Follow metabolic panel.   

## 2018-12-14 NOTE — Assessment & Plan Note (Signed)
On thyroid replacement.  Follow tsh.  

## 2018-12-14 NOTE — Assessment & Plan Note (Signed)
Was evaluated by urology and oncology.  Has declined further w/up or evaluation.

## 2018-12-14 NOTE — Assessment & Plan Note (Signed)
Controlled on current regimen.  Follow.  

## 2018-12-14 NOTE — Assessment & Plan Note (Signed)
Low carb diet and exercise.  Follow met b and a1c.   

## 2018-12-14 NOTE — Assessment & Plan Note (Signed)
Check urine to confirm no infection.  

## 2018-12-14 NOTE — Assessment & Plan Note (Signed)
She has declined further evaluation and w/up.

## 2018-12-14 NOTE — Assessment & Plan Note (Signed)
Desires no further treatment or evaluation.

## 2018-12-14 NOTE — Assessment & Plan Note (Signed)
Low cholesterol diet and exercise.  Follow lipid panel.   

## 2018-12-26 ENCOUNTER — Other Ambulatory Visit (INDEPENDENT_AMBULATORY_CARE_PROVIDER_SITE_OTHER): Payer: Medicare Other

## 2018-12-26 DIAGNOSIS — D649 Anemia, unspecified: Secondary | ICD-10-CM

## 2018-12-27 LAB — CBC WITH DIFFERENTIAL/PLATELET
Basophils Absolute: 0.1 10*3/uL (ref 0.0–0.1)
Basophils Relative: 1 % (ref 0.0–3.0)
Eosinophils Absolute: 0.1 10*3/uL (ref 0.0–0.7)
Eosinophils Relative: 1.3 % (ref 0.0–5.0)
HCT: 34.7 % — ABNORMAL LOW (ref 36.0–46.0)
HEMOGLOBIN: 11.5 g/dL — AB (ref 12.0–15.0)
Lymphocytes Relative: 23.8 % (ref 12.0–46.0)
Lymphs Abs: 1.7 10*3/uL (ref 0.7–4.0)
MCHC: 33.1 g/dL (ref 30.0–36.0)
MCV: 95.1 fl (ref 78.0–100.0)
MONO ABS: 0.6 10*3/uL (ref 0.1–1.0)
Monocytes Relative: 8.7 % (ref 3.0–12.0)
Neutro Abs: 4.7 10*3/uL (ref 1.4–7.7)
Neutrophils Relative %: 65.2 % (ref 43.0–77.0)
Platelets: 309 10*3/uL (ref 150.0–400.0)
RBC: 3.64 Mil/uL — ABNORMAL LOW (ref 3.87–5.11)
RDW: 14.4 % (ref 11.5–15.5)
WBC: 7.2 10*3/uL (ref 4.0–10.5)

## 2018-12-27 LAB — FERRITIN: FERRITIN: 15.3 ng/mL (ref 10.0–291.0)

## 2018-12-27 LAB — IBC PANEL
Iron: 42 ug/dL (ref 42–145)
Saturation Ratios: 12 % — ABNORMAL LOW (ref 20.0–50.0)
Transferrin: 251 mg/dL (ref 212.0–360.0)

## 2018-12-27 LAB — VITAMIN B12: Vitamin B-12: 183 pg/mL — ABNORMAL LOW (ref 211–911)

## 2019-01-02 ENCOUNTER — Telehealth: Payer: Self-pay | Admitting: Internal Medicine

## 2019-01-02 NOTE — Telephone Encounter (Signed)
Called pt's daughter; reviewed lab results with daughter (per pt. request from previous documentation)  See result note.

## 2019-01-02 NOTE — Telephone Encounter (Signed)
Copied from [place] [phone]. Topic: Quick Communication - Lab Results (Clinic Use ONLY) >> Jan 02, 2019 10:17 AM Lars Masson, LPN wrote: Called patient to inform them of lab results. When patient returns call, triage nurse may disclose results. Pt daughter sharon is not on DPR however trish call pt and she told trish to call her daughter sharon

## 2019-01-09 ENCOUNTER — Ambulatory Visit: Payer: Medicare Other

## 2019-01-14 ENCOUNTER — Ambulatory Visit (INDEPENDENT_AMBULATORY_CARE_PROVIDER_SITE_OTHER): Payer: Medicare Other

## 2019-01-14 DIAGNOSIS — E538 Deficiency of other specified B group vitamins: Secondary | ICD-10-CM | POA: Diagnosis not present

## 2019-01-14 MED ORDER — CYANOCOBALAMIN 1000 MCG/ML IJ SOLN
1000.0000 ug | Freq: Once | INTRAMUSCULAR | Status: AC
  Administered 2019-01-14: 1000 ug via INTRAMUSCULAR

## 2019-01-14 NOTE — Progress Notes (Addendum)
Patient presented for B 12 injection to left deltoid, patient voiced no concerns nor showed any signs of distress during injection.  Reviewed.  Dr Scott 

## 2019-01-23 ENCOUNTER — Ambulatory Visit (INDEPENDENT_AMBULATORY_CARE_PROVIDER_SITE_OTHER): Payer: Medicare Other

## 2019-01-23 DIAGNOSIS — E538 Deficiency of other specified B group vitamins: Secondary | ICD-10-CM | POA: Diagnosis not present

## 2019-01-23 MED ORDER — CYANOCOBALAMIN 1000 MCG/ML IJ SOLN
1000.0000 ug | Freq: Once | INTRAMUSCULAR | Status: AC
  Administered 2019-01-23: 1000 ug via INTRAMUSCULAR

## 2019-01-23 NOTE — Progress Notes (Signed)
Patient came in today for B-12 injection in right deltoid. Patient tolerated well.  

## 2019-01-29 ENCOUNTER — Ambulatory Visit (INDEPENDENT_AMBULATORY_CARE_PROVIDER_SITE_OTHER): Payer: Medicare Other | Admitting: Lab

## 2019-01-29 DIAGNOSIS — E538 Deficiency of other specified B group vitamins: Secondary | ICD-10-CM

## 2019-01-29 MED ORDER — CYANOCOBALAMIN 1000 MCG/ML IJ SOLN
1000.0000 ug | Freq: Once | INTRAMUSCULAR | Status: AC
  Administered 2019-01-29: 1000 ug via INTRAMUSCULAR

## 2019-01-29 NOTE — Progress Notes (Addendum)
Pt here today for VB-12 injection in Left Deltoid. Pt tolerated well.  Signed.  Dr Nicki Reaper

## 2019-02-06 ENCOUNTER — Ambulatory Visit (INDEPENDENT_AMBULATORY_CARE_PROVIDER_SITE_OTHER): Payer: Medicare Other

## 2019-02-06 DIAGNOSIS — E538 Deficiency of other specified B group vitamins: Secondary | ICD-10-CM | POA: Diagnosis not present

## 2019-02-06 MED ORDER — CYANOCOBALAMIN 1000 MCG/ML IJ SOLN
1000.0000 ug | Freq: Once | INTRAMUSCULAR | Status: AC
  Administered 2019-02-06: 1000 ug via INTRAMUSCULAR

## 2019-02-06 NOTE — Progress Notes (Addendum)
Patient presented for B 12 injection to Right deltoid, patient voiced no concerns nor showed any signs of distress during injection  Reviewed.  Please confirm for accuracy as we discussed.    Dr Nicki Reaper  Reviewed.  Dr Nicki Reaper

## 2019-02-27 ENCOUNTER — Other Ambulatory Visit: Payer: Self-pay | Admitting: Internal Medicine

## 2019-03-10 ENCOUNTER — Telehealth: Payer: Self-pay

## 2019-03-10 NOTE — Telephone Encounter (Signed)
Appointment has been canceled. Copied from [place] [phone]. Topic: Appointment Scheduling - Scheduling Inquiry for Clinic >> Mar 10, 2019  2:26 PM Rutherford Nail, [place] wrote: Reason for CRM: Patient daughter, [name], calling and would like to cancel the patient's nurse visit for her B12 injection on 03/12/2019. States that she would just like to get this done at the physical in May. Please advise.

## 2019-03-11 NOTE — Telephone Encounter (Signed)
Patients daughter would rather hold until May

## 2019-03-11 NOTE — Telephone Encounter (Signed)
Ok to hold. Can offer to give in car if desires, but if desires to hold - ok

## 2019-03-11 NOTE — Telephone Encounter (Signed)
Her b12 level was 183 in January. Are you okay with her skipping her April shot and getting it on 5/12 when she comes in?

## 2019-03-12 ENCOUNTER — Ambulatory Visit: Payer: Medicare Other

## 2019-04-15 ENCOUNTER — Ambulatory Visit (INDEPENDENT_AMBULATORY_CARE_PROVIDER_SITE_OTHER): Payer: Medicare Other | Admitting: Internal Medicine

## 2019-04-15 ENCOUNTER — Other Ambulatory Visit: Payer: Self-pay

## 2019-04-15 ENCOUNTER — Encounter: Payer: Self-pay | Admitting: Internal Medicine

## 2019-04-15 DIAGNOSIS — K219 Gastro-esophageal reflux disease without esophagitis: Secondary | ICD-10-CM | POA: Diagnosis not present

## 2019-04-15 DIAGNOSIS — R739 Hyperglycemia, unspecified: Secondary | ICD-10-CM

## 2019-04-15 DIAGNOSIS — E039 Hypothyroidism, unspecified: Secondary | ICD-10-CM | POA: Diagnosis not present

## 2019-04-15 DIAGNOSIS — C8590 Non-Hodgkin lymphoma, unspecified, unspecified site: Secondary | ICD-10-CM | POA: Diagnosis not present

## 2019-04-15 DIAGNOSIS — N2889 Other specified disorders of kidney and ureter: Secondary | ICD-10-CM | POA: Diagnosis not present

## 2019-04-15 DIAGNOSIS — M545 Low back pain: Secondary | ICD-10-CM | POA: Diagnosis not present

## 2019-04-15 DIAGNOSIS — G8929 Other chronic pain: Secondary | ICD-10-CM | POA: Diagnosis not present

## 2019-04-15 DIAGNOSIS — E7800 Pure hypercholesterolemia, unspecified: Secondary | ICD-10-CM | POA: Diagnosis not present

## 2019-04-15 DIAGNOSIS — R1310 Dysphagia, unspecified: Secondary | ICD-10-CM

## 2019-04-15 DIAGNOSIS — I10 Essential (primary) hypertension: Secondary | ICD-10-CM

## 2019-04-15 MED ORDER — PANTOPRAZOLE SODIUM 40 MG PO TBEC: 40.0000 mg | DELAYED_RELEASE_TABLET | Freq: Every day | ORAL | 3 refills | Status: DC

## 2019-04-15 MED ORDER — LEVOTHYROXINE SODIUM 75 MCG PO TABS: 75.0000 ug | ORAL_TABLET | Freq: Every day | ORAL | 3 refills | Status: DC

## 2019-04-15 NOTE — Progress Notes (Signed)
Patient ID: Tasha Avila, female   DOB: 1928-07-09, 85 y.o.   MRN: [id number]   Virtual Visit via Telephone Note  This visit type was conducted due to national recommendations for restrictions regarding the COVID-19 pandemic (e.g. social distancing).  This format is felt to be most appropriate for this patient at this time.  All issues noted in this document were discussed and addressed.  No physical exam was performed (except for noted visual exam findings with Video Visits).   I connected with Tasha Avila today by telephone and verified that I am speaking with the correct person using two identifiers. Location patient: home Location provider: work  Persons participating in the telephone visit: patient, provider and pts daughter.    I discussed the limitations, risks, security and privacy concerns of performing an evaluation and management service by telephone and the availability of in person appointments. . The patient expressed understanding and agreed to proceed.   Reason for visit: scheduled follow up.   HPI: She reports she feels things are stable.  Still with back pain.  taes tramadol, but does not take scheduled.  No chest pain.  No sob.  Staying in secondary to [place] exposure.  No fever.  No chest congestion or sob.  No acid reflux.  No abdominal pain.  Due labs.  Discussed her diagnosis/hisotry of lymphoma.  Had seen Dr Oliva Bustard.  Declined further testing or evaluation.  Discussed with her today.  She continues to decline.     ROS: See pertinent positives and negatives per HPI.  Past Medical History:  Diagnosis Date  . Anemia   . Arthritis   . GERD (gastroesophageal reflux disease)   . Hypercholesterolemia   . Hypertension   . Hypothyroidism     Past Surgical History:  Procedure Laterality Date  . ABDOMINAL HYSTERECTOMY  1960s   bladder tack at the same time, ovaries not removed  . LAPAROSCOPIC CHOLECYSTECTOMY  5/11    Family History  Problem Relation Age of  Onset  . Lymphoma Brother   . Stroke Brother   . Diabetes Brother   . Diabetes Brother        x2  . Stroke Brother   . Thyroid disease Brother   . Stroke Mother     SOCIAL HX: reviewed.    Current Outpatient Medications:  .  acetaminophen (TYLENOL) 500 MG tablet, Take 500 mg by mouth every 6 (six) hours as needed., Disp: , Rfl:  .  Homeopathic Products ([employer] LEG CRAMPS PAIN RELIEF PO), Take by mouth as needed., Disp: , Rfl:  .  hydrochlorothiazide (HYDRODIURIL) 12.5 MG tablet, Take 12.5 mg by mouth daily., Disp: , Rfl: 11 .  ipratropium (ATROVENT) 0.03 % nasal spray, Place 2 sprays into both nostrils every 12 (twelve) hours., Disp: 30 mL, Rfl: 1 .  levothyroxine (SYNTHROID) 75 MCG tablet, Take 1 tablet (75 mcg total) by mouth daily., Disp: 90 tablet, Rfl: 3 .  lisinopril (PRINIVIL,ZESTRIL) 20 MG tablet, Take 1 tablet (20 mg total) by mouth daily., Disp: 30 tablet, Rfl: 2 .  pantoprazole (PROTONIX) 40 MG tablet, Take 1 tablet (40 mg total) by mouth daily., Disp: 90 tablet, Rfl: 3 .  traMADol (ULTRAM) 50 MG tablet, Take 1 tablet (50 mg total) by mouth at bedtime as needed., Disp: 30 tablet, Rfl: 1  EXAM:  GENERAL: alert, oriented. Sounds to be in no acute distress.  Answering questions appropriately.    PSYCH/NEURO: pleasant and cooperative, no obvious depression or anxiety, speech and thought  processing grossly intact  ASSESSMENT AND PLAN:  Discussed the following assessment and plan:  Chronic low back pain without sciatica, unspecified back pain laterality  Dysphagia, unspecified type  Essential (primary) hypertension  Gastro-esophageal reflux disease without esophagitis  Hypercholesterolemia  Hyperglycemia  Hypothyroidism, unspecified type  Lymphoma, unspecified body region, unspecified lymphoma type ([place])  Pure hypercholesterolemia  Renal mass  Back pain S/p sacral fracture and decompression fracture noted on MRI.  Have discussed bone density.  Discussed  referral to dr Sharlet Salina.  Discussed further evaluation and w/up.  She declines.  Wants to monitor.  Has tramadol.    Dysphagia Swallowing ok.  Follow.    Essential (primary) hypertension Continue current medication regimen.  Follow pressures.  Follow metabolic panel.   Gastro-esophageal reflux disease without esophagitis Appears to be doing ok overall.  Continue protonix.  Declines any further testing or evaluation.    Hypercholesterolemia Low cholesterol diet and exercise.  Follow lipid panel.   Hyperglycemia Low carb diet and exercise.  Follow met b and a1c.   Hypothyroidism On thyroid replacement.  Follow tsh.    Lymphoma Saw Dr Oliva Bustard.  Discussed with her today.  She declines further w/up or evaluation.  Follow.    Pure hypercholesterolemia Follow lipid panel.    Renal mass Was evaluated by urology and nephrology.  Declines any further w/up or evaluation.  Follow.      I discussed the assessment and treatment plan with the patient. The patient was provided an opportunity to ask questions and all were answered. The patient agreed with the plan and demonstrated an understanding of the instructions.   The patient was advised to call back or seek an in-person evaluation if the symptoms worsen or if the condition fails to improve as anticipated.  I provided 22 minutes of non-face-to-face time during this encounter.   Einar Pheasant, MD

## 2019-04-19 ENCOUNTER — Encounter: Payer: Self-pay | Admitting: Internal Medicine

## 2019-04-19 NOTE — Assessment & Plan Note (Signed)
Saw Dr Oliva Bustard.  Discussed with her today.  She declines further w/up or evaluation.  Follow.

## 2019-04-19 NOTE — Assessment & Plan Note (Signed)
Appears to be doing ok overall.  Continue protonix.  Declines any further testing or evaluation.

## 2019-04-19 NOTE — Assessment & Plan Note (Signed)
Low cholesterol diet and exercise.  Follow lipid panel.   

## 2019-04-19 NOTE — Assessment & Plan Note (Signed)
On thyroid replacement.  Follow tsh.  

## 2019-04-19 NOTE — Assessment & Plan Note (Signed)
Was evaluated by urology and nephrology.  Declines any further w/up or evaluation.  Follow.

## 2019-04-19 NOTE — Assessment & Plan Note (Signed)
Swallowing ok.  Follow.

## 2019-04-19 NOTE — Assessment & Plan Note (Signed)
Follow lipid panel.   

## 2019-04-19 NOTE — Assessment & Plan Note (Signed)
Continue current medication regimen.  Follow pressures.  Follow metabolic panel.

## 2019-04-19 NOTE — Assessment & Plan Note (Signed)
S/p sacral fracture and decompression fracture noted on MRI.  Have discussed bone density.  Discussed referral to dr Sharlet Salina.  Discussed further evaluation and w/up.  She declines.  Wants to monitor.  Has tramadol.

## 2019-04-19 NOTE — Assessment & Plan Note (Signed)
Low carb diet and exercise.  Follow met b and a1c.  

## 2019-07-02 ENCOUNTER — Other Ambulatory Visit: Payer: Self-pay | Admitting: Internal Medicine

## 2019-07-03 NOTE — Telephone Encounter (Signed)
ok'd for tramadol #30 with one refill.

## 2019-07-03 NOTE — Telephone Encounter (Signed)
Refilled: 08/27/2018 Last OV: 04/15/2019 Next OV: 08/15/2019

## 2019-07-09 DIAGNOSIS — E039 Hypothyroidism, unspecified: Secondary | ICD-10-CM | POA: Diagnosis not present

## 2019-07-09 DIAGNOSIS — I10 Essential (primary) hypertension: Secondary | ICD-10-CM | POA: Diagnosis not present

## 2019-07-09 DIAGNOSIS — K219 Gastro-esophageal reflux disease without esophagitis: Secondary | ICD-10-CM | POA: Diagnosis not present

## 2019-07-09 DIAGNOSIS — E7800 Pure hypercholesterolemia, unspecified: Secondary | ICD-10-CM | POA: Diagnosis not present

## 2019-07-09 DIAGNOSIS — R079 Chest pain, unspecified: Secondary | ICD-10-CM | POA: Diagnosis not present

## 2019-07-09 DIAGNOSIS — C8590 Non-Hodgkin lymphoma, unspecified, unspecified site: Secondary | ICD-10-CM | POA: Diagnosis not present

## 2019-08-15 ENCOUNTER — Ambulatory Visit (INDEPENDENT_AMBULATORY_CARE_PROVIDER_SITE_OTHER): Payer: Medicare Other

## 2019-08-15 ENCOUNTER — Ambulatory Visit (INDEPENDENT_AMBULATORY_CARE_PROVIDER_SITE_OTHER): Payer: Medicare Other | Admitting: Internal Medicine

## 2019-08-15 ENCOUNTER — Other Ambulatory Visit: Payer: Self-pay

## 2019-08-15 DIAGNOSIS — Z0000 Encounter for general adult medical examination without abnormal findings: Secondary | ICD-10-CM | POA: Diagnosis not present

## 2019-08-15 DIAGNOSIS — E039 Hypothyroidism, unspecified: Secondary | ICD-10-CM

## 2019-08-15 DIAGNOSIS — N2889 Other specified disorders of kidney and ureter: Secondary | ICD-10-CM

## 2019-08-15 DIAGNOSIS — K219 Gastro-esophageal reflux disease without esophagitis: Secondary | ICD-10-CM | POA: Diagnosis not present

## 2019-08-15 DIAGNOSIS — I10 Essential (primary) hypertension: Secondary | ICD-10-CM

## 2019-08-15 DIAGNOSIS — M545 Low back pain, unspecified: Secondary | ICD-10-CM

## 2019-08-15 DIAGNOSIS — F32A Depression, unspecified: Secondary | ICD-10-CM

## 2019-08-15 DIAGNOSIS — C8220 Follicular lymphoma grade III, unspecified, unspecified site: Secondary | ICD-10-CM | POA: Diagnosis not present

## 2019-08-15 DIAGNOSIS — M25561 Pain in right knee: Secondary | ICD-10-CM

## 2019-08-15 DIAGNOSIS — F320 Major depressive disorder, single episode, mild: Secondary | ICD-10-CM

## 2019-08-15 DIAGNOSIS — E7800 Pure hypercholesterolemia, unspecified: Secondary | ICD-10-CM | POA: Diagnosis not present

## 2019-08-15 DIAGNOSIS — M25562 Pain in left knee: Secondary | ICD-10-CM

## 2019-08-15 DIAGNOSIS — C8590 Non-Hodgkin lymphoma, unspecified, unspecified site: Secondary | ICD-10-CM | POA: Diagnosis not present

## 2019-08-15 DIAGNOSIS — R739 Hyperglycemia, unspecified: Secondary | ICD-10-CM | POA: Diagnosis not present

## 2019-08-15 DIAGNOSIS — R1084 Generalized abdominal pain: Secondary | ICD-10-CM | POA: Diagnosis not present

## 2019-08-15 DIAGNOSIS — G8929 Other chronic pain: Secondary | ICD-10-CM

## 2019-08-15 MED ORDER — DULOXETINE HCL 30 MG PO CPEP: 30.0000 mg | ORAL_CAPSULE | Freq: Every day | ORAL | 1 refills | Status: DC

## 2019-08-15 MED ORDER — NYSTATIN 100000 UNIT/GM EX CREA: 1.0000 "application " | TOPICAL_CREAM | Freq: Two times a day (BID) | CUTANEOUS | 0 refills | Status: DC

## 2019-08-15 NOTE — Progress Notes (Addendum)
Subjective:   Tasha Avila is a 85 y.o. female who presents for Medicare Annual (Subsequent) preventive examination.  Review of Systems:  No ROS.  Medicare Wellness Virtual Visit.  Visual/audio telehealth visit, UTA vital signs.   See social history for additional risk factors.   Cardiac Risk Factors include: advanced age (>45men, >72 women);hypertension     Objective:     Vitals: There were no vitals taken for this visit.  There is no height or weight on file to calculate BMI.  Advanced Directives 08/08/2018 12/22/2015 06/01/2015  Does Patient Have a Medical Advance Directive? No No No  Would patient like information on creating a medical advance directive? Yes (MAU/Ambulatory/Procedural Areas - Information given) - -    Tobacco Social History   Tobacco Use  Smoking Status Never Smoker  Smokeless Tobacco Never Used     Counseling given: Not Answered   Clinical Intake:  Pre-visit preparation completed: Yes        Diabetes: No  How often do you need to have someone help you when you read instructions, pamphlets, or other written materials from your doctor or pharmacy?: 3 - Sometimes  Interpreter Needed?: No     Past Medical History:  Diagnosis Date  . Anemia   . Arthritis   . GERD (gastroesophageal reflux disease)   . Hypercholesterolemia   . Hypertension   . Hypothyroidism    Past Surgical History:  Procedure Laterality Date  . ABDOMINAL HYSTERECTOMY  1960s   bladder tack at the same time, ovaries not removed  . LAPAROSCOPIC CHOLECYSTECTOMY  5/11   Family History  Problem Relation Age of Onset  . Lymphoma Brother   . Stroke Brother   . Diabetes Brother   . Diabetes Brother        x2  . Stroke Brother   . Thyroid disease Brother   . Stroke Mother    Social History   Socioeconomic History  . Marital status: Widowed    Spouse name: Not on file  . Number of children: 2  . Years of education: Not on file  . Highest education level:  Not on file  Occupational History  . Not on file  Social Needs  . Financial resource strain: Not hard at all  . Food insecurity    Worry: Never true    Inability: Never true  . Transportation needs    Medical: No    Non-medical: No  Tobacco Use  . Smoking status: Never Smoker  . Smokeless tobacco: Never Used  Substance and Sexual Activity  . Alcohol use: No    Alcohol/week: 0.0 standard drinks  . Drug use: No  . Sexual activity: Not on file  Lifestyle  . Physical activity    Days per week: 0 days    Minutes per session: Not on file  . Stress: Not on file  Relationships  . Social Herbalist on phone: Not on file    Gets together: Not on file    Attends religious service: Not on file    Active member of club or organization: Not on file    Attends meetings of clubs or organizations: Not on file    Relationship status: Widowed  Other Topics Concern  . Not on file  Social History Narrative  . Not on file    Outpatient Encounter Medications as of 08/15/2019  Medication Sig  . acetaminophen (TYLENOL) 500 MG tablet Take 500 mg by mouth every 6 (six) hours  as needed.  . Homeopathic Products ([employer] LEG CRAMPS PAIN RELIEF PO) Take by mouth as needed.  . hydrochlorothiazide (HYDRODIURIL) 12.5 MG tablet Take 12.5 mg by mouth daily.  Marland Kitchen ipratropium (ATROVENT) 0.03 % nasal spray Place 2 sprays into both nostrils every 12 (twelve) hours.  Marland Kitchen levothyroxine (SYNTHROID) 75 MCG tablet Take 1 tablet (75 mcg total) by mouth daily.  Marland Kitchen lisinopril (PRINIVIL,ZESTRIL) 20 MG tablet Take 1 tablet (20 mg total) by mouth daily.  . pantoprazole (PROTONIX) 40 MG tablet Take 1 tablet (40 mg total) by mouth daily.  . traMADol (ULTRAM) 50 MG tablet TAKE 1 TABLET BY MOUTH AT BEDTIME AS NEEDED   No facility-administered encounter medications on file as of 08/15/2019.     Activities of Daily Living In your present state of health, do you have any difficulty performing the following activities:  08/15/2019  Hearing? Y  Vision? N  Difficulty concentrating or making decisions? Y  Walking or climbing stairs? Y  Comment Unsteady gait  Dressing or bathing? N  Doing errands, shopping? Y  Comment She does not Physiological scientist and eating ? Y  Comment Daughter assists  Using the Toilet? N  In the past six months, have you accidently leaked urine? Y  Comment Managed with daily pad  Do you have problems with loss of bowel control? N  Managing your Medications? Y  Comment Daughter assists as needed  Managing your Finances? Y  Comment Daughter assists as needed  Housekeeping or managing your Housekeeping? Y  Comment Daughter assists as needed  Some recent data might be hidden    Patient Care Team: Einar Pheasant, MD as PCP - General (Internal Medicine) Einar Pheasant, MD (Internal Medicine)    Assessment:   This is a routine wellness examination for Tasha Avila.  I connected with patient 08/15/19 at 10:30 AM EDT by an audio enabled telemedicine application and verified that I am speaking with the correct person using two identifiers. Patient stated full name and DOB. Patient gave permission to continue with virtual visit. Patient's location was at home and Nurse's location was at [place] office.   Health Maintenance Due:   See completed HM at the end of note.   Eye: Visual acuity not assessed. Virtual visit. Wears corrective lenses.   Dental: UTD  Hearing: Hearing aids  Safety:  Patient feels safe at home- yes Patient does have smoke detectors at home- yes Patient does wear sunscreen or protective clothing when in direct sunlight - yes Patient does wear seat belt when in a moving vehicle - yes Patient drives- no Adequate lighting in walkways free from debris- yes Grab bars and handrails used as appropriate- yes Ambulates with an assistive device- yes Cell phone or lifeline/life alert/medic alert on person when ambulating outside of the home- yes  Social: Alcohol  intake - no  Smokers in home?  none Illicit drug use? none  Depression: PHQ 2 &9 complete. See screening below. Denies irritability, anhedonia, sadness/tearfullness.  Stable.   Falls: See screening below.    Medication: Taking as directed and without issues.   Covid-19: Precautions and sickness symptoms discussed. Wears mask, social distancing, hand hygiene as appropriate.   Activities of Daily Living Daughter assists with household chores, money management, medication management as needed.  Memory: Patient is alert.  BMI- discussed the importance of a healthy diet, water intake and the benefits of aerobic exercise.  Educational material provided.  Physical activity- no routine  Diet: regular diet Water: fair intake  Advanced Directive: End of life planning; Advance aging; Advanced directives discussed.  Copy of current HCPOA/Living Will requested.    Other Providers Patient Care Team: Einar Pheasant, MD as PCP - General (Internal Medicine) Einar Pheasant, MD (Internal Medicine)  Exercise Activities and Dietary recommendations Current Exercise Habits: The patient does not participate in regular exercise at present, Intensity: Mild  Goals    . Maintain Healthy Lifestyle        Fall Risk Fall Risk  08/15/2019 08/08/2018 05/14/2018 03/27/2017 03/19/2015  Falls in the past year? 0 No No No Yes  Number falls in past yr: - - - - 1  Injury with Fall? - - - - No  Risk for fall due to : - - - History of fall(s) -  Risk for fall due to: Comment - - - pt uses cain  -   Depression Screen PHQ 2/9 Scores 08/15/2019 08/08/2018 03/27/2017 03/27/2017  PHQ - 2 Score 0 1 0 0  PHQ- 9 Score - - 4 -     Cognitive Function     6CIT Screen 08/08/2018  What Year? 0 points  What month? 0 points  What time? 0 points  Count back from 20 0 points  Months in reverse 2 points  Repeat phrase 2 points  Total Score 4    Immunization History  Administered Date(s) Administered  . Tdap  03/28/2018   Screening Tests Health Maintenance  Topic Date Due  . DEXA SCAN  03/18/2025 (Originally 09/24/1995)  . TETANUS/TDAP  03/28/2028  . INFLUENZA VACCINE  Discontinued  . PNA vac Low Risk Adult  Discontinued        Plan:    Keep all routine maintenance appointments.   Follow up today with your doctor  Medicare Attestation I have personally reviewed: The patient's medical and social history Their use of alcohol, tobacco or illicit drugs Their current medications and supplements The patient's functional ability including ADLs,fall risks, home safety risks, cognitive, and hearing and visual impairment Diet and physical activities Evidence for depression   In addition, I have reviewed and discussed with patient certain preventive protocols, quality metrics, and best practice recommendations. A written personalized care plan for preventive services as well as general preventive health recommendations were provided to patient via mail.     Varney Biles, LPN  [id number]   Reviewed above information.  Agree with assessment and plan.    Dr Nicki Reaper

## 2019-08-15 NOTE — Patient Instructions (Addendum)
  Tasha Avila , Thank you for taking time to come for your Medicare Wellness Visit. I appreciate your ongoing commitment to your health goals. Please review the following plan we discussed and let me know if I can assist you in the future.   These are the goals we discussed: Goals    . Maintain Healthy Lifestyle        This is a list of the screening recommended for you and due dates:  Health Maintenance  Topic Date Due  . DEXA scan (bone density measurement)  03/18/2025*  . Tetanus Vaccine  03/28/2028  . Flu Shot  Discontinued  . Pneumonia vaccines  Discontinued  *Topic was postponed. The date shown is not the original due date.
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
## 2019-08-15 NOTE — Patient Instructions (Signed)
Stop tramadol  Start cymbalta 30mg  - one per day

## 2019-08-17 ENCOUNTER — Encounter: Payer: Self-pay | Admitting: Internal Medicine

## 2019-08-17 DIAGNOSIS — F320 Major depressive disorder, single episode, mild: Secondary | ICD-10-CM | POA: Insufficient documentation

## 2019-08-17 DIAGNOSIS — M25569 Pain in unspecified knee: Secondary | ICD-10-CM | POA: Insufficient documentation

## 2019-08-17 DIAGNOSIS — F32A Depression, unspecified: Secondary | ICD-10-CM | POA: Insufficient documentation

## 2019-08-17 DIAGNOSIS — R109 Unspecified abdominal pain: Secondary | ICD-10-CM | POA: Insufficient documentation

## 2019-08-17 NOTE — Assessment & Plan Note (Signed)
Has previously been evaluated by nephrology and urology.  Again discussed with her and her daughter today.  She declines further w/up.

## 2019-08-17 NOTE — Assessment & Plan Note (Signed)
S/p previous sacral fracture and compression fracture noted on MRI.  Have discussed bone density.  Discussed referral to Dr Sharlet Salina.  Discussed further evaluation and w/up.  She has declined and continues to decline.  Back seems to be doing better.  Follow.

## 2019-08-17 NOTE — Assessment & Plan Note (Signed)
Previously saw oncology.  I again discussed with her today.  She has declined further evaluation, w/up or testing.  Discussed f/u and further testing today.  She continues to decline.

## 2019-08-17 NOTE — Assessment & Plan Note (Signed)
Blood pressure a little elevated today.  She will spot check her pressures.  Start cymbalta to see if helps with the stress, depression and pain.  Get her back in soon to reassess.  Continue current medication regimen for now.

## 2019-08-17 NOTE — Assessment & Plan Note (Signed)
Appears to be controlled on current regimen.   

## 2019-08-17 NOTE — Assessment & Plan Note (Signed)
Start cymbalta.  Follow closely.  Get her back in soon to reassess.  Hopefully will help some with her pain.  Stop tramadol.  Tylenol as directed.

## 2019-08-17 NOTE — Assessment & Plan Note (Signed)
Low carb diet and exercise.  Follow met b and a1c.  

## 2019-08-17 NOTE — Assessment & Plan Note (Signed)
Reports knee pain and various pains.  Takes tylenol.  Rarely takes tramadol.  Stop tramadol.  Start cymbalta 30mg  q day.  Start low dose.  Confirm tolerates.  Increase as needed.

## 2019-08-17 NOTE — Assessment & Plan Note (Signed)
Has been evaluated by oncology.  Declines further evaluation, testing, w/up.  Discussed again with pt and her daughter.  She continues to decline.

## 2019-08-17 NOTE — Assessment & Plan Note (Signed)
On thyroid replacement.  Follow tsh.  

## 2019-08-17 NOTE — Assessment & Plan Note (Signed)
Low cholesterol diet and exercise.  Follow lipid panel.   

## 2019-08-17 NOTE — Assessment & Plan Note (Signed)
Minimal abdominal discomfort noted on exam.  Has the history of lymphoma and a kidney mass as outlined.  Discussed f/u and scanning and further testing.  She declines.

## 2019-09-26 ENCOUNTER — Other Ambulatory Visit: Payer: Self-pay

## 2019-09-26 ENCOUNTER — Encounter: Payer: Self-pay | Admitting: Internal Medicine

## 2019-09-26 ENCOUNTER — Ambulatory Visit (INDEPENDENT_AMBULATORY_CARE_PROVIDER_SITE_OTHER): Payer: Medicare Other | Admitting: Internal Medicine

## 2019-09-26 VITALS — BP 144/72 | HR 65 | Temp 97.0°F | Resp 16 | Wt 140.0 lb

## 2019-09-26 DIAGNOSIS — F32A Depression, unspecified: Secondary | ICD-10-CM

## 2019-09-26 DIAGNOSIS — K219 Gastro-esophageal reflux disease without esophagitis: Secondary | ICD-10-CM | POA: Diagnosis not present

## 2019-09-26 DIAGNOSIS — Z87440 Personal history of urinary (tract) infections: Secondary | ICD-10-CM

## 2019-09-26 DIAGNOSIS — M545 Low back pain, unspecified: Secondary | ICD-10-CM

## 2019-09-26 DIAGNOSIS — E039 Hypothyroidism, unspecified: Secondary | ICD-10-CM

## 2019-09-26 DIAGNOSIS — R1084 Generalized abdominal pain: Secondary | ICD-10-CM

## 2019-09-26 DIAGNOSIS — D649 Anemia, unspecified: Secondary | ICD-10-CM

## 2019-09-26 DIAGNOSIS — E538 Deficiency of other specified B group vitamins: Secondary | ICD-10-CM

## 2019-09-26 DIAGNOSIS — I10 Essential (primary) hypertension: Secondary | ICD-10-CM

## 2019-09-26 DIAGNOSIS — H6123 Impacted cerumen, bilateral: Secondary | ICD-10-CM

## 2019-09-26 DIAGNOSIS — N2889 Other specified disorders of kidney and ureter: Secondary | ICD-10-CM

## 2019-09-26 DIAGNOSIS — F320 Major depressive disorder, single episode, mild: Secondary | ICD-10-CM

## 2019-09-26 DIAGNOSIS — R739 Hyperglycemia, unspecified: Secondary | ICD-10-CM

## 2019-09-26 DIAGNOSIS — E7800 Pure hypercholesterolemia, unspecified: Secondary | ICD-10-CM

## 2019-09-26 DIAGNOSIS — C8590 Non-Hodgkin lymphoma, unspecified, unspecified site: Secondary | ICD-10-CM | POA: Diagnosis not present

## 2019-09-26 MED ORDER — DULOXETINE HCL 60 MG PO CPEP: 60.0000 mg | ORAL_CAPSULE | Freq: Every day | ORAL | 2 refills | Status: DC

## 2019-09-26 MED ORDER — LISINOPRIL 40 MG PO TABS: 40.0000 mg | ORAL_TABLET | Freq: Every day | ORAL | 3 refills | Status: DC
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
## 2019-09-27 LAB — HEPATIC FUNCTION PANEL
AG Ratio: 1.9 (calc) (ref 1.0–2.5)
ALT: 7 U/L (ref 6–29)
AST: 12 U/L (ref 10–35)
Albumin: 4.1 g/dL (ref 3.6–5.1)
Alkaline phosphatase (APISO): 36 U/L — ABNORMAL LOW (ref 37–153)
Bilirubin, Direct: 0.1 mg/dL (ref 0.0–0.2)
Globulin: 2.2 g/dL (calc) (ref 1.9–3.7)
Indirect Bilirubin: 0.2 mg/dL (calc) (ref 0.2–1.2)
Total Bilirubin: 0.3 mg/dL (ref 0.2–1.2)
Total Protein: 6.3 g/dL (ref 6.1–8.1)

## 2019-09-27 LAB — BASIC METABOLIC PANEL
BUN: 11 mg/dL (ref 7–25)
CO2: 24 mmol/L (ref 20–32)
Calcium: 8.9 mg/dL (ref 8.6–10.4)
Chloride: 103 mmol/L (ref 98–110)
Creat: 0.82 mg/dL (ref 0.60–0.88)
Glucose, Bld: 95 mg/dL (ref 65–99)
Potassium: 4.6 mmol/L (ref 3.5–5.3)
Sodium: 137 mmol/L (ref 135–146)

## 2019-09-27 LAB — CBC WITH DIFFERENTIAL/PLATELET
Absolute Monocytes: 784 cells/uL (ref 200–950)
Basophils Absolute: 52 cells/uL (ref 0–200)
Basophils Relative: 0.7 %
Eosinophils Absolute: 67 cells/uL (ref 15–500)
Eosinophils Relative: 0.9 %
HCT: 37 % (ref 35.0–45.0)
Hemoglobin: 12.6 g/dL (ref 11.7–15.5)
Lymphs Abs: 1576 cells/uL (ref 850–3900)
MCH: 32.5 pg (ref 27.0–33.0)
MCHC: 34.1 g/dL (ref 32.0–36.0)
MCV: 95.4 fL (ref 80.0–100.0)
MPV: 10 fL (ref 7.5–12.5)
Monocytes Relative: 10.6 %
Neutro Abs: 4921 cells/uL (ref 1500–7800)
Neutrophils Relative %: 66.5 %
Platelets: 336 10*3/uL (ref 140–400)
RBC: 3.88 10*6/uL (ref 3.80–5.10)
RDW: 12 % (ref 11.0–15.0)
Total Lymphocyte: 21.3 %
WBC: 7.4 10*3/uL (ref 3.8–10.8)

## 2019-09-27 LAB — VITAMIN B12: Vitamin B-12: 360 pg/mL (ref 200–1100)

## 2019-09-27 LAB — HEMOGLOBIN A1C
Hgb A1c MFr Bld: 5.5 % of total Hgb (ref <5.7)
Mean Plasma Glucose: 111 (calc)
eAG (mmol/L): 6.2 (calc)

## 2019-09-27 LAB — TSH: TSH: 0.81 mIU/L (ref 0.40–4.50)

## 2019-09-27 LAB — FERRITIN: Ferritin: 61 ng/mL (ref 16–288)

## 2019-09-28 LAB — URINALYSIS, ROUTINE W REFLEX MICROSCOPIC
Bilirubin Urine: NEGATIVE
Glucose, UA: NEGATIVE
Hgb urine dipstick: NEGATIVE
Hyaline Cast: NONE SEEN /LPF
Ketones, ur: NEGATIVE
Nitrite: NEGATIVE
Protein, ur: NEGATIVE
Specific Gravity, Urine: 1.011 (ref 1.001–1.03)
pH: 6 (ref 5.0–8.0)

## 2019-09-28 LAB — URINE CULTURE
MICRO NUMBER:: 1023889
SPECIMEN QUALITY:: ADEQUATE

## 2019-09-29 ENCOUNTER — Other Ambulatory Visit: Payer: Self-pay

## 2019-09-29 MED ORDER — CEFDINIR 300 MG PO CAPS: 300.0000 mg | ORAL_CAPSULE | Freq: Two times a day (BID) | ORAL | 0 refills | Status: DC

## 2019-10-04 ENCOUNTER — Encounter: Payer: Self-pay | Admitting: Internal Medicine

## 2019-10-04 NOTE — Assessment & Plan Note (Signed)
Previously saw oncology.  I again discussed with her regarding f/u scanning and evaluation.  She declines.

## 2019-10-04 NOTE — Assessment & Plan Note (Signed)
On protonix.  Desires no further intervention.  Follow.

## 2019-10-04 NOTE — Assessment & Plan Note (Signed)
Check cbc today 

## 2019-10-04 NOTE — Assessment & Plan Note (Signed)
S/p previous sacral fracture and compression fracture noted on MRI.  Have discussed bone density.  Discussed referral to Dr Sharlet Salina for pain control.  Discussed further testing, evaluation and w/up.  She declines.  Follow.

## 2019-10-04 NOTE — Assessment & Plan Note (Signed)
Has hearing aids.  Will need ENT to clean out ears.  Discussed with her today.

## 2019-10-04 NOTE — Assessment & Plan Note (Signed)
Increase cymbalta to 60mg  q day.  Follow.

## 2019-10-04 NOTE — Assessment & Plan Note (Signed)
Previously evaluated by nephrology and urology.  Discussed further w/up and evaluation today.  She declines.

## 2019-10-04 NOTE — Assessment & Plan Note (Signed)
Recheck B12 level today

## 2019-10-04 NOTE — Assessment & Plan Note (Signed)
Lisinopril 40mg  q day.  On hctz.  Follow.

## 2019-10-04 NOTE — Assessment & Plan Note (Signed)
On thyroid replacement.  Follow tsh.  

## 2019-10-04 NOTE — Assessment & Plan Note (Signed)
Low carb diet and exercise.  Follow met b and a1c.  

## 2019-10-04 NOTE — Assessment & Plan Note (Signed)
Low cholesterol diet and exercise.  Follow lipid panel.   

## 2019-10-04 NOTE — Assessment & Plan Note (Signed)
Minimal abdominal discomfort as outlined.  Discussed with her today.  History of kidney mass and lymphoma.  Discussed f/u.  Discussed evaluation and scanning.  She declines.

## 2019-11-20 ENCOUNTER — Telehealth: Payer: Self-pay | Admitting: Internal Medicine

## 2019-11-20 NOTE — Telephone Encounter (Signed)
Pt needs her levothyroxine (SYNTHROID) 75 MCG tablet refilled, only has 2 left.

## 2019-11-21 NOTE — Telephone Encounter (Signed)
Ok given to pharmacist to Advertising copywriter.

## 2019-12-16 ENCOUNTER — Ambulatory Visit (INDEPENDENT_AMBULATORY_CARE_PROVIDER_SITE_OTHER): Payer: Medicare Other | Admitting: Internal Medicine

## 2019-12-16 ENCOUNTER — Other Ambulatory Visit: Payer: Self-pay

## 2019-12-16 ENCOUNTER — Encounter: Payer: Self-pay | Admitting: Internal Medicine

## 2019-12-16 DIAGNOSIS — C8590 Non-Hodgkin lymphoma, unspecified, unspecified site: Secondary | ICD-10-CM | POA: Diagnosis not present

## 2019-12-16 DIAGNOSIS — N2889 Other specified disorders of kidney and ureter: Secondary | ICD-10-CM | POA: Diagnosis not present

## 2019-12-16 DIAGNOSIS — E7800 Pure hypercholesterolemia, unspecified: Secondary | ICD-10-CM | POA: Diagnosis not present

## 2019-12-16 DIAGNOSIS — R519 Headache, unspecified: Secondary | ICD-10-CM

## 2019-12-16 DIAGNOSIS — R739 Hyperglycemia, unspecified: Secondary | ICD-10-CM | POA: Diagnosis not present

## 2019-12-16 DIAGNOSIS — F32A Depression, unspecified: Secondary | ICD-10-CM

## 2019-12-16 DIAGNOSIS — D649 Anemia, unspecified: Secondary | ICD-10-CM

## 2019-12-16 DIAGNOSIS — G8929 Other chronic pain: Secondary | ICD-10-CM

## 2019-12-16 DIAGNOSIS — R1084 Generalized abdominal pain: Secondary | ICD-10-CM

## 2019-12-16 DIAGNOSIS — M545 Low back pain: Secondary | ICD-10-CM | POA: Diagnosis not present

## 2019-12-16 DIAGNOSIS — K219 Gastro-esophageal reflux disease without esophagitis: Secondary | ICD-10-CM | POA: Diagnosis not present

## 2019-12-16 DIAGNOSIS — F320 Major depressive disorder, single episode, mild: Secondary | ICD-10-CM

## 2019-12-16 DIAGNOSIS — I10 Essential (primary) hypertension: Secondary | ICD-10-CM

## 2019-12-16 DIAGNOSIS — E039 Hypothyroidism, unspecified: Secondary | ICD-10-CM

## 2019-12-16 DIAGNOSIS — S32010D Wedge compression fracture of first lumbar vertebra, subsequent encounter for fracture with routine healing: Secondary | ICD-10-CM

## 2019-12-16 MED ORDER — LISINOPRIL 40 MG PO TABS: 40.0000 mg | ORAL_TABLET | Freq: Every day | ORAL | 3 refills | Status: DC

## 2019-12-16 MED ORDER — DULOXETINE HCL 60 MG PO CPEP: 60.0000 mg | ORAL_CAPSULE | Freq: Every day | ORAL | 2 refills | Status: DC

## 2019-12-16 NOTE — Progress Notes (Signed)
Patient ID: Tasha Avila, female   DOB: [birth year], 86 y.o.   MRN: [id number]   Virtual Visit via telephone Note  This visit type was conducted due to national recommendations for restrictions regarding the COVID-19 pandemic (e.g. social distancing).  This format is felt to be most appropriate for this patient at this time.  All issues noted in this document were discussed and addressed.  No physical exam was performed (except for noted visual exam findings with Video Visits).   I connected with Tasha Avila by telephone and verified that I am speaking with the correct person using two identifiers. Location patient: home Location provider: work Persons participating in the telephone visit: patient, provider and pts daughter Tasha Avila  I discussed the limitations, risks, security and privacy concerns of performing an evaluation and management service by telephone and the availability of in person appointments. The patient expressed understanding and agreed to proceed.   Reason for visit: scheduled follow up.   HPI: She reports not feeling well.  Report noticing some nausea after eating.  No vomiting.  Some abdominal discomfort.  Bowels are moving.  She is eating.  Also reports headache.  Intermittent.  Hard for her to describe.  Does not appear to be unilateral.  No sinus congestion.  Some runny nose.  Not sleeping as well.  Taking tylenol.  Helps.  Daughter reports that Tasha Avila reads a lot.  Questioning if eye strain could be contributing.  No chest pain.  Breathing stable.  No increased cough or congestion.  Taking protonix for acid reflux.  Still will notice some occasionally.  Not sure how her blood pressure is doing.  Last visit, increased lisinopril to 41m q day.  Back pain - chronic issue for her.  Taking cymbalta.     ROS: See pertinent positives and negatives per HPI.  Past Medical History:  Diagnosis Date  . Anemia   . Arthritis   . GERD (gastroesophageal reflux disease)   .  Hypercholesterolemia   . Hypertension   . Hypothyroidism     Past Surgical History:  Procedure Laterality Date  . ABDOMINAL HYSTERECTOMY  1960s   bladder tack at the same time, ovaries not removed  . LAPAROSCOPIC CHOLECYSTECTOMY  5/11    Family History  Problem Relation Age of Onset  . Lymphoma Brother   . Stroke Brother   . Diabetes Brother   . Diabetes Brother        x2  . Stroke Brother   . Thyroid disease Brother   . Stroke Mother     SOCIAL HX: reviewed.    Current Outpatient Medications:  .  acetaminophen (TYLENOL) 500 MG tablet, Take 500 mg by mouth every 6 (six) hours as needed., Disp: , Rfl:  .  DULoxetine (CYMBALTA) 60 MG capsule, Take 1 capsule (60 mg total) by mouth daily., Disp: 30 capsule, Rfl: 2 .  Homeopathic Products ([employer] LEG CRAMPS PAIN RELIEF PO), Take by mouth as needed., Disp: , Rfl:  .  ipratropium (ATROVENT) 0.03 % nasal spray, Place 2 sprays into both nostrils every 12 (twelve) hours., Disp: 30 mL, Rfl: 1 .  levothyroxine (SYNTHROID) 75 MCG tablet, Take 1 tablet (75 mcg total) by mouth daily., Disp: 90 tablet, Rfl: 3 .  lisinopril (ZESTRIL) 40 MG tablet, Take 1 tablet (40 mg total) by mouth daily., Disp: 30 tablet, Rfl: 3 .  nystatin cream (MYCOSTATIN), Apply 1 application topically 2 (two) times daily., Disp: 60 g, Rfl: 0 .  pantoprazole (PROTONIX) 40  MG tablet, Take 1 tablet (40 mg total) by mouth daily., Disp: 90 tablet, Rfl: 3  EXAM:  GENERAL: alert.  Sounds to be in no acute distress.  Answering questions appropriately.   PSYCH/NEURO: pleasant and cooperative, no obvious depression or anxiety, speech and thought processing grossly intact  ASSESSMENT AND PLAN:  Discussed the following assessment and plan:  Abdominal pain Abdominal discomfort and nausea as outlined.  Agreed to f/u labs.  Discussed further scanning.  She declines.  Has a history of kidney mass and lymphoma.  Discussed f/u scanning, etc - she declines.  Continue protonix daily.     Anemia Recheck cbc with next labs.   Back pain S/p previous sacral fracture and compression fracture noted on MRI.  Have discussed bone density.  She declines.  Discussed referral to Dr Sharlet Salina or pain clinic for pain control.  She declines.  Declines any further testing or evaluation.  Compression fracture of lumbar vertebra (HCC) Declines bone density.  Declines further w/up and evaluation.    Essential (primary) hypertension Lisinopril increased to 34m q day last visit.  Continue current medication regimen.  Follow pressures.  Follow metabolic panel.    Gastro-esophageal reflux disease without esophagitis Continue protonix daily.  Follow.    Hypercholesterolemia Low cholesterol diet and exercise.  Follow lipid panel.   Hyperglycemia Follow met b and a1c.   Hypothyroidism On thyroid replacement.  Follow tsh.   Lymphoma Previously saw oncology.  I again discussed with her regarding f/u and/or f/u scanning.  She declines.    Mild depression [place] On cymbalta.  Follow.  Stable.   Renal mass Previously evaluated by nephrology and urology.  Discussed further w/up and evaluation.  She declines.    Headache Persistent intermittent headache.  No sinus congestion.  Reads a lot.  Discussed eye strain.  Discussed need for f/u eye exams.  Unclear etiology. Check esr.  Discussed head scan.  She declines.  Declines any further w/up.     Orders Placed This Encounter  Procedures  . CBC with Differential/Platelet    Standing Status:   Future    Standing Expiration Date:   12/19/2020  . Hemoglobin A1c    Standing Status:   Future    Standing Expiration Date:   12/19/2020  . Hepatic function panel    Standing Status:   Future    Standing Expiration Date:   12/19/2020  . Lipid panel    Standing Status:   Future    Standing Expiration Date:   12/19/2020  . Basic metabolic panel (future)    Standing Status:   Future    Standing Expiration Date:   12/19/2020  . Sed Rate (ESR)     Standing Status:   Future    Standing Expiration Date:   12/19/2020    Meds ordered this encounter  Medications  . DULoxetine (CYMBALTA) 60 MG capsule    Sig: Take 1 capsule (60 mg total) by mouth daily.    Dispense:  30 capsule    Refill:  2  . lisinopril (ZESTRIL) 40 MG tablet    Sig: Take 1 tablet (40 mg total) by mouth daily.    Dispense:  30 tablet    Refill:  3     I discussed the assessment and treatment plan with the patient. The patient was provided an opportunity to ask questions and all were answered. The patient agreed with the plan and demonstrated an understanding of the instructions.   The patient was advised to  call back or seek an in-person evaluation if the symptoms worsen or if the condition fails to improve as anticipated.  I provided 25 minutes of non-face-to-face time during this encounter.   Einar Pheasant, MD

## 2019-12-18 ENCOUNTER — Other Ambulatory Visit: Payer: Self-pay

## 2019-12-19 ENCOUNTER — Telehealth: Payer: Self-pay | Admitting: *Deleted

## 2019-12-19 NOTE — Telephone Encounter (Signed)
Please place future orders for lab appt.  

## 2019-12-20 ENCOUNTER — Encounter: Payer: Self-pay | Admitting: Internal Medicine

## 2019-12-20 DIAGNOSIS — R519 Headache, unspecified: Secondary | ICD-10-CM | POA: Insufficient documentation

## 2019-12-20 NOTE — Assessment & Plan Note (Signed)
Recheck cbc with next labs.   

## 2019-12-20 NOTE — Assessment & Plan Note (Signed)
Continue protonix daily.  Follow.

## 2019-12-20 NOTE — Assessment & Plan Note (Signed)
Abdominal discomfort and nausea as outlined.  Agreed to f/u labs.  Discussed further scanning.  She declines.  Has a history of kidney mass and lymphoma.  Discussed f/u scanning, etc - she declines.  Continue protonix daily.

## 2019-12-20 NOTE — Assessment & Plan Note (Signed)
On cymbalta.  Follow.  Stable.

## 2019-12-20 NOTE — Assessment & Plan Note (Signed)
Low cholesterol diet and exercise.  Follow lipid panel.   

## 2019-12-20 NOTE — Assessment & Plan Note (Signed)
On thyroid replacement.  Follow tsh.  

## 2019-12-20 NOTE — Assessment & Plan Note (Signed)
Persistent intermittent headache.  No sinus congestion.  Reads a lot.  Discussed eye strain.  Discussed need for f/u eye exams.  Unclear etiology. Check esr.  Discussed head scan.  She declines.  Declines any further w/up.

## 2019-12-20 NOTE — Assessment & Plan Note (Signed)
Previously saw oncology.  I again discussed with her regarding f/u and/or f/u scanning.  She declines.

## 2019-12-20 NOTE — Assessment & Plan Note (Signed)
Lisinopril increased to 40mg  q day last visit.  Continue current medication regimen.  Follow pressures.  Follow metabolic panel.

## 2019-12-20 NOTE — Assessment & Plan Note (Signed)
Declines bone density.  Declines further w/up and evaluation.

## 2019-12-20 NOTE — Assessment & Plan Note (Signed)
Follow met b and a1c.  

## 2019-12-20 NOTE — Telephone Encounter (Signed)
I have placed orders for labs.  In reviewing, it has not been quite 3 months since she had a1c.  If wants this drawn, will need to change lab to next week. (so more than 3 months).  Thanks    Dr Nicki Reaper

## 2019-12-20 NOTE — Assessment & Plan Note (Signed)
S/p previous sacral fracture and compression fracture noted on MRI.  Have discussed bone density.  She declines.  Discussed referral to Dr Sharlet Salina or pain clinic for pain control.  She declines.  Declines any further testing or evaluation.

## 2019-12-20 NOTE — Assessment & Plan Note (Signed)
Previously evaluated by nephrology and urology.  Discussed further w/up and evaluation.  She declines.

## 2019-12-22 NOTE — Telephone Encounter (Signed)
Lab appt moved out to next week.

## 2019-12-23 ENCOUNTER — Other Ambulatory Visit: Payer: Medicare Other

## 2019-12-30 ENCOUNTER — Other Ambulatory Visit: Payer: Self-pay

## 2020-01-01 ENCOUNTER — Other Ambulatory Visit (INDEPENDENT_AMBULATORY_CARE_PROVIDER_SITE_OTHER): Payer: Medicare Other

## 2020-01-01 ENCOUNTER — Other Ambulatory Visit: Payer: Self-pay

## 2020-01-01 DIAGNOSIS — R519 Headache, unspecified: Secondary | ICD-10-CM | POA: Diagnosis not present

## 2020-01-01 DIAGNOSIS — D649 Anemia, unspecified: Secondary | ICD-10-CM | POA: Diagnosis not present

## 2020-01-01 DIAGNOSIS — E7800 Pure hypercholesterolemia, unspecified: Secondary | ICD-10-CM

## 2020-01-01 DIAGNOSIS — I10 Essential (primary) hypertension: Secondary | ICD-10-CM | POA: Diagnosis not present

## 2020-01-01 DIAGNOSIS — R739 Hyperglycemia, unspecified: Secondary | ICD-10-CM

## 2020-01-01 LAB — CBC WITH DIFFERENTIAL/PLATELET
Basophils Absolute: 0.1 10*3/uL (ref 0.0–0.1)
Basophils Relative: 1 % (ref 0.0–3.0)
Eosinophils Absolute: 0.1 10*3/uL (ref 0.0–0.7)
Eosinophils Relative: 2 % (ref 0.0–5.0)
HCT: 38.9 % (ref 36.0–46.0)
Hemoglobin: 12.6 g/dL (ref 12.0–15.0)
Lymphocytes Relative: 22.4 % (ref 12.0–46.0)
Lymphs Abs: 1.4 10*3/uL (ref 0.7–4.0)
MCHC: 32.5 g/dL (ref 30.0–36.0)
MCV: 97.2 fl (ref 78.0–100.0)
Monocytes Absolute: 0.7 10*3/uL (ref 0.1–1.0)
Monocytes Relative: 11.1 % (ref 3.0–12.0)
Neutro Abs: 3.9 10*3/uL (ref 1.4–7.7)
Neutrophils Relative %: 63.5 % (ref 43.0–77.0)
Platelets: 306 10*3/uL (ref 150.0–400.0)
RBC: 4 Mil/uL (ref 3.87–5.11)
RDW: 13 % (ref 11.5–15.5)
WBC: 6.2 10*3/uL (ref 4.0–10.5)

## 2020-01-01 LAB — HEPATIC FUNCTION PANEL
ALT: 9 U/L (ref 0–35)
AST: 14 U/L (ref 0–37)
Albumin: 4.3 g/dL (ref 3.5–5.2)
Alkaline Phosphatase: 40 U/L (ref 39–117)
Bilirubin, Direct: 0.1 mg/dL (ref 0.0–0.3)
Total Bilirubin: 0.5 mg/dL (ref 0.2–1.2)
Total Protein: 6.8 g/dL (ref 6.0–8.3)

## 2020-01-01 LAB — LIPID PANEL
Cholesterol: 234 mg/dL — ABNORMAL HIGH (ref 0–200)
HDL: 71.7 mg/dL (ref >39.00)
LDL Cholesterol: 141 mg/dL — ABNORMAL HIGH (ref 0–99)
NonHDL: 161.91
Total CHOL/HDL Ratio: 3
Triglycerides: 103 mg/dL (ref 0.0–149.0)
VLDL: 20.6 mg/dL (ref 0.0–40.0)

## 2020-01-01 LAB — HEMOGLOBIN A1C: Hgb A1c MFr Bld: 5.7 % (ref 4.6–6.5)

## 2020-01-01 LAB — BASIC METABOLIC PANEL
BUN: 10 mg/dL (ref 6–23)
CO2: 31 mEq/L (ref 19–32)
Calcium: 9.4 mg/dL (ref 8.4–10.5)
Chloride: 103 mEq/L (ref 96–112)
Creatinine, Ser: 0.8 mg/dL (ref 0.40–1.20)
GFR: 67.5 mL/min (ref >60.00)
Glucose, Bld: 103 mg/dL — ABNORMAL HIGH (ref 70–99)
Potassium: 4.3 mEq/L (ref 3.5–5.1)
Sodium: 138 mEq/L (ref 135–145)

## 2020-01-01 LAB — SEDIMENTATION RATE: Sed Rate: 12 mm/hr (ref 0–30)

## 2020-01-05 ENCOUNTER — Other Ambulatory Visit: Payer: Self-pay | Admitting: Internal Medicine

## 2020-01-05 DIAGNOSIS — E7800 Pure hypercholesterolemia, unspecified: Secondary | ICD-10-CM

## 2020-01-05 MED ORDER — ROSUVASTATIN CALCIUM 5 MG PO TABS: ORAL_TABLET | ORAL | 1 refills | Status: DC

## 2020-01-05 NOTE — Progress Notes (Signed)
rx sent in for crestor.  Order placed for f/u liver panel.

## 2020-02-16 ENCOUNTER — Other Ambulatory Visit: Payer: Self-pay

## 2020-02-16 ENCOUNTER — Other Ambulatory Visit (INDEPENDENT_AMBULATORY_CARE_PROVIDER_SITE_OTHER): Payer: Medicare Other

## 2020-02-16 DIAGNOSIS — E7800 Pure hypercholesterolemia, unspecified: Secondary | ICD-10-CM | POA: Diagnosis not present

## 2020-02-16 LAB — HEPATIC FUNCTION PANEL
ALT: 10 U/L (ref 0–35)
AST: 15 U/L (ref 0–37)
Albumin: 4.1 g/dL (ref 3.5–5.2)
Alkaline Phosphatase: 43 U/L (ref 39–117)
Bilirubin, Direct: 0.1 mg/dL (ref 0.0–0.3)
Total Bilirubin: 0.5 mg/dL (ref 0.2–1.2)
Total Protein: 6.9 g/dL (ref 6.0–8.3)

## 2020-02-17 ENCOUNTER — Ambulatory Visit (INDEPENDENT_AMBULATORY_CARE_PROVIDER_SITE_OTHER): Payer: Medicare Other | Admitting: Internal Medicine

## 2020-02-17 DIAGNOSIS — K219 Gastro-esophageal reflux disease without esophagitis: Secondary | ICD-10-CM

## 2020-02-17 DIAGNOSIS — R1084 Generalized abdominal pain: Secondary | ICD-10-CM

## 2020-02-17 DIAGNOSIS — M2550 Pain in unspecified joint: Secondary | ICD-10-CM | POA: Diagnosis not present

## 2020-02-17 DIAGNOSIS — E7800 Pure hypercholesterolemia, unspecified: Secondary | ICD-10-CM | POA: Diagnosis not present

## 2020-02-17 DIAGNOSIS — D649 Anemia, unspecified: Secondary | ICD-10-CM

## 2020-02-17 DIAGNOSIS — I10 Essential (primary) hypertension: Secondary | ICD-10-CM | POA: Diagnosis not present

## 2020-02-17 DIAGNOSIS — E039 Hypothyroidism, unspecified: Secondary | ICD-10-CM | POA: Diagnosis not present

## 2020-02-17 DIAGNOSIS — F32A Depression, unspecified: Secondary | ICD-10-CM

## 2020-02-17 DIAGNOSIS — R739 Hyperglycemia, unspecified: Secondary | ICD-10-CM

## 2020-02-17 DIAGNOSIS — G8929 Other chronic pain: Secondary | ICD-10-CM

## 2020-02-17 DIAGNOSIS — C8590 Non-Hodgkin lymphoma, unspecified, unspecified site: Secondary | ICD-10-CM

## 2020-02-17 DIAGNOSIS — N2889 Other specified disorders of kidney and ureter: Secondary | ICD-10-CM | POA: Diagnosis not present

## 2020-02-17 DIAGNOSIS — M545 Low back pain, unspecified: Secondary | ICD-10-CM

## 2020-02-17 DIAGNOSIS — F320 Major depressive disorder, single episode, mild: Secondary | ICD-10-CM
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
## 2020-02-18 LAB — BASIC METABOLIC PANEL
BUN: 13 mg/dL (ref 6–23)
CO2: 29 mEq/L (ref 19–32)
Calcium: 8.9 mg/dL (ref 8.4–10.5)
Chloride: 101 mEq/L (ref 96–112)
Creatinine, Ser: 0.86 mg/dL (ref 0.40–1.20)
GFR: 62.07 mL/min (ref >60.00)
Glucose, Bld: 90 mg/dL (ref 70–99)
Potassium: 4.5 mEq/L (ref 3.5–5.1)
Sodium: 135 mEq/L (ref 135–145)

## 2020-02-18 LAB — SEDIMENTATION RATE: Sed Rate: 18 mm/hr (ref 0–30)

## 2020-02-19 LAB — RHEUMATOID FACTOR: Rheumatoid fact SerPl-aCnc: <14 IU/mL (ref <14)

## 2020-02-19 LAB — ANA: Anti Nuclear Antibody (ANA): NEGATIVE

## 2020-02-22 ENCOUNTER — Encounter: Payer: Self-pay | Admitting: Internal Medicine

## 2020-02-22 NOTE — Assessment & Plan Note (Signed)
Blood pressure on recheck improved.  Continue lisinopril.  Follow pressures.  Follow metabolic panel.  

## 2020-02-22 NOTE — Assessment & Plan Note (Signed)
Previously saw oncology.  Declines further w/up or testing or evaluation.

## 2020-02-22 NOTE — Assessment & Plan Note (Signed)
Follow cbc.  

## 2020-02-22 NOTE — Assessment & Plan Note (Signed)
Abdominal discomfort and nausea.  Discussed further w/up and evaluation.  She declines.  Has a history of kidney mass and lymphoma.  Discussed scanning.  She continues to decline.  Continue protonix.  Follow.

## 2020-02-22 NOTE — Assessment & Plan Note (Signed)
Continue protonix  

## 2020-02-22 NOTE — Assessment & Plan Note (Signed)
Previously evaluated by urology and nephrology.  Declines further w/up and evaluation.   

## 2020-02-22 NOTE — Assessment & Plan Note (Signed)
On thyroid replacement.  Follow tsh.  

## 2020-02-22 NOTE — Assessment & Plan Note (Signed)
Low cholesterol diet and exercise.  Follow lipid panel.   

## 2020-02-22 NOTE — Assessment & Plan Note (Signed)
Low carb diet and exercise.  Follow met b and a1c.

## 2020-02-22 NOTE — Assessment & Plan Note (Signed)
S/p sacral fracture and compression fracture noted on MRI.  Have discussed bone density.  Discussed referral to Dr Sharlet Salina.  Declines further testing and evaluation and treatment.  Tylenol as directed.  Follow.

## 2020-02-22 NOTE — Assessment & Plan Note (Signed)
On cymbalta.  Stable.  

## 2020-03-02 DIAGNOSIS — H903 Sensorineural hearing loss, bilateral: Secondary | ICD-10-CM | POA: Diagnosis not present

## 2020-03-02 DIAGNOSIS — H6123 Impacted cerumen, bilateral: Secondary | ICD-10-CM | POA: Diagnosis not present

## 2020-04-08 ENCOUNTER — Other Ambulatory Visit: Payer: Self-pay | Admitting: Internal Medicine

## 2020-05-12 ENCOUNTER — Telehealth: Payer: Self-pay | Admitting: Internal Medicine

## 2020-05-12 MED ORDER — DULOXETINE HCL 60 MG PO CPEP: 60.0000 mg | ORAL_CAPSULE | Freq: Every day | ORAL | 0 refills | Status: DC

## 2020-05-12 NOTE — Telephone Encounter (Signed)
Pt needs refill on DULoxetine (CYMBALTA) 60 MG capsule and lisinopril

## 2020-05-14 MED ORDER — LISINOPRIL 40 MG PO TABS: 40.0000 mg | ORAL_TABLET | Freq: Every day | ORAL | 3 refills | Status: DC

## 2020-05-14 NOTE — Telephone Encounter (Signed)
Pt needs lisinopril. It wasn't called in with the duloxetine. She is completely out.

## 2020-05-14 NOTE — Addendum Note (Signed)
Addended byElpidio Galea T on: 05/14/2020 09:27 AM   Modules accepted: Orders

## 2020-05-20 ENCOUNTER — Ambulatory Visit: Payer: Medicare Other | Admitting: Internal Medicine

## 2020-05-24 ENCOUNTER — Observation Stay
Admission: EM | Admit: 2020-05-24 | Discharge: 2020-05-25 | Discharge status: Against Medical Advice | Payer: Medicare Other | Attending: Internal Medicine | Admitting: Internal Medicine

## 2020-05-24 ENCOUNTER — Emergency Department: Payer: Medicare Other

## 2020-05-24 ENCOUNTER — Other Ambulatory Visit: Payer: Self-pay

## 2020-05-24 DIAGNOSIS — C8590 Non-Hodgkin lymphoma, unspecified, unspecified site: Secondary | ICD-10-CM | POA: Diagnosis not present

## 2020-05-24 DIAGNOSIS — E1122 Type 2 diabetes mellitus with diabetic chronic kidney disease: Secondary | ICD-10-CM | POA: Insufficient documentation

## 2020-05-24 DIAGNOSIS — Z20822 Contact with and (suspected) exposure to covid-19: Secondary | ICD-10-CM | POA: Insufficient documentation

## 2020-05-24 DIAGNOSIS — N182 Chronic kidney disease, stage 2 (mild): Secondary | ICD-10-CM | POA: Insufficient documentation

## 2020-05-24 DIAGNOSIS — K219 Gastro-esophageal reflux disease without esophagitis: Secondary | ICD-10-CM | POA: Insufficient documentation

## 2020-05-24 DIAGNOSIS — R58 Hemorrhage, not elsewhere classified: Secondary | ICD-10-CM | POA: Diagnosis not present

## 2020-05-24 DIAGNOSIS — F329 Major depressive disorder, single episode, unspecified: Secondary | ICD-10-CM | POA: Insufficient documentation

## 2020-05-24 DIAGNOSIS — Z833 Family history of diabetes mellitus: Secondary | ICD-10-CM | POA: Insufficient documentation

## 2020-05-24 DIAGNOSIS — I129 Hypertensive chronic kidney disease with stage 1 through stage 4 chronic kidney disease, or unspecified chronic kidney disease: Secondary | ICD-10-CM | POA: Insufficient documentation

## 2020-05-24 DIAGNOSIS — N281 Cyst of kidney, acquired: Secondary | ICD-10-CM | POA: Insufficient documentation

## 2020-05-24 DIAGNOSIS — Z9049 Acquired absence of other specified parts of digestive tract: Secondary | ICD-10-CM | POA: Diagnosis not present

## 2020-05-24 DIAGNOSIS — K529 Noninfective gastroenteritis and colitis, unspecified: Secondary | ICD-10-CM | POA: Diagnosis not present

## 2020-05-24 DIAGNOSIS — E785 Hyperlipidemia, unspecified: Secondary | ICD-10-CM | POA: Diagnosis not present

## 2020-05-24 DIAGNOSIS — K449 Diaphragmatic hernia without obstruction or gangrene: Secondary | ICD-10-CM | POA: Diagnosis not present

## 2020-05-24 DIAGNOSIS — F32A Depression, unspecified: Secondary | ICD-10-CM | POA: Diagnosis present

## 2020-05-24 DIAGNOSIS — D631 Anemia in chronic kidney disease: Secondary | ICD-10-CM | POA: Insufficient documentation

## 2020-05-24 DIAGNOSIS — I10 Essential (primary) hypertension: Secondary | ICD-10-CM | POA: Diagnosis not present

## 2020-05-24 DIAGNOSIS — I700 Atherosclerosis of aorta: Secondary | ICD-10-CM | POA: Insufficient documentation

## 2020-05-24 DIAGNOSIS — K625 Hemorrhage of anus and rectum: Secondary | ICD-10-CM | POA: Diagnosis present

## 2020-05-24 DIAGNOSIS — M47816 Spondylosis without myelopathy or radiculopathy, lumbar region: Secondary | ICD-10-CM | POA: Insufficient documentation

## 2020-05-24 DIAGNOSIS — Z90710 Acquired absence of both cervix and uterus: Secondary | ICD-10-CM | POA: Diagnosis not present

## 2020-05-24 DIAGNOSIS — N179 Acute kidney failure, unspecified: Secondary | ICD-10-CM | POA: Insufficient documentation

## 2020-05-24 DIAGNOSIS — E039 Hypothyroidism, unspecified: Secondary | ICD-10-CM | POA: Insufficient documentation

## 2020-05-24 DIAGNOSIS — Z823 Family history of stroke: Secondary | ICD-10-CM | POA: Insufficient documentation

## 2020-05-24 DIAGNOSIS — Z8349 Family history of other endocrine, nutritional and metabolic diseases: Secondary | ICD-10-CM | POA: Diagnosis not present

## 2020-05-24 DIAGNOSIS — Z807 Family history of other malignant neoplasms of lymphoid, hematopoietic and related tissues: Secondary | ICD-10-CM | POA: Insufficient documentation

## 2020-05-24 DIAGNOSIS — E7800 Pure hypercholesterolemia, unspecified: Secondary | ICD-10-CM | POA: Diagnosis not present

## 2020-05-24 DIAGNOSIS — Z79899 Other long term (current) drug therapy: Secondary | ICD-10-CM | POA: Insufficient documentation

## 2020-05-24 DIAGNOSIS — N939 Abnormal uterine and vaginal bleeding, unspecified: Secondary | ICD-10-CM | POA: Diagnosis not present

## 2020-05-24 DIAGNOSIS — Z03818 Encounter for observation for suspected exposure to other biological agents ruled out: Secondary | ICD-10-CM | POA: Diagnosis not present

## 2020-05-24 DIAGNOSIS — F320 Major depressive disorder, single episode, mild: Secondary | ICD-10-CM

## 2020-05-24 LAB — CBC
HCT: 40 % (ref 36.0–46.0)
HCT: 34.4 % — ABNORMAL LOW (ref 36.0–46.0)
HCT: 35.7 % — ABNORMAL LOW (ref 36.0–46.0)
HCT: 33.6 % — ABNORMAL LOW (ref 36.0–46.0)
Hemoglobin: 13.5 g/dL (ref 12.0–15.0)
Hemoglobin: 11.7 g/dL — ABNORMAL LOW (ref 12.0–15.0)
Hemoglobin: 11.8 g/dL — ABNORMAL LOW (ref 12.0–15.0)
Hemoglobin: 11.3 g/dL — ABNORMAL LOW (ref 12.0–15.0)
MCH: 31.7 pg (ref 26.0–34.0)
MCH: 32.1 pg (ref 26.0–34.0)
MCH: 31.7 pg (ref 26.0–34.0)
MCH: 32 pg (ref 26.0–34.0)
MCHC: 33.8 g/dL (ref 30.0–36.0)
MCHC: 34 g/dL (ref 30.0–36.0)
MCHC: 33.1 g/dL (ref 30.0–36.0)
MCHC: 33.6 g/dL (ref 30.0–36.0)
MCV: 93.9 fL (ref 80.0–100.0)
MCV: 94.2 fL (ref 80.0–100.0)
MCV: 96 fL (ref 80.0–100.0)
MCV: 95.2 fL (ref 80.0–100.0)
Platelets: 316 10*3/uL (ref 150–400)
Platelets: 268 10*3/uL (ref 150–400)
Platelets: 277 10*3/uL (ref 150–400)
Platelets: 249 10*3/uL (ref 150–400)
RBC: 4.26 MIL/uL (ref 3.87–5.11)
RBC: 3.65 MIL/uL — ABNORMAL LOW (ref 3.87–5.11)
RBC: 3.72 MIL/uL — ABNORMAL LOW (ref 3.87–5.11)
RBC: 3.53 MIL/uL — ABNORMAL LOW (ref 3.87–5.11)
RDW: 14 % (ref 11.5–15.5)
RDW: 13.9 % (ref 11.5–15.5)
RDW: 13.9 % (ref 11.5–15.5)
RDW: 13.8 % (ref 11.5–15.5)
WBC: 20.1 10*3/uL — ABNORMAL HIGH (ref 4.0–10.5)
WBC: 18.1 10*3/uL — ABNORMAL HIGH (ref 4.0–10.5)
WBC: 17.1 10*3/uL — ABNORMAL HIGH (ref 4.0–10.5)
WBC: 14.2 10*3/uL — ABNORMAL HIGH (ref 4.0–10.5)
nRBC: 0 % (ref 0.0–0.2)
nRBC: 0 % (ref 0.0–0.2)
nRBC: 0 % (ref 0.0–0.2)
nRBC: 0 % (ref 0.0–0.2)

## 2020-05-24 LAB — TYPE AND SCREEN
ABO/RH(D): A NEG
Antibody Screen: NEGATIVE

## 2020-05-24 LAB — BASIC METABOLIC PANEL
Anion gap: 10 (ref 5–15)
BUN: 22 mg/dL (ref 8–23)
CO2: 24 mmol/L (ref 22–32)
Calcium: 9.1 mg/dL (ref 8.9–10.3)
Chloride: 103 mmol/L (ref 98–111)
Creatinine, Ser: 1.76 mg/dL — ABNORMAL HIGH (ref 0.44–1.00)
GFR calc Af Amer: 29 mL/min — ABNORMAL LOW (ref >60)
GFR calc non Af Amer: 25 mL/min — ABNORMAL LOW (ref >60)
Glucose, Bld: 137 mg/dL — ABNORMAL HIGH (ref 70–99)
Potassium: 4.5 mmol/L (ref 3.5–5.1)
Sodium: 137 mmol/L (ref 135–145)

## 2020-05-24 LAB — PROTIME-INR
INR: 1.1 (ref 0.8–1.2)
Prothrombin Time: 13.9 seconds (ref 11.4–15.2)

## 2020-05-24 LAB — HEPATIC FUNCTION PANEL
ALT: 14 U/L (ref 0–44)
AST: 16 U/L (ref 15–41)
Albumin: 3.7 g/dL (ref 3.5–5.0)
Alkaline Phosphatase: 39 U/L (ref 38–126)
Bilirubin, Direct: 0.2 mg/dL (ref 0.0–0.2)
Indirect Bilirubin: 0.9 mg/dL (ref 0.3–0.9)
Total Bilirubin: 1.1 mg/dL (ref 0.3–1.2)
Total Protein: 6.5 g/dL (ref 6.5–8.1)

## 2020-05-24 LAB — APTT: aPTT: 32 seconds (ref 24–36)

## 2020-05-24 LAB — LIPASE, BLOOD: Lipase: 25 U/L (ref 11–51)

## 2020-05-24 LAB — SARS CORONAVIRUS 2 BY RT PCR (HOSPITAL ORDER, PERFORMED IN ~~LOC~~ HOSPITAL LAB): SARS Coronavirus 2: NEGATIVE

## 2020-05-24 MED ORDER — DULOXETINE HCL 30 MG PO CPEP
60.0000 mg | ORAL_CAPSULE | Freq: Every day | ORAL | Status: DC
  Administered 2020-05-25: 60 mg via ORAL
  Filled 2020-05-24: qty 2

## 2020-05-24 MED ORDER — ROSUVASTATIN CALCIUM 10 MG PO TABS: 5.0000 mg | ORAL_TABLET | ORAL | Status: DC

## 2020-05-24 MED ORDER — PANTOPRAZOLE SODIUM 40 MG PO TBEC
40.0000 mg | DELAYED_RELEASE_TABLET | Freq: Every day | ORAL | Status: DC
  Administered 2020-05-25: 40 mg via ORAL
  Filled 2020-05-24: qty 1

## 2020-05-24 MED ORDER — PIPERACILLIN-TAZOBACTAM 3.375 G IVPB 30 MIN
3.3750 g | Freq: Once | INTRAVENOUS | Status: AC
  Administered 2020-05-24: 3.375 g via INTRAVENOUS
  Filled 2020-05-24: qty 50

## 2020-05-24 MED ORDER — SODIUM CHLORIDE 0.9 % IV BOLUS
500.0000 mL | Freq: Once | INTRAVENOUS | Status: AC
  Administered 2020-05-24: 500 mL via INTRAVENOUS

## 2020-05-24 MED ORDER — PIPERACILLIN-TAZOBACTAM 3.375 G IVPB
3.3750 g | Freq: Two times a day (BID) | INTRAVENOUS | Status: DC
  Administered 2020-05-24: 3.375 g via INTRAVENOUS
  Filled 2020-05-24: qty 50

## 2020-05-24 MED ORDER — VITAMIN B-12 1000 MCG PO TABS
3000.0000 ug | ORAL_TABLET | Freq: Every day | ORAL | Status: DC
  Administered 2020-05-25: 3000 ug via ORAL
  Filled 2020-05-24: qty 3

## 2020-05-24 MED ORDER — ONDANSETRON HCL 4 MG/2ML IJ SOLN: 4.0000 mg | Freq: Three times a day (TID) | INTRAMUSCULAR | Status: DC | PRN

## 2020-05-24 MED ORDER — HYDRALAZINE HCL 20 MG/ML IJ SOLN: 5.0000 mg | INTRAMUSCULAR | Status: DC | PRN

## 2020-05-24 MED ORDER — FERROUS SULFATE 325 (65 FE) MG PO TABS
325.0000 mg | ORAL_TABLET | Freq: Every day | ORAL | Status: DC
  Administered 2020-05-25: 325 mg via ORAL
  Filled 2020-05-24: qty 1

## 2020-05-24 MED ORDER — ACETAMINOPHEN 325 MG PO TABS
650.0000 mg | ORAL_TABLET | Freq: Four times a day (QID) | ORAL | Status: DC | PRN
  Administered 2020-05-24 – 2020-05-25 (×2): 650 mg via ORAL
  Filled 2020-05-24 (×2): qty 2

## 2020-05-24 MED ORDER — LEVOTHYROXINE SODIUM 75 MCG PO TABS
75.0000 ug | ORAL_TABLET | Freq: Every day | ORAL | Status: DC
  Administered 2020-05-25: 75 ug via ORAL
  Filled 2020-05-24: qty 1

## 2020-05-24 MED ORDER — IPRATROPIUM BROMIDE 0.03 % NA SOLN
2.0000 | Freq: Two times a day (BID) | NASAL | Status: DC
  Administered 2020-05-24 – 2020-05-25 (×2): 2 via NASAL
  Filled 2020-05-24: qty 30

## 2020-05-24 MED ORDER — MORPHINE SULFATE (PF) 2 MG/ML IV SOLN: 0.5000 mg | INTRAVENOUS | Status: DC | PRN

## 2020-05-24 MED ORDER — SODIUM CHLORIDE 0.9 % IV SOLN: INTRAVENOUS | Status: DC

## 2020-05-24 NOTE — Progress Notes (Signed)
Pharmacy Antibiotic Note  Tasha Avila is a 86 y.o. female admitted on 05/24/2020 with Intra-Abdominal Infection.  Pharmacy has been consulted for Zosyn dosing.  Plan: Zosyn 3.375g IV q12h (4 hour infusion)  Height: 5' (152.4 cm) Weight: 59 kg (130 lb) IBW/kg (Calculated) : 45.5  Temp (24hrs), Avg:97.1 F (36.2 C), Min:97.1 F (36.2 C), Max:97.1 F (36.2 C)  Recent Labs  Lab 05/24/20 0747 05/24/20 1216  WBC 20.1* 18.1*  CREATININE 1.76*  --     Estimated Creatinine Clearance: 17.4 mL/min (A) (by C-G formula based on SCr of 1.76 mg/dL (H)).    Allergies  Allergen Reactions  . No Known Drug Allergy     Antimicrobials this admission: Zosyn 6/21 >>   Dose adjustments this admission:  Microbiology results:  Thank you for allowing pharmacy to be a part of this patient's care.  Paulina Fusi, PharmD, BCPS 05/24/2020 2:28 PM

## 2020-05-24 NOTE — ED Triage Notes (Signed)
Pt c/o having vaginal bleeding since around 3am today , states she has not been feeling well for several months with N/V.. "hurting all over".Marland Kitchen

## 2020-05-24 NOTE — H&P (Signed)
History and Physical    Tasha Avila CXK:[id number] DOB: 11/30/1928 DOA: 05/24/2020  Referring MD/NP/PA:   PCP: Einar Pheasant, MD   Patient coming from:  The patient is coming from home.  At baseline, pt is independent for most of ADL.        Chief Complaint: Nausea, vomiting, diarrhea, abdominal pain, rectal bleeding  HPI: Tasha Avila is a 86 y.o. female with medical history significant of hypertension, hyperlipidemia, GERD, hypothyroidism, depression, anemia, non-Hodgkin lymphoma, CKD-2, who presents with nausea, vomiting, diarrhea and abdominal pain, rectal bleeding  Patient states that her symptoms started this early morning, including nausea, vomiting, diarrhea, abdominal pain.  Patient states that she has vaginal bleeding, but per ED physician who did rectal examination, pt actually has rectal bleeding.  Patient states that he has had at least 5 times of nonbilious nonbloody vomiting.  He has had more than 10 times of watery diarrhea.  Abdominal pain is located in the central abdomen, moderate, sharp, constant, nonradiating.  Denies chest pain, shortness breath, cough, fever, chills, symptoms of UTI or unilateral weakness.  ED Course: pt was found to have WBC 20.1, hemoglobin 13.5 --> 11.7, negative COVID-19 PCR, worsening renal function, temperature 97.1, blood pressure 131/58, heart rate 75, RR 16, oxygen saturation 100% on room air.  Patient is placed on MedSurg bed for observation.  GI, Dr. Vicente Males is consulted.  # CT abdomen/pelvis:  Findings consistent with infectious or inflammatory colitis of the transverse through rectosigmoid colon. No CT signs of ischemia.  Small hiatal hernia.  Aortic Atherosclerosis (ICD10-I70.0).  Review of Systems:   General: no fevers, chills, no body weight gain, has poor appetite, has fatigue HEENT: no blurry vision, hearing changes or sore throat Respiratory: no dyspnea, coughing, wheezing CV: no chest pain, no  palpitations GI: has nausea, vomiting, abdominal pain, diarrhea, rectal bleeding, no constipation GU: no dysuria, burning on urination, increased urinary frequency, hematuria  Ext: no leg edema Neuro: no unilateral weakness, numbness, or tingling, no vision change or hearing loss Skin: no rash, no skin tear. MSK: No muscle spasm, no deformity, no limitation of range of movement in spin Heme: No easy bruising.  Travel history: No recent long distant travel.  Allergy:  Allergies  Allergen Reactions  . No Known Drug Allergy     Past Medical History:  Diagnosis Date  . Anemia   . Arthritis   . GERD (gastroesophageal reflux disease)   . Hypercholesterolemia   . Hypertension   . Hypothyroidism     Past Surgical History:  Procedure Laterality Date  . ABDOMINAL HYSTERECTOMY  1960s   bladder tack at the same time, ovaries not removed  . LAPAROSCOPIC CHOLECYSTECTOMY  5/11    Social History:  reports that she has never smoked. She has never used smokeless tobacco. She reports that she does not drink alcohol and does not use drugs.  Family History:  Family History  Problem Relation Age of Onset  . Lymphoma Brother   . Stroke Brother   . Diabetes Brother   . Diabetes Brother        x2  . Stroke Brother   . Thyroid disease Brother   . Stroke Mother      Prior to Admission medications   Medication Sig Start Date End Date Taking? Authorizing Provider  acetaminophen (TYLENOL) 500 MG tablet Take 500 mg by mouth every 6 (six) hours as needed.    [provider]  DULoxetine (CYMBALTA) 60 MG capsule Take 1 capsule (  60 mg total) by mouth daily. 05/12/20   Einar Pheasant, MD  Homeopathic Products ([employer] LEG CRAMPS PAIN RELIEF PO) Take by mouth as needed.    [provider]  ipratropium (ATROVENT) 0.03 % nasal spray Place 2 sprays into both nostrils every 12 (twelve) hours. 12/18/17   Einar Pheasant, MD  levothyroxine (SYNTHROID) 75 MCG tablet Take 1 tablet (75 mcg total)  by mouth daily. 04/15/19   Einar Pheasant, MD  lisinopril (ZESTRIL) 40 MG tablet Take 1 tablet (40 mg total) by mouth daily. 05/14/20   Einar Pheasant, MD  nystatin cream (MYCOSTATIN) Apply 1 application topically 2 (two) times daily. 08/15/19   Einar Pheasant, MD  pantoprazole (PROTONIX) 40 MG tablet Take 1 tablet (40 mg total) by mouth daily. 04/15/19   Einar Pheasant, MD  rosuvastatin (CRESTOR) 5 MG tablet Take one tablet q Monday, Wednesday and Friday. 01/05/20   Einar Pheasant, MD    Physical Exam: Vitals:   05/24/20 0741 05/24/20 0747  BP: (!) 131/58   Pulse: 75   Resp: 16   Temp:  (!) 97.1 F (36.2 C)  TempSrc: Oral   SpO2: 100%   Weight: 59 kg   Height: 5' (1.524 m)    General: Not in acute distress HEENT:       Eyes: PERRL, EOMI, no scleral icterus.       ENT: No discharge from the ears and nose, no pharynx injection, no tonsillar enlargement.        Neck: No JVD, no bruit, no mass felt. Heme: No neck lymph node enlargement. Cardiac: S1/S2, RRR, No murmurs, No gallops or rubs. Respiratory: No rales, wheezing, rhonchi or rubs. GI: Soft, nondistended, has central abdominal tenderness, no rebound pain, no organomegaly, BS present. GU: No hematuria Ext: No pitting leg edema bilaterally. 2+DP/PT pulse bilaterally. Musculoskeletal: No joint deformities, No joint redness or warmth, no limitation of ROM in spin. Skin: No rashes.  Neuro: Alert, oriented X3, cranial nerves II-XII grossly intact, moves all extremities normally. Psych: Patient is not psychotic, no suicidal or hemocidal ideation.  Labs on Admission: I have personally reviewed following labs and imaging studies  CBC: Recent Labs  Lab 05/24/20 0747  WBC 20.1*  HGB 13.5  HCT 40.0  MCV 93.9  PLT 161   Basic Metabolic Panel: Recent Labs  Lab 05/24/20 0747  NA 137  K 4.5  CL 103  CO2 24  GLUCOSE 137*  BUN 22  CREATININE 1.76*  CALCIUM 9.1   GFR: Estimated Creatinine Clearance: 17.4 mL/min (A) (by  C-G formula based on SCr of 1.76 mg/dL (H)). Liver Function Tests: No results for input(s): AST, ALT, ALKPHOS, BILITOT, PROT, ALBUMIN in the last 168 hours. No results for input(s): LIPASE, AMYLASE in the last 168 hours. No results for input(s): AMMONIA in the last 168 hours. Coagulation Profile: No results for input(s): INR, PROTIME in the last 168 hours. Cardiac Enzymes: No results for input(s): CKTOTAL, CKMB, CKMBINDEX, TROPONINI in the last 168 hours. BNP (last 3 results) No results for input(s): PROBNP in the last 8760 hours. HbA1C: No results for input(s): HGBA1C in the last 72 hours. CBG: No results for input(s): GLUCAP in the last 168 hours. Lipid Profile: No results for input(s): CHOL, HDL, LDLCALC, TRIG, CHOLHDL, LDLDIRECT in the last 72 hours. Thyroid Function Tests: No results for input(s): TSH, T4TOTAL, FREET4, T3FREE, THYROIDAB in the last 72 hours. Anemia Panel: No results for input(s): VITAMINB12, FOLATE, FERRITIN, TIBC, IRON, RETICCTPCT in the last 72 hours. Urine  analysis:    Component Value Date/Time   COLORURINE YELLOW 09/26/2019 1522   APPEARANCEUR CLOUDY (A) 09/26/2019 1522   APPEARANCEUR Hazy 02/22/2014 1039   LABSPEC 1.011 09/26/2019 1522   LABSPEC 1.009 02/22/2014 1039   PHURINE 6.0 09/26/2019 1522   GLUCOSEU NEGATIVE 09/26/2019 1522   GLUCOSEU NEGATIVE 12/09/2018 1458   HGBUR NEGATIVE 09/26/2019 [place] 12/09/2018 1458   BILIRUBINUR Negative 02/22/2014 1039   KETONESUR NEGATIVE 09/26/2019 1522   PROTEINUR NEGATIVE 09/26/2019 1522   UROBILINOGEN 0.2 12/09/2018 1458   NITRITE NEGATIVE 09/26/2019 1522   LEUKOCYTESUR 2+ (A) 09/26/2019 1522   LEUKOCYTESUR 3+ 02/22/2014 1039   Sepsis Labs: @LABRCNTIP (procalcitonin:4,lacticidven:4) )No results found for this or any previous visit (from the past 240 hour(s)).   Radiological Exams on Admission: CT ABDOMEN PELVIS WO CONTRAST  Result Date: 05/24/2020 CLINICAL DATA:  Abdominal  distension and rectal bleeding. EXAM: CT ABDOMEN AND PELVIS WITHOUT CONTRAST TECHNIQUE: Multidetector CT imaging of the abdomen and pelvis was performed following the standard protocol without IV contrast. COMPARISON:  CT abdomen and pelvis 07/28/2018. FINDINGS: Lower chest: Lung bases clear.  No pleural or pericardial effusion. Hepatobiliary: Status post cholecystectomy. Punctate calcification in the right hepatic lobe is noted and unchanged. The liver is otherwise unremarkable. Biliary tree is also unremarkable. Pancreas: Unremarkable. No pancreatic ductal dilatation or surrounding inflammatory changes. Spleen: Normal in size without focal abnormality. Adrenals/Urinary Tract: Adrenal glands are unremarkable. Kidneys are normal, without renal calculi, solid lesion, or hydronephrosis. Small right renal cyst incidentally noted. Bladder is unremarkable. Stomach/Bowel: The walls of the transverse, descending and rectosigmoid colon are thickened with surrounding stranding consistent with colitis. Colon is largely decompressed. No pneumatosis, portal venous gas, free air or fluid collection. Small hiatal hernia is noted. The stomach is otherwise unremarkable. Small bowel and appendix appear normal. Vascular/Lymphatic: Aortic atherosclerosis. No enlarged abdominal or pelvic lymph nodes. Reproductive: Status post hysterectomy. No adnexal masses. Other: The patient is status post ventral hernia repair. Musculoskeletal: No acute bony abnormality. The patient is status post vertebral augmentation for compression fracture of L1. There is convex left scoliosis and multilevel lumbar degenerative change. IMPRESSION: Findings consistent with infectious or inflammatory colitis of the transverse through rectosigmoid colon. No CT signs of ischemia. Small hiatal hernia. Aortic Atherosclerosis (ICD10-I70.0). Electronically Signed   By: Inge Rise M.D.   On: 05/24/2020 11:04     EKG: Independently reviewed.  Sinus rhythm, QTC  442, early R wave progression, nonspecific T wave change  Assessment/Plan Principal Problem:   Acute colitis Active Problems:   GERD (gastroesophageal reflux disease)   Hypercholesterolemia   Hypothyroidism   Lymphoma ([place])   Essential (primary) hypertension   Mild depression (HCC)   Rectal bleeding   Acute renal failure superimposed on stage 2 chronic kidney disease (HCC)   Acute colitis: CT showed possible colitis of the transverse through rectosigmoid colon. No CT signs of ischemia.  -Placed on MedSurg bed for observation -IV Zosyn started -IV fluid: 500 cc normal saline, followed by 75 cc/h -As needed Zofran and morphine -Check C. difficile PCR and GI pathogen panel -GI, Dr. Vicente Males is consulted  Rectal bleeding: Hemoglobin 13.5 --> 11.7.  Most likely due to colitis -Follow-up CBC every 6 hours -INR/PTT, type screen -Transfuse blood if hemoglobin drops below 7  GERD (gastroesophageal reflux disease) -Protonix  Hypercholesterolemia -Crestor  Hypothyroidism -Synthroid  History of Lymphoma, non-Hodgkin lymphoma: -Follow-up with oncology  Essential (primary) hypertension -As needed hydralazine -Hold lisinopril due to worsening renal function  Mild depression (HCC) -Cymbalta  Acute renal failure superimposed on stage 2 chronic kidney disease ([place]): Baseline Cre is <1.0, pt's Cre is 1.76, BUN 22 on admission. Likely due to dehydration and continuation of ACEI - IVF as above - Follow up renal function by BMP - Avoid using renal toxic medications, hypotension and contrast dye (or carefully use) - Hold lisinopril    DVT ppx: SCD Code Status: Full code Family Communication:    Yes, patient's daughter at bed side Disposition Plan:  Anticipate discharge back to previous environment Consults called:  Dr. Vicente Males of GI Admission status: Med-surg bed for obs   Status is: Observation  The patient remains OBS appropriate and will d/c before 2 midnights.  Dispo: The  patient is from: Home              Anticipated d/c is to: Home              Anticipated d/c date is: 1 day              Patient currently is not medically stable to d/c.          Date of Service 05/24/2020    Ivor Costa [facility]   If 7PM-7AM, please contact night-coverage [email or web address] 05/24/2020, 11:36 AM

## 2020-05-24 NOTE — ED Notes (Signed)
Pt iv retaped.
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
## 2020-05-24 NOTE — Consult Note (Signed)
Tasha Avila , MD [address], [place], [place], [place], [id number] [address] [place], [place], [place], [address] Phone: [phone]  Fax: [phone]  Consultation  Referring Provider:   Dr Blaine Hamper  Primary Care Physician:  Einar Pheasant, MD Primary Gastroenterologist:  Dr. Gustavo Lah          Reason for Consultation:     Acute colitis   Date of Admission:  05/24/2020 Date of Consultation:  05/24/2020         HPI:   Tasha Avila is a 86 y.o. female got admitted to the emergency room for concerns of vaginal bleeding.  On examination in the ER bright red blood was noted in the rectum and no bleeding was noted in the vagina.  White cell count was 20,000.  CT scan of the abdomen and pelvis demonstrated findings consistent with infectious or inflammatory colitis of the transverse through rectosigmoid colon.On admission creatinine 0.86.  White cell count 18.1 today and hemoglobin 11.7 g.  Hepatic function panel normal.   I went to see and evaluate her in the emergency room.  She had a daughter in the room.  The patient is very hard of hearing.  The daughter said that the patient suffers from chronic abdominal pain but yesterday developed nausea vomiting followed by nonbloody diarrhea.  The diarrhea continued to the morning and this morning noticed bloody diarrhea.  Abdominal pain is much better now the nausea and vomiting have resolved and not had further bowel movements since morning.  Last colonoscopy probably 9 or 10 years back.  Denies any dizziness or syncopal episodes recently  Past Medical History:  Diagnosis Date  . Anemia   . Arthritis   . GERD (gastroesophageal reflux disease)   . Hypercholesterolemia   . Hypertension   . Hypothyroidism     Past Surgical History:  Procedure Laterality Date  . ABDOMINAL HYSTERECTOMY  1960s   bladder tack at the same time, ovaries not removed  . LAPAROSCOPIC CHOLECYSTECTOMY  5/11    Prior to Admission medications   Medication  Sig Start Date End Date Taking? Authorizing Provider  acetaminophen (TYLENOL) 500 MG tablet Take 500 mg by mouth every 6 (six) hours as needed.   Yes [provider]  Cyanocobalamin (B-12) 3000 MCG CAPS Take 3,000 mcg by mouth daily.   Yes [provider]  DULoxetine (CYMBALTA) 60 MG capsule Take 1 capsule (60 mg total) by mouth daily. 05/12/20  Yes Einar Pheasant, MD  ferrous sulfate 325 (65 FE) MG EC tablet Take 325 mg by mouth daily with breakfast.   Yes [provider]  Homeopathic Products ([employer] LEG CRAMPS PAIN RELIEF PO) Take by mouth as needed.   Yes [provider]  ipratropium (ATROVENT) 0.03 % nasal spray Place 2 sprays into both nostrils every 12 (twelve) hours. 12/18/17  Yes Einar Pheasant, MD  levothyroxine (SYNTHROID) 75 MCG tablet Take 1 tablet (75 mcg total) by mouth daily. 04/15/19  Yes Einar Pheasant, MD  lisinopril (ZESTRIL) 40 MG tablet Take 1 tablet (40 mg total) by mouth daily. 05/14/20  Yes Einar Pheasant, MD  pantoprazole (PROTONIX) 40 MG tablet Take 1 tablet (40 mg total) by mouth daily. 04/15/19  Yes Einar Pheasant, MD  rosuvastatin (CRESTOR) 5 MG tablet Take one tablet q Monday, Wednesday and Friday. 01/05/20  Yes Einar Pheasant, MD    Family History  Problem Relation Age of Onset  . Lymphoma Brother   . Stroke Brother   . Diabetes Brother   .  Diabetes Brother        x2  . Stroke Brother   . Thyroid disease Brother   . Stroke Mother      Social History   Tobacco Use  . Smoking status: Never Smoker  . Smokeless tobacco: Never Used  Vaping Use  . Vaping Use: Never used  Substance Use Topics  . Alcohol use: No    Alcohol/week: 0.0 standard drinks  . Drug use: No    Allergies as of 05/24/2020 - Review Complete 05/24/2020  Allergen Reaction Noted  . No known drug allergy  10/09/2012    Review of Systems:    All systems reviewed and negative except where noted in HPI.   Physical Exam:  Vital signs in last 24  hours: Temp:  [97.1 F (36.2 C)] 97.1 F (36.2 C) (06/21 0747) Pulse Rate:  [75] 75 (06/21 0741) Resp:  [16] 16 (06/21 0741) BP: (131)/(58) 131/58 (06/21 0741) SpO2:  [100 %] 100 % (06/21 0741) Weight:  [59 kg] 59 kg (06/21 0741)   General:   Pleasant, cooperative in NAD Head:  Normocephalic and atraumatic. Eyes:   No icterus.   Conjunctiva pink. PERRLA. Ears: Very hard of hearing Neck:  Supple; no masses or thyroidomegaly Lungs: Respirations even and unlabored. Lungs clear to auscultation bilaterally.   No wheezes, crackles, or rhonchi.  Heart:  Regular rate and rhythm;  Without murmur, clicks, rubs or gallops Abdomen:  Soft, nondistended, nontender. Normal bowel sounds. No appreciable masses or hepatomegaly.  No rebound or guarding.  Neurologic:  Alert and oriented x3;  grossly normal neurologically. Psych:  Alert and cooperative. Normal affect.  LAB RESULTS: Recent Labs    05/24/20 0747 05/24/20 1216  WBC 20.1* 18.1*  HGB 13.5 11.7*  HCT 40.0 34.4*  PLT 316 268   BMET Recent Labs    05/24/20 0747  NA 137  K 4.5  CL 103  CO2 24  GLUCOSE 137*  BUN 22  CREATININE 1.76*  CALCIUM 9.1   LFT Recent Labs    05/24/20 1216  PROT 6.5  ALBUMIN 3.7  AST 16  ALT 14  ALKPHOS 39  BILITOT 1.1  BILIDIR 0.2  IBILI 0.9   PT/INR Recent Labs    05/24/20 1044  LABPROT 13.9  INR 1.1    STUDIES: CT ABDOMEN PELVIS WO CONTRAST  Result Date: 05/24/2020 CLINICAL DATA:  Abdominal distension and rectal bleeding. EXAM: CT ABDOMEN AND PELVIS WITHOUT CONTRAST TECHNIQUE: Multidetector CT imaging of the abdomen and pelvis was performed following the standard protocol without IV contrast. COMPARISON:  CT abdomen and pelvis 07/28/2018. FINDINGS: Lower chest: Lung bases clear.  No pleural or pericardial effusion. Hepatobiliary: Status post cholecystectomy. Punctate calcification in the right hepatic lobe is noted and unchanged. The liver is otherwise unremarkable. Biliary tree is also  unremarkable. Pancreas: Unremarkable. No pancreatic ductal dilatation or surrounding inflammatory changes. Spleen: Normal in size without focal abnormality. Adrenals/Urinary Tract: Adrenal glands are unremarkable. Kidneys are normal, without renal calculi, solid lesion, or hydronephrosis. Small right renal cyst incidentally noted. Bladder is unremarkable. Stomach/Bowel: The walls of the transverse, descending and rectosigmoid colon are thickened with surrounding stranding consistent with colitis. Colon is largely decompressed. No pneumatosis, portal venous gas, free air or fluid collection. Small hiatal hernia is noted. The stomach is otherwise unremarkable. Small bowel and appendix appear normal. Vascular/Lymphatic: Aortic atherosclerosis. No enlarged abdominal or pelvic lymph nodes. Reproductive: Status post hysterectomy. No adnexal masses. Other: The patient is status post ventral hernia repair.  Musculoskeletal: No acute bony abnormality. The patient is status post vertebral augmentation for compression fracture of L1. There is convex left scoliosis and multilevel lumbar degenerative change. IMPRESSION: Findings consistent with infectious or inflammatory colitis of the transverse through rectosigmoid colon. No CT signs of ischemia. Small hiatal hernia. Aortic Atherosclerosis (ICD10-I70.0). Electronically Signed   By: Inge Rise M.D.   On: 05/24/2020 11:04      Impression / Plan:   Tasha Avila is a 86 y.o. y/o female presented to the emergency room with concerns initially for vaginal bleeding which eventually turned out to be rectal bleeding.  CT scan of the abdomen demonstrated acute colitis from the transverse colon all the way to the rectosigmoid junction.  Differentials include infectious colitis and ischemic colitis.  Plan 1.  Monitor CBC and transfuse.  Ensure adequate IV hydration.  Continue conservative management 2.  Check stool for C. difficile and GI PCR. 3.  Usually ischemic  colitis and infectious colitis self resolve within 2 to 3 days time.  Thank you for involving me in the care of this patient.      LOS: 0 days   Tasha Bellows, MD  05/24/2020, 1:31 PM

## 2020-05-25 DIAGNOSIS — I10 Essential (primary) hypertension: Secondary | ICD-10-CM | POA: Diagnosis not present

## 2020-05-25 DIAGNOSIS — K529 Noninfective gastroenteritis and colitis, unspecified: Secondary | ICD-10-CM | POA: Diagnosis not present

## 2020-05-25 DIAGNOSIS — N179 Acute kidney failure, unspecified: Secondary | ICD-10-CM | POA: Diagnosis not present

## 2020-05-25 DIAGNOSIS — N182 Chronic kidney disease, stage 2 (mild): Secondary | ICD-10-CM | POA: Diagnosis not present

## 2020-05-25 LAB — CBC
HCT: 34.3 % — ABNORMAL LOW (ref 36.0–46.0)
HCT: 35.2 % — ABNORMAL LOW (ref 36.0–46.0)
Hemoglobin: 11.1 g/dL — ABNORMAL LOW (ref 12.0–15.0)
Hemoglobin: 11.8 g/dL — ABNORMAL LOW (ref 12.0–15.0)
MCH: 31.3 pg (ref 26.0–34.0)
MCH: 32.2 pg (ref 26.0–34.0)
MCHC: 32.4 g/dL (ref 30.0–36.0)
MCHC: 33.5 g/dL (ref 30.0–36.0)
MCV: 96.6 fL (ref 80.0–100.0)
MCV: 95.9 fL (ref 80.0–100.0)
Platelets: 254 10*3/uL (ref 150–400)
Platelets: 267 10*3/uL (ref 150–400)
RBC: 3.55 MIL/uL — ABNORMAL LOW (ref 3.87–5.11)
RBC: 3.67 MIL/uL — ABNORMAL LOW (ref 3.87–5.11)
RDW: 13.8 % (ref 11.5–15.5)
RDW: 13.6 % (ref 11.5–15.5)
WBC: 12.2 10*3/uL — ABNORMAL HIGH (ref 4.0–10.5)
WBC: 13.6 10*3/uL — ABNORMAL HIGH (ref 4.0–10.5)
nRBC: 0 % (ref 0.0–0.2)
nRBC: 0 % (ref 0.0–0.2)

## 2020-05-25 LAB — BASIC METABOLIC PANEL
Anion gap: 6 (ref 5–15)
BUN: 17 mg/dL (ref 8–23)
CO2: 25 mmol/L (ref 22–32)
Calcium: 8.5 mg/dL — ABNORMAL LOW (ref 8.9–10.3)
Chloride: 108 mmol/L (ref 98–111)
Creatinine, Ser: 0.93 mg/dL (ref 0.44–1.00)
GFR calc Af Amer: >60 mL/min (ref >60)
GFR calc non Af Amer: 54 mL/min — ABNORMAL LOW (ref >60)
Glucose, Bld: 80 mg/dL (ref 70–99)
Potassium: 4.6 mmol/L (ref 3.5–5.1)
Sodium: 139 mmol/L (ref 135–145)

## 2020-05-25 LAB — GLUCOSE, CAPILLARY: Glucose-Capillary: 59 mg/dL — ABNORMAL LOW (ref 70–99)

## 2020-05-25 MED ORDER — PIPERACILLIN-TAZOBACTAM 3.375 G IVPB
3.3750 g | Freq: Three times a day (TID) | INTRAVENOUS | Status: DC
  Administered 2020-05-25: 3.375 g via INTRAVENOUS
  Filled 2020-05-25: qty 50

## 2020-05-25 MED ORDER — DEXTROSE 50 % IV SOLN: 50.0000 mL | Freq: Once | INTRAVENOUS | Status: DC

## 2020-05-25 MED ORDER — METRONIDAZOLE 500 MG PO TABS
500.0000 mg | ORAL_TABLET | Freq: Three times a day (TID) | ORAL | 0 refills | Status: AC
Start: 2020-05-25 — End: 2020-05-30

## 2020-05-25 MED ORDER — AMOXICILLIN-POT CLAVULANATE 875-125 MG PO TABS
1.0000 | ORAL_TABLET | Freq: Two times a day (BID) | ORAL | 0 refills | Status: AC
Start: 2020-05-25 — End: 2020-05-30

## 2020-05-25 MED ORDER — DEXTROSE 50 % IV SOLN
12.5000 g | Freq: Once | INTRAVENOUS | Status: AC
  Administered 2020-05-25: 12.5 g via INTRAVENOUS
  Filled 2020-05-25: qty 50

## 2020-05-25 NOTE — Progress Notes (Signed)
Patient observed to have another occurrence of bleeding from rectum. Only a small amount was noted in the toilet. Patient states she is not really hungry and abdomen remains sore.   Fuller Mandril, RN

## 2020-05-25 NOTE — Evaluation (Signed)
Occupational Therapy Evaluation Patient Details Name: Tasha Avila MRN: [id number] DOB: 1928/10/04 Today's Date: 05/25/2020    History of Present Illness 86 y.o. female with medical history significant of hypertension, hyperlipidemia, GERD, hypothyroidism, depression, anemia, non-Hodgkin lymphoma, CKD-2, who presents with nausea, vomiting, diarrhea and abdominal pain, rectal bleeding   Clinical Impression   Pt was seen for OT evaluation this date. Prior to hospital admission, pt was ambulating with varying AD depending on how "steady" the pt felt. Daughter checks on her every other day and additional people check on her the other days. Pt has life alert, per dtr. Pt relatively independent with sponge bath, dressing, and light meal prep as well as taking medications. When asked, daughter endorses checking behind the pt to ensure proper medication use. Pt endorses memory deficits that "bother her." Pt repeatedly shares with therapist eagerness to go home and how she has friends and her daughter to help. Very HOH, hearing aides with dead batteries. Once EOB with supervision for safety, Pt stands prematurely prior to safe set up. CGA for transfer, max multimodal cues for safety. Pt noted to attempt to pick up RW when turning or transitioning across surfaces despite cues, ambulates quickly despite cues, and generally unsafe. Daughter assists with IV pole mgt. CGA for STS toilet transfer and toileting hygiene. Once back in bed, pt able to appropriately respond to approx 50% of situational safety scenarios, daughter attempting to interject to assist pt in correct responses. When asked if she had a medical emergency, pt responds that she would call the police, but unable to recall number to call (I.e., 911) and fails to recall she has a life alert pendant requiring daughter to remind pt.    Currently pt demonstrates impairments as described below including HOH, cognition (safety awareness, awareness of  deficits, memory), balance, and strength (See OT problem list) which functionally limit her ability to perform ADL/self-care tasks. safely Pt currently requires CGA for ADL and mobility and MAX multimodal cues for safety throughout session. Cues to redirect when perseverating on desire to return home.  Educated dtr on importance of supervision/assist for medication mgt to ensure safety as well as strong recommendation for 24/7 supervision/assist to maximize pt's safety and minimize falls risk.  Pt would benefit from additional skilled OT to address noted impairments and functional limitations (see below for any additional details) in order to maximize safety and independence while minimizing falls risk and caregiver burden. Upon hospital discharge, recommend [place] and 24/7 supervision/assist to maximize pt safety and return to functional independence during meaningful occupations of daily life. RNCM notified.    Follow Up Recommendations  Home health OT;Supervision/Assistance - 24 hour    Equipment Recommendations  None recommended by OT    Recommendations for Other Services       Precautions / Restrictions Precautions Precautions: Fall Precaution Comments: very high falls risk Restrictions Weight Bearing Restrictions: No      Mobility Bed Mobility Overal bed mobility: Needs Assistance Bed Mobility: Supine to Sit;Sit to Supine     Supine to sit: Supervision Sit to supine: Supervision   General bed mobility comments: sup for safety  Transfers Overall transfer level: Needs assistance Equipment used: Rolling walker (2 wheeled) Transfers: Sit to/from Stand Sit to Stand: Supervision;Min guard         General transfer comment: pt stands from EOB quickly prior to therapist's instruction, poor RW mgt requiring multimodal cues, CGA for STS t/f from toilet with cues for RW mgt again  Balance Overall balance assessment: Needs assistance Sitting-balance support: No upper extremity  supported;Feet supported Sitting balance-Leahy Scale: Fair     Standing balance support: Bilateral upper extremity supported;During functional activity Standing balance-Leahy Scale: Fair Standing balance comment: mild LOB a few times during ADL mobility to/from bathroom requiring cues and CGA from therapist to ensure safety, tendency to pick up RW despite cues when transitioning over surfaces and turning.                           ADL either performed or assessed with clinical judgement   ADL Overall ADL's : Needs assistance/impaired                         Toilet Transfer: RW;Ambulation;Regular Toilet;Min guard;+2 for safety/equipment;Cueing for safety Toilet Transfer Details (indicate cue type and reason): max cues for safety with  poor carryover of RW mgt during transfer, daughter managing IV pole Toileting- Clothing Manipulation and Hygiene: Min guard;Sit to/from stand         General ADL Comments: CGA adn cues for safety for all ADL and ADL mobility, very poor safety awareness making her at high risk of falls and injury.     Vision         Perception     Praxis      Pertinent Vitals/Pain Pain Assessment:  (pt endorses chronic abdominal pain that "hurts a lot", and head hurts a little) Pain Intervention(s): Limited activity within patient's tolerance;Monitored during session;Repositioned     Hand Dominance Right   Extremity/Trunk Assessment Upper Extremity Assessment Upper Extremity Assessment: Generalized weakness;Overall [facility] for tasks assessed   Lower Extremity Assessment Lower Extremity Assessment: Generalized weakness;Overall [facility] for tasks assessed   Cervical / Trunk Assessment Cervical / Trunk Assessment: Kyphotic   Communication Communication Communication: HOH (has hearing aids, but batteries are dead)   Cognition Arousal/Alertness: Awake/alert Behavior During Therapy: Impulsive Overall Cognitive Status: History of cognitive  impairments - at baseline                                 General Comments: Pt and dtr endorse some memory deficits, pt with signficantly impaired awareness of deficits and safety during session requiring redirection and max multimodal cues. High risk of falls. Able to appropriately respond to approx 50% of situational safety scenarios given.   General Comments       Exercises Other Exercises Other Exercises: Pt/dtr educated in falls prevention with significant emphasis on safety and risk of injury resulting from falls 2/2 pt's deficits. Educated dtr on importance of supervision/assist for medication mgt to ensure safety   Shoulder Instructions      Home Living Family/patient expects to be discharged to:: Private residence Living Arrangements: Alone Available Help at Discharge: Family;Available PRN/intermittently (daughter goes by every other day and other people check on her the other days) Type of Home: House Home Access: Stairs to enter [employer] of Steps: 2 Entrance Stairs-Rails: Left Home Layout: One level         Bathroom Toilet: Standard     Home Equipment: Toilet riser;Walker - 2 wheels;Other (comment);Crutches;Walker - 4 wheels (huricane)          Prior Functioning/Environment Level of Independence: Needs assistance  Gait / Transfers Assistance Needed: primarily uses cane, if feeling more "wobbly" will use loftstrand crutch or 2WW vs rollator ADL's / Homemaking Assistance  Needed: takes sponge baths, fixes most meals, daughter cleans /laundry, daughter drives, indep with med mgt   Comments: at least 1 in past 12 months        OT Problem List: Decreased strength;Pain;Decreased cognition;Decreased safety awareness;Decreased activity tolerance;Impaired balance (sitting and/or standing);Decreased knowledge of use of DME or AE      OT Treatment/Interventions: Self-care/ADL training;Therapeutic exercise;Therapeutic activities;Cognitive  remediation/compensation;DME and/or AE instruction;Patient/family education;Balance training    OT Goals(Current goals can be found in the care plan section) Acute Rehab OT Goals Patient Stated Goal: go home OT Goal Formulation: With patient/family Time For Goal Achievement: 06/08/20 Potential to Achieve Goals: Good ADL Goals Pt Will Perform Lower Body Dressing: with supervision;sit to/from stand Pt Will Transfer to Toilet: with supervision;ambulating (toilet w/ riser/rails, LRAD for amb) Additional ADL Goal #1: Pt will perform medication mgt at supervision level to maximize safety and accuracy.  OT Frequency: Min 1X/week   Barriers to D/C: Decreased caregiver support          Co-evaluation              AM-PAC OT "6 Clicks" Daily Activity     Outcome Measure Help from another person eating meals?: None Help from another person taking care of personal grooming?: None Help from another person toileting, which includes using toliet, bedpan, or urinal?: A Little Help from another person bathing (including washing, rinsing, drying)?: A Little Help from another person to put on and taking off regular upper body clothing?: None Help from another person to put on and taking off regular lower body clothing?: A Little 6 Click Score: 21   End of Session Equipment Utilized During Treatment: Rolling walker  Activity Tolerance: Patient tolerated treatment well Patient left: in bed;with call bell/phone within reach;with bed alarm set;with family/visitor present  OT Visit Diagnosis: Other abnormalities of gait and mobility (R26.89);Muscle weakness (generalized) (M62.81);Pain;History of falling (Z91.81);Other symptoms and signs involving cognitive function Pain - Right/Left:  (head and abdomen)                Time: 8242-3536 OT Time Calculation (min): 30 min Charges:  OT General Charges $OT Visit: 1 Visit OT Evaluation $OT Eval Moderate Complexity: 1 Mod OT Treatments $Self Care/Home  Management : 8-22 mins  Jeni Salles, MPH, MS, OTR/L ascom [phone] 05/25/20, 3:17 PM

## 2020-05-25 NOTE — Progress Notes (Signed)
Pharmacy Antibiotic Note  Tasha Avila is a 86 y.o. female admitted on 05/24/2020 with Intra-Abdominal Infection.  Pharmacy has been consulted for Zosyn dosing.  Plan: Zosyn 3.375g IV q8h (4 hour infusion).   Renal function improving (Scr 1.76 >> 0.93). Zosyn frequency increased based on CrCl > 20 mL/min.  Height: 5' (152.4 cm) Weight: 59 kg (130 lb) IBW/kg (Calculated) : 45.5  Temp (24hrs), Avg:98.2 F (36.8 C), Min:97.6 F (36.4 C), Max:98.9 F (37.2 C)  Recent Labs  Lab 05/24/20 0747 05/24/20 1216 05/24/20 1758 05/24/20 2328 05/25/20 0503  WBC 20.1* 18.1* 17.1* 14.2* 12.2*  CREATININE 1.76*  --   --   --  0.93    Estimated Creatinine Clearance: 33 mL/min (by C-G formula based on SCr of 0.93 mg/dL).    Allergies  Allergen Reactions   No Known Drug Allergy     Antimicrobials this admission: Zosyn 6/21 >>   Dose adjustments this admission: 6/22: Zosyn frequency increased from q12h to q8h based on CrCl > 20 mL/min  Microbiology results:  Thank you for allowing pharmacy to be a part of this patients care.  [place] Resident 05/25/2020 9:07 AM

## 2020-05-25 NOTE — Discharge Instructions (Signed)
Follow-up with PCP in 1 week. Follow-up with GI in 2 weeks. Please come back to emergency room if rectal bleeding get worse.

## 2020-05-25 NOTE — Discharge Summary (Signed)
Physician Discharge Summary  Patient ID: Tasha Avila MRN: [id number] DOB/AGE: 84/07/1928 84 y.o.   Patient is leaving the hospital [place].  Admit date: 05/24/2020 Discharge date: 05/25/2020  Admission Diagnoses:  Discharge Diagnoses:  Principal Problem:   Acute colitis Active Problems:   GERD (gastroesophageal reflux disease)   Hypercholesterolemia   Hypothyroidism   Lymphoma ([place])   Essential (primary) hypertension   Mild depression ([place])   Rectal bleeding   Acute renal failure superimposed on stage 2 chronic kidney disease ([place])   Discharged Condition: fair  Hospital Course:  Tasha Avila is a 86 y.o. female with medical history significant of hypertension, hyperlipidemia, GERD, hypothyroidism, depression, anemia, non-Hodgkin lymphoma, CKD-2, who presents with nausea, vomiting, diarrhea and abdominal pain, rectal bleeding  Patient states that her symptoms started this early morning, including nausea, vomiting, diarrhea, abdominal pain.  Patient states that she has vaginal bleeding, but per ED physician who did rectal examination, pt actually has rectal bleeding.  Patient states that he has had at least 5 times of nonbilious nonbloody vomiting.  He has had more than 10 times of watery diarrhea.  Abdominal pain is located in the central abdomen, moderate, sharp, constant, nonradiating.  Denies chest pain, shortness breath, cough, fever, chills, symptoms of UTI or unilateral weakness.  ED Course: pt was found to have WBC 20.1, hemoglobin 13.5 --> 11.7, negative COVID-19 PCR, worsening renal function, temperature 97.1, blood pressure 131/58, heart rate 75, RR 16, oxygen saturation 100% on room air.  Patient is placed on MedSurg bed for observation.  GI, Dr. Vicente Males is consulted.  Hospital course:  He is giving IV fluids in the hospital, she also given Zosyn for possible colitis.  She is seen by GI.  Her diarrhea so far had improved.  However, she still  has some rectal bleeding.  Renal function has improved.  I had a long discussion with the patient and the family, patient refused to stay in the hospital at this point.  Daughter has signed [facility] paperwork.  At this point, I will continue 5-day antibiotics with Augmentin and Flagyl.   Consults: GI  Significant Diagnostic Studies:  CT ABDOMEN AND PELVIS WITHOUT CONTRAST  TECHNIQUE: Multidetector CT imaging of the abdomen and pelvis was performed following the standard protocol without IV contrast.  COMPARISON:  CT abdomen and pelvis 07/28/2018.  FINDINGS: Lower chest: Lung bases clear.  No pleural or pericardial effusion.  Hepatobiliary: Status post cholecystectomy. Punctate calcification in the right hepatic lobe is noted and unchanged. The liver is otherwise unremarkable. Biliary tree is also unremarkable.  Pancreas: Unremarkable. No pancreatic ductal dilatation or surrounding inflammatory changes.  Spleen: Normal in size without focal abnormality.  Adrenals/Urinary Tract: Adrenal glands are unremarkable. Kidneys are normal, without renal calculi, solid lesion, or hydronephrosis. Small right renal cyst incidentally noted. Bladder is unremarkable.  Stomach/Bowel: The walls of the transverse, descending and rectosigmoid colon are thickened with surrounding stranding consistent with colitis. Colon is largely decompressed. No pneumatosis, portal venous gas, free air or fluid collection. Small hiatal hernia is noted. The stomach is otherwise unremarkable. Small bowel and appendix appear normal.  Vascular/Lymphatic: Aortic atherosclerosis. No enlarged abdominal or pelvic lymph nodes.  Reproductive: Status post hysterectomy. No adnexal masses.  Other: The patient is status post ventral hernia repair.  Musculoskeletal: No acute bony abnormality. The patient is status post vertebral augmentation for compression fracture of L1. There is convex left scoliosis and  multilevel lumbar degenerative change.  IMPRESSION: Findings consistent with infectious  or inflammatory colitis of the transverse through rectosigmoid colon. No CT signs of ischemia.  Small hiatal hernia.  Aortic Atherosclerosis (ICD10-I70.0).   Electronically Signed   By: Inge Rise M.D.   On: 05/24/2020 11:04    Treatments: IV hydration, zosyn  Discharge Exam: Blood pressure (!) 152/70, pulse (!) 59, temperature 98.8 F (37.1 C), temperature source Oral, resp. rate 14, height 5' (1.524 m), weight 59 kg, SpO2 97 %. General appearance: alert and cooperative Resp: clear to auscultation bilaterally Cardio: regular rate and rhythm, S1, S2 normal, no murmur, click, rub or gallop GI: soft, non-tender; bowel sounds normal; no masses,  no organomegaly Extremities: no edema  Disposition: Discharge disposition: 01-Home or Self Care       Discharge Instructions    Diet - low sodium heart healthy   Complete by: As directed    Increase activity slowly   Complete by: As directed      Allergies as of 05/25/2020      Reactions   No Known Drug Allergy       Medication List    STOP taking these medications   lisinopril 40 MG tablet Commonly known as: ZESTRIL     TAKE these medications   acetaminophen 500 MG tablet Commonly known as: TYLENOL Take 500 mg by mouth every 6 (six) hours as needed.   amoxicillin-clavulanate 875-125 MG tablet Commonly known as: Augmentin Take 1 tablet by mouth 2 (two) times daily for 5 days.   B-12 3000 MCG Caps Take 3,000 mcg by mouth daily.   [employer] LEG CRAMPS PAIN RELIEF PO Take by mouth as needed.   DULoxetine 60 MG capsule Commonly known as: CYMBALTA Take 1 capsule (60 mg total) by mouth daily.   ferrous sulfate 325 (65 FE) MG EC tablet Take 325 mg by mouth daily with breakfast.   ipratropium 0.03 % nasal spray Commonly known as: Atrovent Place 2 sprays into both nostrils every 12 (twelve) hours.   levothyroxine 75  MCG tablet Commonly known as: SYNTHROID Take 1 tablet (75 mcg total) by mouth daily.   metroNIDAZOLE 500 MG tablet Commonly known as: FLAGYL Take 1 tablet (500 mg total) by mouth 3 (three) times daily for 5 days.   pantoprazole 40 MG tablet Commonly known as: PROTONIX Take 1 tablet (40 mg total) by mouth daily.   rosuvastatin 5 MG tablet Commonly known as: Crestor Take one tablet q Monday, Wednesday and Friday.       Follow-up Information    Einar Pheasant, MD Follow up in 1 week(s).   Specialty: Internal Medicine Contact information: [address] [place] [address] [phone]        Jonathon Bellows, MD Follow up in 2 week(s).   Specialty: Gastroenterology Contact information: [place] [address] [phone]               Signed: Sharen Hones 05/25/2020, 2:47 PM

## 2020-05-25 NOTE — Progress Notes (Signed)
Tasha Avila , MD [address] [place], [place], [place], [address] [address], [place], [place], [place], [address] Phone: [phone]  Fax: [phone]   Tasha Avila is being followed for acute colitis  Day 1 of follow up   Subjective: Small bowel movement with minimal blood.    Objective: Vital signs in last 24 hours: Vitals:   05/24/20 1813 05/24/20 1944 05/25/20 0057 05/25/20 0747  BP: (!) 132/56 (!) 152/58 123/83 (!) 149/57  Pulse: 77 63 62 71  Resp: 20 20 20 14   Temp: 98.3 F (36.8 C) 97.8 F (36.6 C) 98.9 F (37.2 C) 97.6 F (36.4 C)  TempSrc: Oral Oral Oral Oral  SpO2: 100% 97% 93% 95%  Weight:      Height:       Weight change:   Intake/Output Summary (Last 24 hours) at 05/25/2020 0940 Last data filed at 05/25/2020 0746 Gross per 24 hour  Intake 1183.47 ml  Output 400 ml  Net 783.47 ml     Exam:  Abdomen: soft, nontender, normal bowel sounds   Lab Results: @LABTEST2 @ Micro Results: Recent Results (from the past 240 hour(s))  SARS Coronavirus 2 by RT PCR (hospital order, performed in [facility] hospital lab) Nasopharyngeal Nasopharyngeal Swab     Status: None   Collection Time: 05/24/20 10:44 AM   Specimen: Nasopharyngeal Swab  Result Value Ref Range Status   SARS Coronavirus 2 NEGATIVE NEGATIVE Final    Comment: (NOTE) SARS-CoV-2 target nucleic acids are NOT DETECTED.  The SARS-CoV-2 RNA is generally detectable in upper and lower respiratory specimens during the acute phase of infection. The lowest concentration of SARS-CoV-2 viral copies this assay can detect is 250 copies / mL. A negative result does not preclude SARS-CoV-2 infection and should not be used as the sole basis for treatment or other patient management decisions.  A negative result may occur with improper specimen collection / handling, submission of specimen other than nasopharyngeal swab, presence of viral mutation(s) within the areas targeted by this assay,  and inadequate number of viral copies (<250 copies / mL). A negative result must be combined with clinical observations, patient history, and epidemiological information.  Fact Sheet for Patients:   StrictlyIdeas.no  Fact Sheet for Healthcare Providers: BankingDealers.co.za  This test is not yet approved or  cleared by the [place] FDA and has been authorized for detection and/or diagnosis of SARS-CoV-2 by FDA under an Emergency Use Authorization (EUA).  This EUA will remain in effect (meaning this test can be used) for the duration of the COVID-19 declaration under Section 564(b)(1) of the Act, 21 U.S.C. section 360bbb-3(b)(1), unless the authorization is terminated or revoked sooner.  Performed at [facility], [address] [place], [place] [address]   CULTURE, BLOOD (ROUTINE X 2) w Reflex to ID Panel     Status: None (Preliminary result)   Collection Time: 05/24/20 12:15 PM   Specimen: BLOOD  Result Value Ref Range Status   Specimen Description BLOOD LEFT ANTECUBITAL  Final   Special Requests   Final    BOTTLES DRAWN AEROBIC AND ANAEROBIC Blood Culture adequate volume   Culture   Final    NO GROWTH < 24 HOURS Performed at [facility], [address] [place], [place] [address]    Report Status PENDING  Incomplete  CULTURE, BLOOD (ROUTINE X 2) w Reflex to ID Panel     Status: None (Preliminary result)   Collection Time: 05/24/20 12:16 PM  Specimen: BLOOD  Result Value Ref Range Status   Specimen Description BLOOD BLOOD LEFT HAND  Final   Special Requests   Final    BOTTLES DRAWN AEROBIC AND ANAEROBIC Blood Culture adequate volume   Culture   Final    NO GROWTH < 24 HOURS Performed at [facility], [address] [place], [place] [address]    Report Status PENDING  Incomplete   Studies/Results: CT ABDOMEN PELVIS WO CONTRAST  Result Date: 05/24/2020 CLINICAL DATA:  Abdominal  distension and rectal bleeding. EXAM: CT ABDOMEN AND PELVIS WITHOUT CONTRAST TECHNIQUE: Multidetector CT imaging of the abdomen and pelvis was performed following the standard protocol without IV contrast. COMPARISON:  CT abdomen and pelvis 07/28/2018. FINDINGS: Lower chest: Lung bases clear.  No pleural or pericardial effusion. Hepatobiliary: Status post cholecystectomy. Punctate calcification in the right hepatic lobe is noted and unchanged. The liver is otherwise unremarkable. Biliary tree is also unremarkable. Pancreas: Unremarkable. No pancreatic ductal dilatation or surrounding inflammatory changes. Spleen: Normal in size without focal abnormality. Adrenals/Urinary Tract: Adrenal glands are unremarkable. Kidneys are normal, without renal calculi, solid lesion, or hydronephrosis. Small right renal cyst incidentally noted. Bladder is unremarkable. Stomach/Bowel: The walls of the transverse, descending and rectosigmoid colon are thickened with surrounding stranding consistent with colitis. Colon is largely decompressed. No pneumatosis, portal venous gas, free air or fluid collection. Small hiatal hernia is noted. The stomach is otherwise unremarkable. Small bowel and appendix appear normal. Vascular/Lymphatic: Aortic atherosclerosis. No enlarged abdominal or pelvic lymph nodes. Reproductive: Status post hysterectomy. No adnexal masses. Other: The patient is status post ventral hernia repair. Musculoskeletal: No acute bony abnormality. The patient is status post vertebral augmentation for compression fracture of L1. There is convex left scoliosis and multilevel lumbar degenerative change. IMPRESSION: Findings consistent with infectious or inflammatory colitis of the transverse through rectosigmoid colon. No CT signs of ischemia. Small hiatal hernia. Aortic Atherosclerosis (ICD10-I70.0). Electronically Signed   By: Inge Rise M.D.   On: 05/24/2020 11:04   Medications: I have reviewed the patient's current  medications. Scheduled Meds: . DULoxetine  60 mg Oral Daily  . ferrous sulfate  325 mg Oral Q breakfast  . ipratropium  2 spray Each Nare Q12H  . levothyroxine  75 mcg Oral Daily  . pantoprazole  40 mg Oral Daily  . [START ON 05/26/2020] rosuvastatin  5 mg Oral Once per day on Mon Wed Fri  . vitamin B-12  3,000 mcg Oral Daily   Continuous Infusions: . sodium chloride 75 mL/hr at 05/25/20 0808  . piperacillin-tazobactam (ZOSYN)  IV     PRN Meds:.acetaminophen, hydrALAZINE, morphine injection, ondansetron (ZOFRAN) IV   Assessment: Principal Problem:   Acute colitis Active Problems:   GERD (gastroesophageal reflux disease)   Hypercholesterolemia   Hypothyroidism   Lymphoma ([place])   Essential (primary) hypertension   Mild depression ([facility])   Rectal bleeding   Acute renal failure superimposed on stage 2 chronic kidney disease ([place])  Tasha Avila is a 86 y.o. y/o female presented to the emergency room with concerns initially for vaginal bleeding which eventually turned out to be rectal bleeding.  CT scan of the abdomen demonstrated acute colitis from the transverse colon all the way to the rectosigmoid junction.  Differentials include infectious colitis and ischemic colitis.Hb stable at 11.1 grams  Plan 1.  F/u  stool for C. difficile and GI PCR. 3.  Usually ischemic colitis and infectious colitis self resolve within 2 to 3 days time.Presently appears to  be resolving , abdomen non tender , suggest to advance to soft diet      LOS: 0 days   Tasha Bellows, MD 05/25/2020, 9:40 AM

## 2020-05-25 NOTE — Care Management Obs Status (Signed)
[facility] NOTIFICATION   Patient Details  Name: Tasha Avila MRN: [id number] Date of Birth: 04/20/1928   Medicare Observation Status Notification Given:  No (Left AMA)    Beverly Sessions, RN 05/25/2020, 3:02 PM

## 2020-05-25 NOTE — Progress Notes (Signed)
Patient requesting to leave AMA, but refusing to sign AMA form. Daughter signed for patient. Patient aware that hospital stay may not be paid by insurance. AMA form placed in chart.   Fuller Mandril, RN

## 2020-05-25 NOTE — Progress Notes (Signed)
Patient escorted by wheelchair to medical mall. Patient placed in daughter's vehicle. Daughter will be transporting patient home.   Fuller Mandril, RN

## 2020-05-25 NOTE — TOC Progression Note (Signed)
Transition of Care [facility]) - Progression Note    Patient Details  Name: Tasha Avila MRN: [id number] Date of Birth: Jun 15, 1928  Transition of [facility]) CM/SW Contact  Beverly Sessions, RN Phone Number: 05/25/2020, 3:03 PM  Clinical Narrative:    Notified that patient leaving facility AMA RNCM offered to meet patient and family in the room to discuss recommendation of home health.   Per nursing staff AMA form has already been signed, patient is agitated and does not wish to wait.      Expected Discharge Plan: Against Medical Advice    Expected Discharge Plan and Services Expected Discharge Plan: Against Medical Advice         Expected Discharge Date: 05/25/20                                     Social Determinants of Health (SDOH) Interventions    Readmission Risk Interventions No flowsheet data found.

## 2020-05-25 NOTE — Progress Notes (Signed)
Hypoglycemic Event  CBG: 59  Treatment: D50 25 mL (12.5 gm)  Symptoms: None  Follow-up CBG: Time: 0830 CBG Result: patient refused finger stick  Possible Reasons for Event: Other: NPO  Comments/MD notified: Dr. Roosevelt Locks notified. Soft diet ordered, continue to monitor.     Fuller Mandril RN

## 2020-05-27 ENCOUNTER — Other Ambulatory Visit: Payer: Self-pay

## 2020-05-28 ENCOUNTER — Ambulatory Visit: Payer: Medicare Other | Admitting: Internal Medicine

## 2020-05-29 LAB — CULTURE, BLOOD (ROUTINE X 2)
Culture: NO GROWTH
Culture: NO GROWTH
Special Requests: ADEQUATE
Special Requests: ADEQUATE

## 2020-06-01 ENCOUNTER — Ambulatory Visit (INDEPENDENT_AMBULATORY_CARE_PROVIDER_SITE_OTHER): Payer: Medicare Other | Admitting: Internal Medicine

## 2020-06-01 ENCOUNTER — Other Ambulatory Visit: Payer: Self-pay

## 2020-06-01 ENCOUNTER — Other Ambulatory Visit: Payer: Self-pay | Admitting: Internal Medicine

## 2020-06-01 ENCOUNTER — Encounter: Payer: Self-pay | Admitting: Internal Medicine

## 2020-06-01 DIAGNOSIS — N2889 Other specified disorders of kidney and ureter: Secondary | ICD-10-CM

## 2020-06-01 DIAGNOSIS — F32A Depression, unspecified: Secondary | ICD-10-CM

## 2020-06-01 DIAGNOSIS — F320 Major depressive disorder, single episode, mild: Secondary | ICD-10-CM

## 2020-06-01 DIAGNOSIS — Z8719 Personal history of other diseases of the digestive system: Secondary | ICD-10-CM | POA: Diagnosis not present

## 2020-06-01 DIAGNOSIS — D649 Anemia, unspecified: Secondary | ICD-10-CM | POA: Diagnosis not present

## 2020-06-01 DIAGNOSIS — R739 Hyperglycemia, unspecified: Secondary | ICD-10-CM

## 2020-06-01 DIAGNOSIS — K625 Hemorrhage of anus and rectum: Secondary | ICD-10-CM

## 2020-06-01 DIAGNOSIS — E039 Hypothyroidism, unspecified: Secondary | ICD-10-CM

## 2020-06-01 DIAGNOSIS — C8590 Non-Hodgkin lymphoma, unspecified, unspecified site: Secondary | ICD-10-CM | POA: Diagnosis not present

## 2020-06-01 DIAGNOSIS — I10 Essential (primary) hypertension: Secondary | ICD-10-CM

## 2020-06-01 DIAGNOSIS — E7800 Pure hypercholesterolemia, unspecified: Secondary | ICD-10-CM

## 2020-06-01 DIAGNOSIS — K219 Gastro-esophageal reflux disease without esophagitis: Secondary | ICD-10-CM

## 2020-06-01 DIAGNOSIS — R21 Rash and other nonspecific skin eruption: Secondary | ICD-10-CM

## 2020-06-01 DIAGNOSIS — K529 Noninfective gastroenteritis and colitis, unspecified: Secondary | ICD-10-CM

## 2020-06-01 LAB — BASIC METABOLIC PANEL
BUN: 9 mg/dL (ref 6–23)
CO2: 29 mEq/L (ref 19–32)
Calcium: 9 mg/dL (ref 8.4–10.5)
Chloride: 102 mEq/L (ref 96–112)
Creatinine, Ser: 0.81 mg/dL (ref 0.40–1.20)
GFR: 66.47 mL/min (ref >60.00)
Glucose, Bld: 92 mg/dL (ref 70–99)
Potassium: 4.2 mEq/L (ref 3.5–5.1)
Sodium: 139 mEq/L (ref 135–145)

## 2020-06-01 LAB — CBC WITH DIFFERENTIAL/PLATELET
Basophils Absolute: 0.1 10*3/uL (ref 0.0–0.1)
Basophils Relative: 1.1 % (ref 0.0–3.0)
Eosinophils Absolute: 0.1 10*3/uL (ref 0.0–0.7)
Eosinophils Relative: 1.2 % (ref 0.0–5.0)
HCT: 36.2 % (ref 36.0–46.0)
Hemoglobin: 11.9 g/dL — ABNORMAL LOW (ref 12.0–15.0)
Lymphocytes Relative: 21.9 % (ref 12.0–46.0)
Lymphs Abs: 1.9 10*3/uL (ref 0.7–4.0)
MCHC: 32.9 g/dL (ref 30.0–36.0)
MCV: 97.3 fl (ref 78.0–100.0)
Monocytes Absolute: 1 10*3/uL (ref 0.1–1.0)
Monocytes Relative: 11.3 % (ref 3.0–12.0)
Neutro Abs: 5.5 10*3/uL (ref 1.4–7.7)
Neutrophils Relative %: 64.5 % (ref 43.0–77.0)
Platelets: 344 10*3/uL (ref 150.0–400.0)
RBC: 3.72 Mil/uL — ABNORMAL LOW (ref 3.87–5.11)
RDW: 14.1 % (ref 11.5–15.5)
WBC: 8.5 10*3/uL (ref 4.0–10.5)

## 2020-06-01 LAB — IBC + FERRITIN
Ferritin: 97 ng/mL (ref 10.0–291.0)
Iron: 75 ug/dL (ref 42–145)
Saturation Ratios: 30.1 % (ref 20.0–50.0)
Transferrin: 178 mg/dL — ABNORMAL LOW (ref 212.0–360.0)

## 2020-06-01 MED ORDER — NYSTATIN 100000 UNIT/GM EX CREA: 1.0000 "application " | TOPICAL_CREAM | Freq: Two times a day (BID) | CUTANEOUS | 0 refills | Status: DC

## 2020-06-01 MED ORDER — NYSTATIN 100000 UNIT/GM EX POWD
1.0000 | Freq: Two times a day (BID) | CUTANEOUS | 1 refills | Status: DC
Start: 2020-06-01 — End: 2020-09-02

## 2020-06-01 NOTE — Progress Notes (Signed)
Patient ID: Tasha Avila, female   DOB: 1928/05/20, 86 y.o.   MRN: [id number]   Subjective:    Patient ID: Tasha Avila, female    DOB: 1928/01/10, 86 y.o.   MRN: [id number]  HPI This visit occurred during the SARS-CoV-2 public health emergency.  Safety protocols were in place, including screening questions prior to the visit, additional usage of staff PPE, and extensive cleaning of exam room while observing appropriate contact time as indicated for disinfecting solutions.  Patient here for hospital follow up.  She is accompanied by her daughter.  history obtained from both of them.  She was admitted 05/24/20 and discharged 05/25/20 AMA.  Was admitted with nausea, vomiting, diarrhea and abdominal pain with rectal bleeding.  Elevated wbc count.  hgb 13.5 down to 11.7.  Negative covid.  Given IVFs.  Also treated with zosyn for colitis.  Evaluated by GI.  Decided to leave the hospital AMA.  Was discharged home with oral abx.  States since her discharge, her GI symptoms have improved.  No nausea or vomiting now.  Abdominal pain has improved.  No further bleeding.  She is eating.  Declines home therapy.  Has had issues with her stomach, acid reflux and swallowing previously.  Has previously declined GI evaluation.  Now agreeable.  Also reports persistent rash beneath her breast.  Has had issues with yeast.  Has tries nystatin topical.  Helps some. Discussed referral to dermatology given worsening and persistent problem.    Past Medical History:  Diagnosis Date  . Anemia   . Arthritis   . GERD (gastroesophageal reflux disease)   . Hypercholesterolemia   . Hypertension   . Hypothyroidism    Past Surgical History:  Procedure Laterality Date  . ABDOMINAL HYSTERECTOMY  1960s   bladder tack at the same time, ovaries not removed  . LAPAROSCOPIC CHOLECYSTECTOMY  5/11   Family History  Problem Relation Age of Onset  . Lymphoma Brother   . Stroke Brother   . Diabetes Brother   . Diabetes  Brother        x2  . Stroke Brother   . Thyroid disease Brother   . Stroke Mother    Social History   Socioeconomic History  . Marital status: Widowed    Spouse name: Not on file  . Number of children: 2  . Years of education: Not on file  . Highest education level: Not on file  Occupational History  . Not on file  Tobacco Use  . Smoking status: Never Smoker  . Smokeless tobacco: Never Used  Vaping Use  . Vaping Use: Never used  Substance and Sexual Activity  . Alcohol use: No    Alcohol/week: 0.0 standard drinks  . Drug use: No  . Sexual activity: Not on file  Other Topics Concern  . Not on file  Social History Narrative  . Not on file   Social Determinants of Health   Financial Resource Strain:   . Difficulty of Paying Living Expenses:   Food Insecurity:   . Worried About Charity fundraiser in the Last Year:   . Arboriculturist in the Last Year:   Transportation Needs:   . Film/video editor (Medical):   Marland Kitchen Lack of Transportation (Non-Medical):   Physical Activity:   . Days of Exercise per Week:   . Minutes of Exercise per Session:   Stress:   . Feeling of Stress :   Social Connections:   . Frequency  of Communication with Friends and Family:   . Frequency of Social Gatherings with Friends and Family:   . Attends Religious Services:   . Active Member of Clubs or Organizations:   . Attends Archivist Meetings:   Marland Kitchen Marital Status:     Outpatient Encounter Medications as of 06/01/2020  Medication Sig  . acetaminophen (TYLENOL) 500 MG tablet Take 500 mg by mouth every 6 (six) hours as needed.  . Cyanocobalamin (B-12) 3000 MCG CAPS Take 3,000 mcg by mouth daily.  . DULoxetine (CYMBALTA) 60 MG capsule Take 1 capsule (60 mg total) by mouth daily.  . ferrous sulfate 325 (65 FE) MG EC tablet Take 325 mg by mouth daily with breakfast.  . Homeopathic Products ([employer] LEG CRAMPS PAIN RELIEF PO) Take by mouth as needed.  Marland Kitchen lisinopril (ZESTRIL) 40 MG tablet  Take 40 mg by mouth daily.  . pantoprazole (PROTONIX) 40 MG tablet Take 1 tablet (40 mg total) by mouth daily.  Marland Kitchen Propylene Glycol (SYSTANE BALANCE OP) Apply 1 drop to eye as needed.  . rosuvastatin (CRESTOR) 5 MG tablet Take one tablet q Monday, Wednesday and Friday.  Marland Kitchen SALINE NASAL SPRAY NA Place into the nose as needed.  . [DISCONTINUED] levothyroxine (SYNTHROID) 75 MCG tablet Take 1 tablet (75 mcg total) by mouth daily.  Marland Kitchen nystatin (MYCOSTATIN/NYSTOP) powder Apply 1 application topically 2 (two) times daily.  Marland Kitchen nystatin cream (MYCOSTATIN) Apply 1 application topically 2 (two) times daily.  . [DISCONTINUED] ipratropium (ATROVENT) 0.03 % nasal spray Place 2 sprays into both nostrils every 12 (twelve) hours. (Patient not taking: Reported on 06/01/2020)   No facility-administered encounter medications on file as of 06/01/2020.    Review of Systems  Constitutional: Negative for appetite change, fever and unexpected weight change.  HENT: Negative for congestion and sinus pressure.   Respiratory: Negative for cough, chest tightness and shortness of breath.   Cardiovascular: Negative for chest pain, palpitations and leg swelling.  Gastrointestinal:       No abdominal pain now.  Eating.  No nausea or vomiting now.  No further bleeding.    Genitourinary: Negative for difficulty urinating and dysuria.  Musculoskeletal: Negative for joint swelling and myalgias.  Skin: Positive for rash. Negative for wound.  Neurological: Negative for dizziness, light-headedness and headaches.  Psychiatric/Behavioral: Negative for agitation and dysphoric mood.       Objective:    Physical Exam Vitals reviewed.  Constitutional:      General: She is not in acute distress.    Appearance: Normal appearance.  HENT:     Head: Normocephalic and atraumatic.     Right Ear: External ear normal.     Left Ear: External ear normal.  Eyes:     General: No scleral icterus.       Right eye: No discharge.        Left  eye: No discharge.     Conjunctiva/sclera: Conjunctivae normal.  Neck:     Thyroid: No thyromegaly.  Cardiovascular:     Rate and Rhythm: Normal rate and regular rhythm.  Pulmonary:     Effort: No respiratory distress.     Breath sounds: Normal breath sounds. No wheezing.  Abdominal:     General: Bowel sounds are normal.     Palpations: Abdomen is soft.     Tenderness: There is no abdominal tenderness.  Musculoskeletal:        General: No swelling or tenderness.     Cervical back: Neck supple. No tenderness.  Lymphadenopathy:     Cervical: No cervical adenopathy.  Skin:    Comments: Erythematous rash - under both breasts.  Appears to be c/w yeast.    Neurological:     Mental Status: She is alert.  Psychiatric:        Mood and Affect: Mood normal.        Behavior: Behavior normal.     BP 132/72 (BP Location: Left Arm, Patient Position: Sitting, Cuff Size: Normal)   Pulse 73   Temp (!) 97.5 F (36.4 C) (Temporal)   Resp 15   Ht 5' (1.524 m)   Wt 130 lb 3.2 oz (59.1 kg)   SpO2 99%   BMI 25.43 kg/m  Wt Readings from Last 3 Encounters:  06/01/20 130 lb 3.2 oz (59.1 kg)  05/24/20 130 lb (59 kg)  02/17/20 136 lb (61.7 kg)     Lab Results  Component Value Date   WBC 8.5 06/01/2020   HGB 11.9 (L) 06/01/2020   HCT 36.2 06/01/2020   PLT 344.0 06/01/2020   GLUCOSE 92 06/01/2020   CHOL 234 (H) 01/01/2020   TRIG 103.0 01/01/2020   HDL 71.70 01/01/2020   LDLDIRECT 123.3 10/24/2012   LDLCALC 141 (H) 01/01/2020   ALT 14 05/24/2020   AST 16 05/24/2020   NA 139 06/01/2020   K 4.2 06/01/2020   CL 102 06/01/2020   CREATININE 0.81 06/01/2020   BUN 9 06/01/2020   CO2 29 06/01/2020   TSH 0.81 09/26/2019   INR 1.1 05/24/2020   HGBA1C 5.7 01/01/2020    CT ABDOMEN PELVIS WO CONTRAST  Result Date: 05/24/2020 CLINICAL DATA:  Abdominal distension and rectal bleeding. EXAM: CT ABDOMEN AND PELVIS WITHOUT CONTRAST TECHNIQUE: Multidetector CT imaging of the abdomen and pelvis  was performed following the standard protocol without IV contrast. COMPARISON:  CT abdomen and pelvis 07/28/2018. FINDINGS: Lower chest: Lung bases clear.  No pleural or pericardial effusion. Hepatobiliary: Status post cholecystectomy. Punctate calcification in the right hepatic lobe is noted and unchanged. The liver is otherwise unremarkable. Biliary tree is also unremarkable. Pancreas: Unremarkable. No pancreatic ductal dilatation or surrounding inflammatory changes. Spleen: Normal in size without focal abnormality. Adrenals/Urinary Tract: Adrenal glands are unremarkable. Kidneys are normal, without renal calculi, solid lesion, or hydronephrosis. Small right renal cyst incidentally noted. Bladder is unremarkable. Stomach/Bowel: The walls of the transverse, descending and rectosigmoid colon are thickened with surrounding stranding consistent with colitis. Colon is largely decompressed. No pneumatosis, portal venous gas, free air or fluid collection. Small hiatal hernia is noted. The stomach is otherwise unremarkable. Small bowel and appendix appear normal. Vascular/Lymphatic: Aortic atherosclerosis. No enlarged abdominal or pelvic lymph nodes. Reproductive: Status post hysterectomy. No adnexal masses. Other: The patient is status post ventral hernia repair. Musculoskeletal: No acute bony abnormality. The patient is status post vertebral augmentation for compression fracture of L1. There is convex left scoliosis and multilevel lumbar degenerative change. IMPRESSION: Findings consistent with infectious or inflammatory colitis of the transverse through rectosigmoid colon. No CT signs of ischemia. Small hiatal hernia. Aortic Atherosclerosis (ICD10-I70.0). Electronically Signed   By: Inge Rise M.D.   On: 05/24/2020 11:04       Assessment & Plan:   Problem List Items Addressed This Visit    Acute colitis    Treated with IV abx.  Discharged on oral abx.  No pain.  Eating.  No further bleeding.  Refer to GI  as outlined.        Relevant Orders   Ambulatory  referral to Gastroenterology   Anemia    Recent decrease in hgb during hospitalization.  Recheck cbc today.  Refer to GI.  Pt denies any further bleeding.        Relevant Orders   CBC with Differential/Platelet (Completed)   Basic metabolic panel (Completed)   IBC + Ferritin (Completed)   Ambulatory referral to Gastroenterology   GERD (gastroesophageal reflux disease)    On protonix.  Still with issues with acid reflux.  Refer to GI as outlined.        History of GI bleed    Recently admitted with rectal bleeding and associated nausea, vomiting and abdominal pain.  CT as outlined.  Treated for colitis.  Left AMA before evaluation complete.  Discharged on oral abx.  Is feeling better.  Eating.  Recheck cbc.  Discussed f/u with GI.  She is agreeable now.  Has declined previously.        Relevant Orders   Ambulatory referral to Gastroenterology   Hyperglycemia    Low carb diet and exercise.  Follow met b and a1c.       Hypertension    Blood pressure as outlined.  On lisinopril.  Follow pressures.  Follow metabolic panel.        Relevant Medications   lisinopril (ZESTRIL) 40 MG tablet   Hypothyroidism    On thyroid replacement.  Follow tsh.        Lymphoma ([place]) (Chronic)    Previously saw oncology.  Has declined further w/up or testing or evaluation.        Mild depression ([place])    On cymbalta.  Stable.       Pure hypercholesterolemia    Follow lipid panel.        Relevant Medications   lisinopril (ZESTRIL) 40 MG tablet   Rash    Persistent increased rash beneath her breast.  Nystatin cream and powder.  Dermatology referral.        Relevant Orders   Ambulatory referral to Dermatology   Rectal bleeding    Recently admitted as outlined.  Treated for colitis.  Left hospital AMA - before w/up complete.  Refer to GI as outlined.        Relevant Orders   Ambulatory referral to Gastroenterology   Renal mass     Previously evaluated by urology and nephrology.  Declines further w/up and evaluation.            Einar Pheasant, MD

## 2020-06-07 ENCOUNTER — Encounter: Payer: Self-pay | Admitting: Internal Medicine

## 2020-06-07 DIAGNOSIS — Z8719 Personal history of other diseases of the digestive system: Secondary | ICD-10-CM | POA: Insufficient documentation

## 2020-06-07 NOTE — Assessment & Plan Note (Signed)
Recent decrease in hgb during hospitalization.  Recheck cbc today.  Refer to GI.  Pt denies any further bleeding.

## 2020-06-07 NOTE — Assessment & Plan Note (Signed)
Recently admitted as outlined.  Treated for colitis.  Left hospital AMA - before w/up complete.  Refer to GI as outlined.

## 2020-06-07 NOTE — Assessment & Plan Note (Signed)
On thyroid replacement.  Follow tsh.  

## 2020-06-07 NOTE — Assessment & Plan Note (Signed)
Persistent increased rash beneath her breast.  Nystatin cream and powder.  Dermatology referral.

## 2020-06-07 NOTE — Assessment & Plan Note (Signed)
On cymbalta.  Stable.  

## 2020-06-07 NOTE — Assessment & Plan Note (Signed)
Treated with IV abx.  Discharged on oral abx.  No pain.  Eating.  No further bleeding.  Refer to GI as outlined.

## 2020-06-07 NOTE — Assessment & Plan Note (Signed)
On protonix.  Still with issues with acid reflux.  Refer to GI as outlined.

## 2020-06-07 NOTE — Assessment & Plan Note (Signed)
Previously saw oncology.  Has declined further w/up or testing or evaluation.

## 2020-06-07 NOTE — Assessment & Plan Note (Signed)
Follow lipid panel.   

## 2020-06-07 NOTE — Assessment & Plan Note (Signed)
Blood pressure as outlined.  On lisinopril.  Follow pressures.  Follow metabolic panel.  

## 2020-06-07 NOTE — Assessment & Plan Note (Signed)
Previously evaluated by urology and nephrology.  Declines further w/up and evaluation.

## 2020-06-07 NOTE — Assessment & Plan Note (Signed)
Recently admitted with rectal bleeding and associated nausea, vomiting and abdominal pain.  CT as outlined.  Treated for colitis.  Left AMA before evaluation complete.  Discharged on oral abx.  Is feeling better.  Eating.  Recheck cbc.  Discussed f/u with GI.  She is agreeable now.  Has declined previously.

## 2020-06-07 NOTE — Assessment & Plan Note (Signed)
Low carb diet and exercise.  Follow met b and a1c.  

## 2020-06-08 ENCOUNTER — Other Ambulatory Visit: Payer: Self-pay | Admitting: Internal Medicine

## 2020-07-08 ENCOUNTER — Ambulatory Visit (INDEPENDENT_AMBULATORY_CARE_PROVIDER_SITE_OTHER): Payer: Medicare Other | Admitting: Dermatology

## 2020-07-08 ENCOUNTER — Other Ambulatory Visit: Payer: Self-pay

## 2020-07-08 ENCOUNTER — Other Ambulatory Visit: Payer: Self-pay | Admitting: Internal Medicine

## 2020-07-08 DIAGNOSIS — B372 Candidiasis of skin and nail: Secondary | ICD-10-CM | POA: Diagnosis not present

## 2020-07-08 DIAGNOSIS — L304 Erythema intertrigo: Secondary | ICD-10-CM | POA: Diagnosis not present

## 2020-07-08 MED ORDER — KETOCONAZOLE 2 % EX CREA: 1.0000 "application " | TOPICAL_CREAM | Freq: Every day | CUTANEOUS | 2 refills | Status: AC

## 2020-07-08 MED ORDER — FLUCONAZOLE 200 MG PO TABS: 200.0000 mg | ORAL_TABLET | Freq: Every day | ORAL | 0 refills | Status: DC

## 2020-07-08 NOTE — Progress Notes (Signed)
   New Patient Visit  Subjective  Tasha Avila is a 86 y.o. female who presents for the following: Rash (under breast - seems to be getting better but it was raw and very sore.)  The following portions of the chart were reviewed this encounter and updated as appropriate:  Tobacco  Allergies  Meds  Problems  Med Hx  Surg Hx  Fam Hx     Review of Systems:  No other skin or systemic complaints except as noted in HPI or Assessment and Plan.  Objective  Well appearing patient in no apparent distress; mood and affect are within normal limits.  A focused examination was performed including chest. Relevant physical exam findings are noted in the Assessment and Plan.  Objective  Inframammary areas: Erythema   Assessment & Plan  Erythema intertrigo With Candidal intertrigo -severe and symptomatic Inframammary areas  Recommend Zeasorb AF powder daily in the morning.  ketoconazole (NIZORAL) 2 % cream - Inframammary areas  fluconazole (DIFLUCAN) 200 MG tablet - Inframammary areas  Return in about 2 months (around 09/07/2020).  I, Ashok Cordia, CMA, am acting as scribe for Sarina Ser, MD .  Documentation: I have reviewed the above documentation for accuracy and completeness, and I agree with the above.  Sarina Ser, MD

## 2020-07-08 NOTE — Patient Instructions (Signed)
Recommend Zeasorb AF powder daily in the morning.

## 2020-07-08 NOTE — Telephone Encounter (Signed)
Pt daughter called to check on refill status

## 2020-07-10 ENCOUNTER — Encounter: Payer: Self-pay | Admitting: Dermatology

## 2020-07-13 ENCOUNTER — Ambulatory Visit (INDEPENDENT_AMBULATORY_CARE_PROVIDER_SITE_OTHER): Payer: Medicare Other | Admitting: Gastroenterology

## 2020-07-13 ENCOUNTER — Encounter: Payer: Self-pay | Admitting: Gastroenterology

## 2020-07-13 ENCOUNTER — Other Ambulatory Visit: Payer: Self-pay

## 2020-07-13 ENCOUNTER — Telehealth: Payer: Self-pay | Admitting: Internal Medicine

## 2020-07-13 DIAGNOSIS — K625 Hemorrhage of anus and rectum: Secondary | ICD-10-CM

## 2020-07-13 MED ORDER — DULOXETINE HCL 60 MG PO CPEP: 60.0000 mg | ORAL_CAPSULE | Freq: Every day | ORAL | 1 refills | Status: DC

## 2020-07-13 NOTE — Progress Notes (Signed)
Vonda Antigua, MD [address]  [place]  [place], [place] [address]  Main: [phone]  Fax: [phone]   Primary Care Physician: Einar Pheasant, MD   Chief Complaint  Patient presents with  . Rectal Bleeding    Patient has not had rectal bleeding for over a month.    HPI: Tasha Avila is a 86 y.o. female recently hospitalized with rectal bleeding with CT showing thickening extending from the transverse to rectosigmoid colon.  Patient was seen by Dr. Vicente Males and managed conservatively.  Infectious testing was recommended but I do not see that this was done.  Patient presents with her daughter.  Patient is hard of hearing but is able to provide history along with her daughter.  Patient has not had any further blood in stool since hospital discharge.  No abdominal pain or nausea vomiting.  Does report intermittent constipation but does not take anything for it.  Denies any previous colonoscopies.  Current Outpatient Medications  Medication Sig Dispense Refill  . acetaminophen (TYLENOL) 500 MG tablet Take 500 mg by mouth every 6 (six) hours as needed.    . DULoxetine (CYMBALTA) 60 MG capsule Take 1 capsule by mouth once daily 30 capsule 0  . fluconazole (DIFLUCAN) 200 MG tablet Take 1 tablet (200 mg total) by mouth daily. Monday, Wednesday, Friday 3 tablet 0  . Homeopathic Products ([employer] LEG CRAMPS PAIN RELIEF PO) Take by mouth as needed.    Marland Kitchen ketoconazole (NIZORAL) 2 % cream Apply 1 application topically at bedtime. 60 g 2  . levothyroxine (SYNTHROID) 75 MCG tablet Take 1 tablet by mouth once daily 90 tablet 0  . lisinopril (ZESTRIL) 40 MG tablet Take 40 mg by mouth daily.    Marland Kitchen nystatin (MYCOSTATIN/NYSTOP) powder Apply 1 application topically 2 (two) times daily. 30 g 1  . nystatin cream (MYCOSTATIN) Apply 1 application topically 2 (two) times daily. 30 g 0  . pantoprazole (PROTONIX) 40 MG tablet Take 1 tablet by mouth once daily 90 tablet 0  . Propylene Glycol  (SYSTANE BALANCE OP) Apply 1 drop to eye as needed.    . rosuvastatin (CRESTOR) 5 MG tablet Take one tablet q Monday, Wednesday and Friday. 40 tablet 1  . SALINE NASAL SPRAY NA Place into the nose as needed.    . Cyanocobalamin (B-12) 3000 MCG CAPS Take 3,000 mcg by mouth daily. (Patient not taking: Reported on 07/13/2020)    . ferrous sulfate 325 (65 FE) MG EC tablet Take 325 mg by mouth daily with breakfast. (Patient not taking: Reported on 07/13/2020)     No current facility-administered medications for this visit.    Allergies as of 07/13/2020 - Review Complete 07/13/2020  Allergen Reaction Noted  . No known drug allergy  10/09/2012    ROS:  General: Negative for anorexia, weight loss, fever, chills, fatigue, weakness. ENT: Negative for hoarseness, difficulty swallowing , nasal congestion. CV: Negative for chest pain, angina, palpitations, dyspnea on exertion, peripheral edema.  Respiratory: Negative for dyspnea at rest, dyspnea on exertion, cough, sputum, wheezing.  GI: See history of present illness. GU:  Negative for dysuria, hematuria, urinary incontinence, urinary frequency, nocturnal urination.  Endo: Negative for unusual weight change.    Physical Examination:   There were no vitals taken for this visit.  General: Well-nourished, well-developed in no acute distress.  Eyes: No icterus. Conjunctivae pink. Mouth: Oropharyngeal mucosa moist and pink , no lesions erythema or exudate. Neck: Supple, Trachea midline Abdomen: Bowel sounds are  normal, nontender, nondistended, no hepatosplenomegaly or masses, no abdominal bruits or hernia , no rebound or guarding.   Extremities: No lower extremity edema. No clubbing or deformities. Neuro: Alert and oriented x 3.  Grossly intact. Skin: Warm and dry, no jaundice.   Psych: Alert and cooperative, normal mood and affect.   Labs: CMP     Component Value Date/Time   NA 139 06/01/2020 1404   NA 141 11/13/2014 1023   K 4.2  06/01/2020 1404   K 4.1 11/13/2014 1023   CL 102 06/01/2020 1404   CL 104 11/13/2014 1023   CO2 29 06/01/2020 1404   CO2 29 11/13/2014 1023   GLUCOSE 92 06/01/2020 1404   GLUCOSE 105 (H) 11/13/2014 1023   BUN 9 06/01/2020 1404   BUN 15 11/13/2014 1023   CREATININE 0.81 06/01/2020 1404   CREATININE 0.82 09/26/2019 1522   CALCIUM 9.0 06/01/2020 1404   CALCIUM 8.9 11/13/2014 1023   PROT 6.5 05/24/2020 1216   PROT 7.3 11/13/2014 1023   ALBUMIN 3.7 05/24/2020 1216   ALBUMIN 3.6 11/13/2014 1023   AST 16 05/24/2020 1216   AST 16 11/13/2014 1023   ALT 14 05/24/2020 1216   ALT 21 11/13/2014 1023   ALKPHOS 39 05/24/2020 1216   ALKPHOS 61 11/13/2014 1023   BILITOT 1.1 05/24/2020 1216   BILITOT 0.5 11/13/2014 1023   GFRNONAA 54 (L) 05/25/2020 0503   GFRNONAA >60 11/13/2014 1023   GFRNONAA >60 05/08/2014 1000   GFRAA >60 05/25/2020 0503   GFRAA >60 11/13/2014 1023   GFRAA >60 05/08/2014 1000   Lab Results  Component Value Date   WBC 8.5 06/01/2020   HGB 11.9 (L) 06/01/2020   HCT 36.2 06/01/2020   MCV 97.3 06/01/2020   PLT 344.0 06/01/2020    Imaging Studies: No results found.  Assessment and Plan:   Tasha Avila is a 86 y.o. y/o female here for hospital follow-up and CT showing colitis extending from the transverse colon to rectosigmoid colon  Clinical impression during hospital stay was findings were due to infectious or ischemic colitis  Symptoms have completely resolved  Patient has never had a colonoscopy and we discussed this with her and daughter in detail.  Given her advanced age, they would only like to do procedures if absolutely necessary or symptoms are ongoing.  Since symptoms have resolved, they are not interested in the procedure at this time  This is reasonable, if symptoms recur I have advised her to call us  Metamucil daily is recommended for constipation      Dr Vonda Antigua

## 2020-07-13 NOTE — Telephone Encounter (Signed)
Patient went to pharmacy to pick up prescription. Her pharmacy said they never received it, it looks like office tried to send on 07/08/2020,DULoxetine (CYMBALTA) 60 MG capsule

## 2020-07-13 NOTE — Patient Instructions (Signed)
Please take Metamucil daily.   Psyllium granules or powder for solution What is this medicine? PSYLLIUM (SIL i yum) is a bulk-forming fiber laxative. This medicine is used to treat constipation. Increasing fiber in the diet may also help lower cholesterol and promote heart health for some people. This medicine may be used for other purposes; ask your health care provider or pharmacist if you have questions. COMMON BRAND NAME(S): Fiber Therapy, GenFiber, Geri-Mucil, Hydrocil, Konsyl, Metamucil, Metamucil MultiHealth, Mucilin, Natural Fiber Therapy, Reguloid What should I tell my health care provider before I take this medicine? They need to know if you have any of these conditions:  blockage in your bowel  difficulty swallowing  inflammatory bowel disease  phenylketonuria  stomach or intestine problems  sudden change in bowel habits lasting more than 2 weeks  an unusual or allergic reaction to psyllium, other medicines, dyes, or preservatives  pregnant or trying or get pregnant  breast-feeding How should I use this medicine? Mix this medicine into a full glass (240 mL) of water or other cool drink. Take this medicine by mouth. Follow the directions on the package labeling, or take as directed by your health care professional. Take your medicine at regular intervals. Do not take your medicine more often than directed. Talk to your pediatrician regarding the use of this medicine in children. While this drug may be prescribed for children as young as 42 years old for selected conditions, precautions do apply. Overdosage: If you think you have taken too much of this medicine contact a poison control center or emergency room at once. NOTE: This medicine is only for you. Do not share this medicine with others. What if I miss a dose? If you miss a dose, take it as soon as you can. If it is almost time for your next dose, take only that dose. Do not take double or extra doses. What may  interact with this medicine? Interactions are not expected. Take this product at least 2 hours before or after other medicines. This list may not describe all possible interactions. Give your health care provider a list of all the medicines, herbs, non-prescription drugs, or dietary supplements you use. Also tell them if you smoke, drink alcohol, or use illegal drugs. Some items may interact with your medicine. What should I watch for while using this medicine? Check with your doctor or health care professional if your symptoms do not start to get better or if they get worse. Stop using this medicine and contact your doctor or health care professional if you have rectal bleeding or if you have to treat your constipation for more than 1 week. These could be signs of a more serious condition. Drink several glasses of water a day while you are taking this medicine. This will help to relieve constipation and prevent dehydration. What side effects may I notice from receiving this medicine? Side effects that you should report to your doctor or health care professional as soon as possible:  allergic reactions like skin rash, itching or hives, swelling of the face, lips, or tongue  breathing problems  chest pain  nausea, vomiting  rectal bleeding  trouble swallowing Side effects that usually do not require medical attention (report to your doctor or health care professional if they continue or are bothersome):  bloating  gas  stomach cramps This list may not describe all possible side effects. Call your doctor for medical advice about side effects. You may report side effects to FDA at 1-800-FDA-1088.  Where should I keep my medicine? Keep out of the reach of children. Store at room temperature between 15 and 30 degrees C (59 and 86 degrees F). Protect from moisture. Throw away any unused medicine after the expiration date. NOTE: This sheet is a summary. It may not cover all possible  information. If you have questions about this medicine, talk to your doctor, pharmacist, or health care provider.  2020 Elsevier/Gold Standard (2018-04-16 15:41:08)

## 2020-07-28 DIAGNOSIS — E079 Disorder of thyroid, unspecified: Secondary | ICD-10-CM | POA: Diagnosis not present

## 2020-07-28 DIAGNOSIS — E7800 Pure hypercholesterolemia, unspecified: Secondary | ICD-10-CM | POA: Diagnosis not present

## 2020-07-28 DIAGNOSIS — R1310 Dysphagia, unspecified: Secondary | ICD-10-CM | POA: Diagnosis not present

## 2020-07-28 DIAGNOSIS — R52 Pain, unspecified: Secondary | ICD-10-CM | POA: Diagnosis not present

## 2020-07-28 DIAGNOSIS — M170 Bilateral primary osteoarthritis of knee: Secondary | ICD-10-CM | POA: Diagnosis not present

## 2020-07-28 DIAGNOSIS — K219 Gastro-esophageal reflux disease without esophagitis: Secondary | ICD-10-CM | POA: Diagnosis not present

## 2020-07-28 DIAGNOSIS — H6123 Impacted cerumen, bilateral: Secondary | ICD-10-CM | POA: Diagnosis not present

## 2020-07-28 DIAGNOSIS — I10 Essential (primary) hypertension: Secondary | ICD-10-CM | POA: Diagnosis not present

## 2020-08-17 ENCOUNTER — Ambulatory Visit (INDEPENDENT_AMBULATORY_CARE_PROVIDER_SITE_OTHER): Payer: Medicare Other

## 2020-08-17 VITALS — Ht 61.0 in | Wt 130.0 lb

## 2020-08-17 DIAGNOSIS — Z0000 Encounter for general adult medical examination without abnormal findings: Secondary | ICD-10-CM

## 2020-08-17 NOTE — Patient Instructions (Addendum)
Tasha Avila , Thank you for taking time to come for your Medicare Wellness Visit. I appreciate your ongoing commitment to your health goals. Please review the following plan we discussed and let me know if I can assist you in the future.   These are the goals we discussed: Goals    . Maintain Healthy Lifestyle        This is a list of the screening recommended for you and due dates:  Health Maintenance  Topic Date Due  . COVID-19 Vaccine (1) 09/02/2020*  . DEXA scan (bone density measurement)  03/18/2025*  . Tetanus Vaccine  03/28/2028  . Flu Shot  Discontinued  . Pneumonia vaccines  Discontinued  *Topic was postponed. The date shown is not the original due date.    Immunizations Immunization History  Administered Date(s) Administered  . Tdap 03/28/2018   Keep all routine maintenance appointments.   Follow up 09/02/20 @ 3:00  Advanced directives: yes, on file  Conditions/risks identified: none new  Follow up in one year for your annual wellness visit.   Preventive Care 75 Years and Older, Female Preventive care refers to lifestyle choices and visits with your health care provider that can promote health and wellness. What does preventive care include?  A yearly physical exam. This is also called an annual well check.  Dental exams once or twice a year.  Routine eye exams. Ask your health care provider how often you should have your eyes checked.  Personal lifestyle choices, including:  Daily care of your teeth and gums.  Regular physical activity.  Eating a healthy diet.  Avoiding tobacco and drug use.  Limiting alcohol use.  Practicing safe sex.  Taking low-dose aspirin every day.  Taking vitamin and mineral supplements as recommended by your health care provider. What happens during an annual well check? The services and screenings done by your health care provider during your annual well check will depend on your age, overall health, lifestyle risk  factors, and family history of disease. Counseling  Your health care provider may ask you questions about your:  Alcohol use.  Tobacco use.  Drug use.  Emotional well-being.  Home and relationship well-being.  Sexual activity.  Eating habits.  History of falls.  Memory and ability to understand (cognition).  Work and work Statistician.  Reproductive health. Screening  You may have the following tests or measurements:  Height, weight, and BMI.  Blood pressure.  Lipid and cholesterol levels. These may be checked every 5 years, or more frequently if you are over 49 years old.  Skin check.  Lung cancer screening. You may have this screening every year starting at age 32 if you have a 30-pack-year history of smoking and currently smoke or have quit within the past 15 years.  Fecal occult blood test (FOBT) of the stool. You may have this test every year starting at age 63.  Flexible sigmoidoscopy or colonoscopy. You may have a sigmoidoscopy every 5 years or a colonoscopy every 10 years starting at age 60.  Hepatitis C blood test.  Hepatitis B blood test.  Sexually transmitted disease (STD) testing.  Diabetes screening. This is done by checking your blood sugar (glucose) after you have not eaten for a while (fasting). You may have this done every 1-3 years.  Bone density scan. This is done to screen for osteoporosis. You may have this done starting at age 74.  Mammogram. This may be done every 1-2 years. Talk to your health care provider  about how often you should have regular mammograms. Talk with your health care provider about your test results, treatment options, and if necessary, the need for more tests. Vaccines  Your health care provider may recommend certain vaccines, such as:  Influenza vaccine. This is recommended every year.  Tetanus, diphtheria, and acellular pertussis (Tdap, Td) vaccine. You may need a Td booster every 10 years.  Zoster vaccine. You  may need this after age 11.  Pneumococcal 13-valent conjugate (PCV13) vaccine. One dose is recommended after age 53.  Pneumococcal polysaccharide (PPSV23) vaccine. One dose is recommended after age 36. Talk to your health care provider about which screenings and vaccines you need and how often you need them. This information is not intended to replace advice given to you by your health care provider. Make sure you discuss any questions you have with your health care provider. Document Released: 12/17/2015 Document Revised: 08/09/2016 Document Reviewed: 09/21/2015 Elsevier Interactive Patient Education  2017 [employer] Prevention in the Home Falls can cause injuries. They can happen to people of all ages. There are many things you can do to make your home safe and to help prevent falls. What can I do on the outside of my home?  Regularly fix the edges of walkways and driveways and fix any cracks.  Remove anything that might make you trip as you walk through a door, such as a raised step or threshold.  Trim any bushes or trees on the path to your home.  Use bright outdoor lighting.  Clear any walking paths of anything that might make someone trip, such as rocks or tools.  Regularly check to see if handrails are loose or broken. Make sure that both sides of any steps have handrails.  Any raised decks and porches should have guardrails on the edges.  Have any leaves, snow, or ice cleared regularly.  Use sand or salt on walking paths during winter.  Clean up any spills in your garage right away. This includes oil or grease spills. What can I do in the bathroom?  Use night lights.  Install grab bars by the toilet and in the tub and shower. Do not use towel bars as grab bars.  Use non-skid mats or decals in the tub or shower.  If you need to sit down in the shower, use a plastic, non-slip stool.  Keep the floor dry. Clean up any water that spills on the floor as soon as  it happens.  Remove soap buildup in the tub or shower regularly.  Attach bath mats securely with double-sided non-slip rug tape.  Do not have throw rugs and other things on the floor that can make you trip. What can I do in the bedroom?  Use night lights.  Make sure that you have a light by your bed that is easy to reach.  Do not use any sheets or blankets that are too big for your bed. They should not hang down onto the floor.  Have a firm chair that has side arms. You can use this for support while you get dressed.  Do not have throw rugs and other things on the floor that can make you trip. What can I do in the kitchen?  Clean up any spills right away.  Avoid walking on wet floors.  Keep items that you use a lot in easy-to-reach places.  If you need to reach something above you, use a strong step stool that has a grab bar.  Keep electrical cords out of the way.  Do not use floor polish or wax that makes floors slippery. If you must use wax, use non-skid floor wax.  Do not have throw rugs and other things on the floor that can make you trip. What can I do with my stairs?  Do not leave any items on the stairs.  Make sure that there are handrails on both sides of the stairs and use them. Fix handrails that are broken or loose. Make sure that handrails are as long as the stairways.  Check any carpeting to make sure that it is firmly attached to the stairs. Fix any carpet that is loose or worn.  Avoid having throw rugs at the top or bottom of the stairs. If you do have throw rugs, attach them to the floor with carpet tape.  Make sure that you have a light switch at the top of the stairs and the bottom of the stairs. If you do not have them, ask someone to add them for you. What else can I do to help prevent falls?  Wear shoes that:  Do not have high heels.  Have rubber bottoms.  Are comfortable and fit you well.  Are closed at the toe. Do not wear sandals.  If  you use a stepladder:  Make sure that it is fully opened. Do not climb a closed stepladder.  Make sure that both sides of the stepladder are locked into place.  Ask someone to hold it for you, if possible.  Clearly mark and make sure that you can see:  Any grab bars or handrails.  First and last steps.  Where the edge of each step is.  Use tools that help you move around (mobility aids) if they are needed. These include:  Canes.  Walkers.  Scooters.  Crutches.  Turn on the lights when you go into a dark area. Replace any light bulbs as soon as they burn out.  Set up your furniture so you have a clear path. Avoid moving your furniture around.  If any of your floors are uneven, fix them.  If there are any pets around you, be aware of where they are.  Review your medicines with your doctor. Some medicines can make you feel dizzy. This can increase your chance of falling. Ask your doctor what other things that you can do to help prevent falls. This information is not intended to replace advice given to you by your health care provider. Make sure you discuss any questions you have with your health care provider. Document Released: 09/16/2009 Document Revised: 04/27/2016 Document Reviewed: 12/25/2014 Elsevier Interactive Patient Education  2017 [employer].

## 2020-08-17 NOTE — Progress Notes (Addendum)
Subjective:   Tasha Avila is a 86 y.o. female who presents for Medicare Annual (Subsequent) preventive examination.  Review of Systems    No ROS.  Medicare Wellness Virtual Visit.     Cardiac Risk Factors include: advanced age (>33men, >23 women);hypertension     Objective:    Today's Vitals   08/17/20 1033  Weight: 130 lb (59 kg)  Height: 5\' 1"  (1.549 m)   Body mass index is 24.56 kg/m.  Advanced Directives 08/17/2020 05/24/2020 05/24/2020 08/08/2018 12/22/2015 06/01/2015  Does Patient Have a Medical Advance Directive? Yes Yes No No No No  Type of Paramedic of [place];Living will [place]  Does patient want to make changes to medical advance directive? No - Patient declined No - Patient declined - - - -  Copy of [place] in Chart? Yes - validated most recent copy scanned in chart (See row information) No - copy requested - - - -  Would patient like information on creating a medical advance directive? - No - Patient declined No - Patient declined Yes (MAU/Ambulatory/Procedural Areas - Information given) - -    Current Medications (verified) Outpatient Encounter Medications as of 08/17/2020  Medication Sig  . acetaminophen (TYLENOL) 500 MG tablet Take 500 mg by mouth every 6 (six) hours as needed.  . Cyanocobalamin (B-12) 3000 MCG CAPS Take 3,000 mcg by mouth daily. (Patient not taking: Reported on 07/13/2020)  . DULoxetine (CYMBALTA) 60 MG capsule Take 1 capsule (60 mg total) by mouth daily.  . ferrous sulfate 325 (65 FE) MG EC tablet Take 325 mg by mouth daily with breakfast. (Patient not taking: Reported on 07/13/2020)  . fluconazole (DIFLUCAN) 200 MG tablet Take 1 tablet (200 mg total) by mouth daily. Monday, Wednesday, Friday  . Homeopathic Products ([facility] LEG CRAMPS PAIN RELIEF PO) Take by mouth as needed.  Marland Kitchen ketoconazole (NIZORAL) 2 % cream Apply 1 application topically at bedtime.  Marland Kitchen levothyroxine  (SYNTHROID) 75 MCG tablet Take 1 tablet by mouth once daily  . lisinopril (ZESTRIL) 40 MG tablet Take 40 mg by mouth daily.  Marland Kitchen nystatin (MYCOSTATIN/NYSTOP) powder Apply 1 application topically 2 (two) times daily.  Marland Kitchen nystatin cream (MYCOSTATIN) Apply 1 application topically 2 (two) times daily.  . pantoprazole (PROTONIX) 40 MG tablet Take 1 tablet by mouth once daily  . Propylene Glycol (SYSTANE BALANCE OP) Apply 1 drop to eye as needed.  . rosuvastatin (CRESTOR) 5 MG tablet Take one tablet q Monday, Wednesday and Friday.  Marland Kitchen SALINE NASAL SPRAY NA Place into the nose as needed.   No facility-administered encounter medications on file as of 08/17/2020.    Allergies (verified) No known drug allergy   History: Past Medical History:  Diagnosis Date  . Anemia   . Arthritis   . GERD (gastroesophageal reflux disease)   . Hypercholesterolemia   . Hypertension   . Hypothyroidism    Past Surgical History:  Procedure Laterality Date  . ABDOMINAL HYSTERECTOMY  1960s   bladder tack at the same time, ovaries not removed  . LAPAROSCOPIC CHOLECYSTECTOMY  5/11   Family History  Problem Relation Age of Onset  . Lymphoma Brother   . Stroke Brother   . Diabetes Brother   . Diabetes Brother        x2  . Stroke Brother   . Thyroid disease Brother   . Stroke Mother    Social History   Socioeconomic History  .  Marital status: Widowed    Spouse name: Not on file  . Number of children: 2  . Years of education: Not on file  . Highest education level: Not on file  Occupational History  . Not on file  Tobacco Use  . Smoking status: Never Smoker  . Smokeless tobacco: Never Used  Vaping Use  . Vaping Use: Never used  Substance and Sexual Activity  . Alcohol use: No    Alcohol/week: 0.0 standard drinks  . Drug use: No  . Sexual activity: Not on file  Other Topics Concern  . Not on file  Social History Narrative  . Not on file   Social Determinants of Health   Financial Resource  Strain: Low Risk   . Difficulty of Paying Living Expenses: Not hard at all  Food Insecurity: No Food Insecurity  . Worried About Charity fundraiser in the Last Year: Never true  . Ran Out of Food in the Last Year: Never true  Transportation Needs: No Transportation Needs  . Lack of Transportation (Medical): No  . Lack of Transportation (Non-Medical): No  Physical Activity:   . Days of Exercise per Week: Not on file  . Minutes of Exercise per Session: Not on file  Stress: No Stress Concern Present  . Feeling of Stress : Not at all  Social Connections:   . Frequency of Communication with Friends and Family: Not on file  . Frequency of Social Gatherings with Friends and Family: Not on file  . Attends Religious Services: Not on file  . Active Member of Clubs or Organizations: Not on file  . Attends Archivist Meetings: Not on file  . Marital Status: Not on file    Tobacco Counseling Counseling given: Not Answered   Clinical Intake:  Pre-visit preparation completed: Yes        Diabetes: No  How often do you need to have someone help you when you read instructions, pamphlets, or other written materials from your doctor or pharmacy?: 1 - Never    Interpreter Needed?: No      Activities of Daily Living In your present state of health, do you have any difficulty performing the following activities: 08/17/2020 05/24/2020  Hearing? Y N  Vision? N N  Difficulty concentrating or making decisions? N N  Walking or climbing stairs? Y Y  Comment Unsteady gait. Cane in use. -  Dressing or bathing? Y N  Comment She paces herself with dressing -  Doing errands, shopping? Y N  Comment She does not drive. -  Preparing Food and eating ? N -  Using the Toilet? N -  In the past six months, have you accidently leaked urine? N -  Do you have problems with loss of bowel control? N -  Managing your Medications? Y -  Comment Daughter assist as needed -  Managing your  Finances? Y -  Comment Daughter assist as needed -  Housekeeping or managing your Housekeeping? Y -  Comment Daughter assist as needed -  Some recent data might be hidden    Patient Care Team: Einar Pheasant, MD as PCP - General (Internal Medicine) Einar Pheasant, MD (Internal Medicine)  Indicate any recent Medical Services you may have received from other than [facility] providers in the past year (date may be approximate).     Assessment:   This is a routine wellness examination for Desire.  I connected with Yanis today by telephone and verified that I am speaking with  the correct person using two identifiers. Location patient: home Location provider: work Persons participating in the virtual visit: patient, Marine scientist.    I discussed the limitations, risks, security and privacy concerns of performing an evaluation and management service by telephone and the availability of in person appointments. The patient expressed understanding and verbally consented to this telephonic visit.    Interactive audio and video telecommunications were attempted between this provider and patient, however failed, due to patient having technical difficulties OR patient did not have access to video capability.  We continued and completed visit with audio only.  Some vital signs may be absent or patient reported.   Hearing/Vision screen  Hearing Screening   125Hz  250Hz  500Hz  1000Hz  2000Hz  3000Hz  4000Hz  6000Hz  8000Hz   Right ear:           Left ear:           Comments: Hearing aid, bilateral   Vision Screening Comments: Wears corrective lenses Visual acuity not assessed, virtual visit.  They have seen their ophthalmologist in the last 12 months.    Dietary issues and exercise activities discussed: Current Exercise Habits: The patient does not participate in regular exercise at present Healthy diet Good fluid intake; plans to add more water   Goals    . Maintain Healthy Lifestyle       Depression  Screen PHQ 2/9 Scores 08/17/2020 12/16/2019 09/26/2019 08/15/2019 08/08/2018 03/27/2017 03/27/2017  PHQ - 2 Score 0 0 - 0 1 0 0  PHQ- 9 Score - 0 - - - 4 -  Exception Documentation - - Other- indicate reason in comment box - - - -  Not completed - - discussed with pcp. - - - -    Fall Risk Fall Risk  08/17/2020 06/01/2020 08/15/2019 08/15/2019 08/08/2018  Falls in the past year? 0 1 0 0 No  Number falls in past yr: 0 1 - - -  Injury with Fall? - 1 - - -  Risk for fall due to : - History of fall(s) - - -  Risk for fall due to: Comment - - - - -  Follow up Falls evaluation completed Falls evaluation completed Falls evaluation completed - -   Handrails in use when climbing stairs? Yes Home free of loose throw rugs in walkways, pet beds, electrical cords, etc? Yes  Adequate lighting in your home to reduce risk of falls? Yes   ASSISTIVE DEVICES UTILIZED TO PREVENT FALLS:  Life alert? Yes  Use of a cane, walker or w/c? Yes  Grab bars in the bathroom? No  Shower chair or bench in shower? No  Elevated toilet seat or a handicapped toilet? Yes   TIMED UP AND GO:  Was the test performed? No . Virtual visit.    Cognitive Function:     6CIT Screen 08/08/2018  What Year? 0 points  What month? 0 points  What time? 0 points  Count back from 20 0 points  Months in reverse 2 points  Repeat phrase 2 points  Total Score 4    Immunizations Immunization History  Administered Date(s) Administered  . Tdap 03/28/2018    Health Maintenance Health Maintenance  Topic Date Due  . COVID-19 Vaccine (1) 09/02/2020 (Originally 09/23/1940)  . DEXA SCAN  03/18/2025 (Originally 09/24/1995)  . TETANUS/TDAP  03/28/2028  . INFLUENZA VACCINE  Discontinued  . PNA vac Low Risk Adult  Discontinued    Dental Screening: Recommended annual dental exams for proper oral hygiene  Community Resource Referral /  Chronic Care Management: CRR required this visit?  No   CCM required this visit?  No      Plan:    Keep all routine maintenance appointments.   Follow up 09/02/20 @ 3:00  I have personally reviewed and noted the following in the patient's chart:   . Medical and social history . Use of alcohol, tobacco or illicit drugs  . Current medications and supplements . Functional ability and status . Nutritional status . Physical activity . Advanced directives . List of other physicians . Hospitalizations, surgeries, and ER visits in previous 12 months . Vitals . Screenings to include cognitive, depression, and falls . Referrals and appointments  In addition, I have reviewed and discussed with patient certain preventive protocols, quality metrics, and best practice recommendations. A written personalized care plan for preventive services as well as general preventive health recommendations were provided to patient via mail     Varney Biles, LPN   [id number]    Reviewed above information.  Agree with assessment and plna.   Dr Nicki Reaper

## 2020-09-02 ENCOUNTER — Ambulatory Visit (INDEPENDENT_AMBULATORY_CARE_PROVIDER_SITE_OTHER): Payer: Medicare Other | Admitting: Internal Medicine

## 2020-09-02 ENCOUNTER — Encounter: Payer: Self-pay | Admitting: Internal Medicine

## 2020-09-02 ENCOUNTER — Other Ambulatory Visit: Payer: Self-pay

## 2020-09-02 VITALS — BP 142/78 | HR 76 | Temp 98.0°F | Resp 16 | Ht 61.0 in | Wt 131.6 lb

## 2020-09-02 DIAGNOSIS — R079 Chest pain, unspecified: Secondary | ICD-10-CM

## 2020-09-02 DIAGNOSIS — I10 Essential (primary) hypertension: Secondary | ICD-10-CM

## 2020-09-02 DIAGNOSIS — E039 Hypothyroidism, unspecified: Secondary | ICD-10-CM

## 2020-09-02 DIAGNOSIS — N2889 Other specified disorders of kidney and ureter: Secondary | ICD-10-CM

## 2020-09-02 DIAGNOSIS — K219 Gastro-esophageal reflux disease without esophagitis: Secondary | ICD-10-CM | POA: Diagnosis not present

## 2020-09-02 DIAGNOSIS — E7800 Pure hypercholesterolemia, unspecified: Secondary | ICD-10-CM

## 2020-09-02 DIAGNOSIS — R350 Frequency of micturition: Secondary | ICD-10-CM

## 2020-09-02 DIAGNOSIS — D649 Anemia, unspecified: Secondary | ICD-10-CM

## 2020-09-02 DIAGNOSIS — C8220 Follicular lymphoma grade III, unspecified, unspecified site: Secondary | ICD-10-CM | POA: Diagnosis not present

## 2020-09-02 DIAGNOSIS — C8590 Non-Hodgkin lymphoma, unspecified, unspecified site: Secondary | ICD-10-CM | POA: Diagnosis not present

## 2020-09-02 DIAGNOSIS — R739 Hyperglycemia, unspecified: Secondary | ICD-10-CM

## 2020-09-02 DIAGNOSIS — F32A Depression, unspecified: Secondary | ICD-10-CM

## 2020-09-02 DIAGNOSIS — F320 Major depressive disorder, single episode, mild: Secondary | ICD-10-CM | POA: Diagnosis not present

## 2020-09-02 NOTE — Progress Notes (Signed)
Patient ID: Marshella Tello, female   DOB: 1928-08-31, 86 y.o.   MRN: [id number]   Subjective:    Patient ID: [name], female    DOB: Aug 15, 1928, 86 y.o.   MRN: [id number]  HPI This visit occurred during the SARS-CoV-2 public health emergency.  Safety protocols were in place, including screening questions prior to the visit, additional usage of staff PPE, and extensive cleaning of exam room while observing appropriate contact time as indicated for disinfecting solutions.  Patient here for a scheduled follow up.  She is accompanied by her daughter.  History obtained from both of them.  Here to follow up regarding her blood pressure, cholesterol and arthritis pain.  Sees cardiology.  Evaluated 07/28/20 - stable.  No changes made.  Blood pressure ok.  Has continued varied msk complaints.  Takes tylenol.  Also on cymbalta.  Overall appears to be stable.  Breathing stable.  No increased cough or congestion.  No increased acid reflux.  No increased abdominal pain.  Bowels moving.  Overall feels things are stable.    Past Medical History:  Diagnosis Date  . Anemia   . Arthritis   . GERD (gastroesophageal reflux disease)   . Hypercholesterolemia   . Hypertension   . Hypothyroidism    Past Surgical History:  Procedure Laterality Date  . ABDOMINAL HYSTERECTOMY  1960s   bladder tack at the same time, ovaries not removed  . LAPAROSCOPIC CHOLECYSTECTOMY  5/11   Family History  Problem Relation Age of Onset  . Lymphoma Brother   . Stroke Brother   . Diabetes Brother   . Diabetes Brother        x2  . Stroke Brother   . Thyroid disease Brother   . Stroke Mother    Social History   Socioeconomic History  . Marital status: Widowed    Spouse name: Not on file  . Number of children: 2  . Years of education: Not on file  . Highest education level: Not on file  Occupational History  . Not on file  Tobacco Use  . Smoking status: Never Smoker  . Smokeless tobacco: Never Used    Vaping Use  . Vaping Use: Never used  Substance and Sexual Activity  . Alcohol use: No    Alcohol/week: 0.0 standard drinks  . Drug use: No  . Sexual activity: Not on file  Other Topics Concern  . Not on file  Social History Narrative  . Not on file   Social Determinants of Health   Financial Resource Strain: Low Risk   . Difficulty of Paying Living Expenses: Not hard at all  Food Insecurity: No Food Insecurity  . Worried About Charity fundraiser in the Last Year: Never true  . Ran Out of Food in the Last Year: Never true  Transportation Needs: No Transportation Needs  . Lack of Transportation (Medical): No  . Lack of Transportation (Non-Medical): No  Physical Activity:   . Days of Exercise per Week: Not on file  . Minutes of Exercise per Session: Not on file  Stress: No Stress Concern Present  . Feeling of Stress : Not at all  Social Connections:   . Frequency of Communication with Friends and Family: Not on file  . Frequency of Social Gatherings with Friends and Family: Not on file  . Attends Religious Services: Not on file  . Active Member of Clubs or Organizations: Not on file  . Attends Archivist Meetings: Not on  file  . Marital Status: Not on file    Outpatient Encounter Medications as of 09/02/2020  Medication Sig  . acetaminophen (TYLENOL) 500 MG tablet Take 500 mg by mouth every 6 (six) hours as needed.  . Cyanocobalamin (B-12) 3000 MCG CAPS Take 3,000 mcg by mouth daily. (Patient not taking: Reported on 07/13/2020)  . DULoxetine (CYMBALTA) 60 MG capsule Take 1 capsule (60 mg total) by mouth daily.  . ferrous sulfate 325 (65 FE) MG EC tablet Take 325 mg by mouth daily with breakfast. (Patient not taking: Reported on 07/13/2020)  . Homeopathic Products ([employer] LEG CRAMPS PAIN RELIEF PO) Take by mouth as needed.  Marland Kitchen levothyroxine (SYNTHROID) 75 MCG tablet Take 1 tablet by mouth once daily  . pantoprazole (PROTONIX) 40 MG tablet Take 1 tablet by mouth once  daily  . Propylene Glycol (SYSTANE BALANCE OP) Apply 1 drop to eye as needed.  . rosuvastatin (CRESTOR) 5 MG tablet Take one tablet q Monday, Wednesday and Friday.  Marland Kitchen SALINE NASAL SPRAY NA Place into the nose as needed.  . [DISCONTINUED] fluconazole (DIFLUCAN) 200 MG tablet Take 1 tablet (200 mg total) by mouth daily. Monday, Wednesday, Friday  . [DISCONTINUED] lisinopril (ZESTRIL) 40 MG tablet Take 40 mg by mouth daily.  . [DISCONTINUED] nystatin (MYCOSTATIN/NYSTOP) powder Apply 1 application topically 2 (two) times daily.  . [DISCONTINUED] nystatin cream (MYCOSTATIN) Apply 1 application topically 2 (two) times daily.   No facility-administered encounter medications on file as of 09/02/2020.    Review of Systems  Constitutional: Negative for appetite change and unexpected weight change.  HENT: Negative for congestion and sinus pressure.   Respiratory: Negative for cough, chest tightness and shortness of breath.   Cardiovascular: Negative for chest pain, palpitations and leg swelling.  Gastrointestinal: Negative for abdominal pain, diarrhea and nausea.  Genitourinary: Negative for difficulty urinating.       Some increased urinary frequency.  Wants urine checked to confirm no infection.   Musculoskeletal: Negative for joint swelling.       Varied joint pains and back pain as outlined.    Skin: Negative for color change and rash.  Neurological: Negative for dizziness, light-headedness and headaches.  Psychiatric/Behavioral: Negative for agitation and dysphoric mood.       Objective:    Physical Exam Vitals reviewed.  Constitutional:      General: She is not in acute distress.    Appearance: Normal appearance.  HENT:     Head: Normocephalic and atraumatic.     Right Ear: External ear normal.     Left Ear: External ear normal.  Neck:     Thyroid: No thyromegaly.  Cardiovascular:     Rate and Rhythm: Normal rate and regular rhythm.  Pulmonary:     Effort: No respiratory  distress.     Breath sounds: Normal breath sounds. No wheezing.  Abdominal:     General: Bowel sounds are normal.     Palpations: Abdomen is soft.     Tenderness: There is no abdominal tenderness.  Musculoskeletal:        General: No swelling or tenderness.     Cervical back: Neck supple. No tenderness.  Lymphadenopathy:     Cervical: No cervical adenopathy.  Skin:    Findings: No erythema or rash.  Neurological:     Mental Status: She is alert.  Psychiatric:        Mood and Affect: Mood normal.        Behavior: Behavior normal.  BP (!) 142/78   Pulse 76   Temp 98 F (36.7 C) (Oral)   Resp 16   Ht '5\' 1"'  (1.549 m)   Wt 131 lb 9.6 oz (59.7 kg)   SpO2 98%   BMI 24.87 kg/m  Wt Readings from Last 3 Encounters:  09/02/20 131 lb 9.6 oz (59.7 kg)  08/17/20 130 lb (59 kg)  06/01/20 130 lb 3.2 oz (59.1 kg)     Lab Results  Component Value Date   WBC 8.5 06/01/2020   HGB 11.9 (L) 06/01/2020   HCT 36.2 06/01/2020   PLT 344.0 06/01/2020   GLUCOSE 92 06/01/2020   CHOL 234 (H) 01/01/2020   TRIG 103.0 01/01/2020   HDL 71.70 01/01/2020   LDLDIRECT 123.3 10/24/2012   LDLCALC 141 (H) 01/01/2020   ALT 14 05/24/2020   AST 16 05/24/2020   NA 139 06/01/2020   K 4.2 06/01/2020   CL 102 06/01/2020   CREATININE 0.81 06/01/2020   BUN 9 06/01/2020   CO2 29 06/01/2020   TSH 0.81 09/26/2019   INR 1.1 05/24/2020   HGBA1C 5.7 01/01/2020    CT ABDOMEN PELVIS WO CONTRAST  Result Date: 05/24/2020 CLINICAL DATA:  Abdominal distension and rectal bleeding. EXAM: CT ABDOMEN AND PELVIS WITHOUT CONTRAST TECHNIQUE: Multidetector CT imaging of the abdomen and pelvis was performed following the standard protocol without IV contrast. COMPARISON:  CT abdomen and pelvis 07/28/2018. FINDINGS: Lower chest: Lung bases clear.  No pleural or pericardial effusion. Hepatobiliary: Status post cholecystectomy. Punctate calcification in the right hepatic lobe is noted and unchanged. The liver is otherwise  unremarkable. Biliary tree is also unremarkable. Pancreas: Unremarkable. No pancreatic ductal dilatation or surrounding inflammatory changes. Spleen: Normal in size without focal abnormality. Adrenals/Urinary Tract: Adrenal glands are unremarkable. Kidneys are normal, without renal calculi, solid lesion, or hydronephrosis. Small right renal cyst incidentally noted. Bladder is unremarkable. Stomach/Bowel: The walls of the transverse, descending and rectosigmoid colon are thickened with surrounding stranding consistent with colitis. Colon is largely decompressed. No pneumatosis, portal venous gas, free air or fluid collection. Small hiatal hernia is noted. The stomach is otherwise unremarkable. Small bowel and appendix appear normal. Vascular/Lymphatic: Aortic atherosclerosis. No enlarged abdominal or pelvic lymph nodes. Reproductive: Status post hysterectomy. No adnexal masses. Other: The patient is status post ventral hernia repair. Musculoskeletal: No acute bony abnormality. The patient is status post vertebral augmentation for compression fracture of L1. There is convex left scoliosis and multilevel lumbar degenerative change. IMPRESSION: Findings consistent with infectious or inflammatory colitis of the transverse through rectosigmoid colon. No CT signs of ischemia. Small hiatal hernia. Aortic Atherosclerosis (ICD10-I70.0). Electronically Signed   By: Inge Rise M.D.   On: 05/24/2020 11:04       Assessment & Plan:   Problem List Items Addressed This Visit    Urinary frequency - Primary    Check urine to confirm no infection.       Relevant Orders   Urinalysis, Routine w reflex microscopic (Completed)   Urine Culture (Completed)   Renal mass    Previously evaluated by urology and nephrology.  Declines further evaluation or testing.       Pure hypercholesterolemia    Follow lipid panel.       Non-Hodgkin's lymphoma [facility])    Has been evaluated by oncology.  Declines further evaluation,  testing or w/up.        Mild depression ([place])    On cymbalta.  Stable.       Lymphoma ([place]) (  Chronic)    Previously saw oncology.  Has declined further w/up and evaluation.        Hypothyroidism    On thyroid replacement.  Follow tsh.        Hypertension    Blood pressure as outlined.  On lisinipril.  Continue current medication.  Hold on making changes.  Follow pressures.  Follow metabolic panel.       Hyperglycemia    Low carb diet and exercise.  Follow met b and a1c.       Hypercholesterolemia    Low cholesterol diet and exercise.  Follow lipid panel.       GERD (gastroesophageal reflux disease)    On protonix.  Stable.  Follow.        Chest pain    Saw cardiology recently in follow up for intermittent chest pain.  No further w/up felt warranted.  Follow.        Anemia    Follow cbc.            Einar Pheasant, MD

## 2020-09-03 LAB — URINE CULTURE
MICRO NUMBER:: 11015769
SPECIMEN QUALITY:: ADEQUATE

## 2020-09-03 LAB — URINALYSIS, ROUTINE W REFLEX MICROSCOPIC
Bilirubin Urine: NEGATIVE
Hgb urine dipstick: NEGATIVE
Ketones, ur: NEGATIVE
Leukocytes,Ua: NEGATIVE
Nitrite: NEGATIVE
Specific Gravity, Urine: 1.02 (ref 1.000–1.030)
Total Protein, Urine: NEGATIVE
Urine Glucose: NEGATIVE
Urobilinogen, UA: 0.2 (ref 0.0–1.0)
pH: 6 (ref 5.0–8.0)

## 2020-09-06 ENCOUNTER — Other Ambulatory Visit: Payer: Self-pay | Admitting: *Deleted

## 2020-09-06 ENCOUNTER — Telehealth: Payer: Medicare Other | Admitting: Gastroenterology

## 2020-09-06 MED ORDER — LISINOPRIL 40 MG PO TABS: 40.0000 mg | ORAL_TABLET | Freq: Every day | ORAL | 5 refills | Status: DC

## 2020-09-12 ENCOUNTER — Encounter: Payer: Self-pay | Admitting: Internal Medicine

## 2020-09-12 NOTE — Assessment & Plan Note (Signed)
Follow lipid panel.   

## 2020-09-12 NOTE — Assessment & Plan Note (Signed)
Previously saw oncology.  Has declined further w/up and evaluation.  

## 2020-09-12 NOTE — Assessment & Plan Note (Signed)
Has been evaluated by oncology.  Declines further evaluation, testing or w/up.

## 2020-09-12 NOTE — Assessment & Plan Note (Signed)
On protonix.  Stable.  Follow.

## 2020-09-12 NOTE — Assessment & Plan Note (Signed)
Follow cbc.  

## 2020-09-12 NOTE — Assessment & Plan Note (Signed)
Previously evaluated by urology and nephrology.  Declines further evaluation or testing.

## 2020-09-12 NOTE — Assessment & Plan Note (Signed)
On cymbalta.  Stable.  

## 2020-09-12 NOTE — Assessment & Plan Note (Signed)
Low carb diet and exercise.  Follow met b and a1c.  

## 2020-09-12 NOTE — Assessment & Plan Note (Signed)
Low cholesterol diet and exercise.  Follow lipid panel.   

## 2020-09-12 NOTE — Assessment & Plan Note (Signed)
Check urine to confirm no infection.  

## 2020-09-12 NOTE — Assessment & Plan Note (Signed)
Blood pressure as outlined.  On lisinipril.  Continue current medication.  Hold on making changes.  Follow pressures.  Follow metabolic panel.

## 2020-09-12 NOTE — Assessment & Plan Note (Signed)
Saw cardiology recently in follow up for intermittent chest pain.  No further w/up felt warranted.  Follow.

## 2020-09-12 NOTE — Assessment & Plan Note (Signed)
On thyroid replacement.  Follow tsh.  

## 2020-09-16 ENCOUNTER — Other Ambulatory Visit: Payer: Self-pay | Admitting: Internal Medicine

## 2020-09-23 ENCOUNTER — Other Ambulatory Visit: Payer: Self-pay | Admitting: Internal Medicine

## 2020-09-27 ENCOUNTER — Ambulatory Visit: Payer: Medicare Other | Admitting: Dermatology

## 2020-11-24 ENCOUNTER — Emergency Department
Admission: EM | Admit: 2020-11-24 | Discharge: 2020-11-24 | Disposition: A | Discharge status: Home / Self Care | Payer: Medicare Other | Attending: Emergency Medicine | Admitting: Emergency Medicine

## 2020-11-24 ENCOUNTER — Other Ambulatory Visit: Payer: Self-pay

## 2020-11-24 DIAGNOSIS — Z5321 Procedure and treatment not carried out due to patient leaving prior to being seen by health care provider: Secondary | ICD-10-CM | POA: Diagnosis not present

## 2020-11-24 DIAGNOSIS — R109 Unspecified abdominal pain: Secondary | ICD-10-CM | POA: Diagnosis not present

## 2020-11-24 DIAGNOSIS — R1110 Vomiting, unspecified: Secondary | ICD-10-CM | POA: Diagnosis not present

## 2020-11-24 DIAGNOSIS — G8929 Other chronic pain: Secondary | ICD-10-CM | POA: Insufficient documentation

## 2020-11-24 DIAGNOSIS — R531 Weakness: Secondary | ICD-10-CM | POA: Diagnosis not present

## 2020-11-24 LAB — COMPREHENSIVE METABOLIC PANEL
ALT: 12 U/L (ref 0–44)
AST: 21 U/L (ref 15–41)
Albumin: 4.1 g/dL (ref 3.5–5.0)
Alkaline Phosphatase: 42 U/L (ref 38–126)
Anion gap: 8 (ref 5–15)
BUN: 9 mg/dL (ref 8–23)
CO2: 25 mmol/L (ref 22–32)
Calcium: 9.1 mg/dL (ref 8.9–10.3)
Chloride: 101 mmol/L (ref 98–111)
Creatinine, Ser: 0.79 mg/dL (ref 0.44–1.00)
GFR, Estimated: >60 mL/min (ref >60)
Glucose, Bld: 126 mg/dL — ABNORMAL HIGH (ref 70–99)
Potassium: 4.2 mmol/L (ref 3.5–5.1)
Sodium: 134 mmol/L — ABNORMAL LOW (ref 135–145)
Total Bilirubin: 0.9 mg/dL (ref 0.3–1.2)
Total Protein: 6.9 g/dL (ref 6.5–8.1)

## 2020-11-24 LAB — CBC
HCT: 38.7 % (ref 36.0–46.0)
Hemoglobin: 12.5 g/dL (ref 12.0–15.0)
MCH: 31.5 pg (ref 26.0–34.0)
MCHC: 32.3 g/dL (ref 30.0–36.0)
MCV: 97.5 fL (ref 80.0–100.0)
Platelets: 251 10*3/uL (ref 150–400)
RBC: 3.97 MIL/uL (ref 3.87–5.11)
RDW: 13.2 % (ref 11.5–15.5)
WBC: 5.7 10*3/uL (ref 4.0–10.5)
nRBC: 0 % (ref 0.0–0.2)

## 2020-11-24 LAB — LIPASE, BLOOD: Lipase: 34 U/L (ref 11–51)

## 2020-11-24 NOTE — ED Triage Notes (Signed)
Pt daughter up to the desk and reported they were leaving. RN encouraged pt to stay however pt does not want to stay. RN encouraged pt and daughter to call MD and let them know to review her labs

## 2020-11-24 NOTE — ED Triage Notes (Signed)
BIB via POV with daughter; c/o emesis X 2 days and generalized weakness. Reports chronic abdominal pain. Pt does not remember if she has had a recent BM or not. Denies CP or SOB.

## 2020-12-21 ENCOUNTER — Other Ambulatory Visit: Payer: Self-pay | Admitting: Internal Medicine

## 2021-01-04 ENCOUNTER — Ambulatory Visit (INDEPENDENT_AMBULATORY_CARE_PROVIDER_SITE_OTHER): Payer: Medicare Other | Admitting: Internal Medicine

## 2021-01-04 ENCOUNTER — Other Ambulatory Visit: Payer: Self-pay

## 2021-01-04 ENCOUNTER — Encounter: Payer: Self-pay | Admitting: Internal Medicine

## 2021-01-04 VITALS — BP 122/82 | HR 69 | Temp 96.9°F | Ht 62.0 in | Wt 121.4 lb

## 2021-01-04 DIAGNOSIS — N2889 Other specified disorders of kidney and ureter: Secondary | ICD-10-CM

## 2021-01-04 DIAGNOSIS — R350 Frequency of micturition: Secondary | ICD-10-CM

## 2021-01-04 DIAGNOSIS — K219 Gastro-esophageal reflux disease without esophagitis: Secondary | ICD-10-CM | POA: Diagnosis not present

## 2021-01-04 DIAGNOSIS — I10 Essential (primary) hypertension: Secondary | ICD-10-CM

## 2021-01-04 DIAGNOSIS — F320 Major depressive disorder, single episode, mild: Secondary | ICD-10-CM | POA: Diagnosis not present

## 2021-01-04 DIAGNOSIS — E871 Hypo-osmolality and hyponatremia: Secondary | ICD-10-CM | POA: Diagnosis not present

## 2021-01-04 DIAGNOSIS — F32A Depression, unspecified: Secondary | ICD-10-CM

## 2021-01-04 DIAGNOSIS — E039 Hypothyroidism, unspecified: Secondary | ICD-10-CM | POA: Diagnosis not present

## 2021-01-04 DIAGNOSIS — E7800 Pure hypercholesterolemia, unspecified: Secondary | ICD-10-CM

## 2021-01-04 DIAGNOSIS — D649 Anemia, unspecified: Secondary | ICD-10-CM

## 2021-01-04 DIAGNOSIS — M545 Low back pain, unspecified: Secondary | ICD-10-CM | POA: Diagnosis not present

## 2021-01-04 DIAGNOSIS — G8929 Other chronic pain: Secondary | ICD-10-CM

## 2021-01-04 DIAGNOSIS — R739 Hyperglycemia, unspecified: Secondary | ICD-10-CM

## 2021-01-04 DIAGNOSIS — R634 Abnormal weight loss: Secondary | ICD-10-CM | POA: Diagnosis not present

## 2021-01-04 DIAGNOSIS — C8220 Follicular lymphoma grade III, unspecified, unspecified site: Secondary | ICD-10-CM

## 2021-01-04 NOTE — Progress Notes (Unsigned)
Patient ID: Tasha Avila, female   DOB: [birth year], 87 y.o.   MRN: [id number]   Subjective:    Patient ID: Tasha Avila, female    DOB: Jul 18, 1928, 87 y.o.   MRN: [id number]  HPI This visit occurred during the SARS-CoV-2 public health emergency.  Safety protocols were in place, including screening questions prior to the visit, additional usage of staff PPE, and extensive cleaning of exam room while observing appropriate contact time as indicated for disinfecting solutions.  Patient here for a scheduled follow up.  She is accompanied by her daughter.  History obtained from both of them.  Here to follow up regarding her blood pressure and cholesterol.  She has lost weight.  Discussed.  Decreased po intake.  Daughter states ate well when she stayed at her house.  Some occasional nausea.  No vomiting.  Bowels moving.  Increased urinary frequency.  No chest pain reported.  Breathing stable.  Discussed further evaluation of weight loss.  She declines.  Desires no further testing or evaluation.   Past Medical History:  Diagnosis Date  . Anemia   . Arthritis   . GERD (gastroesophageal reflux disease)   . Hypercholesterolemia   . Hypertension   . Hypothyroidism    Past Surgical History:  Procedure Laterality Date  . ABDOMINAL HYSTERECTOMY  1960s   bladder tack at the same time, ovaries not removed  . LAPAROSCOPIC CHOLECYSTECTOMY  5/11   Family History  Problem Relation Age of Onset  . Lymphoma Brother   . Stroke Brother   . Diabetes Brother   . Diabetes Brother        x2  . Stroke Brother   . Thyroid disease Brother   . Stroke Mother    Social History   Socioeconomic History  . Marital status: Widowed    Spouse name: Not on file  . Number of children: 2  . Years of education: Not on file  . Highest education level: Not on file  Occupational History  . Not on file  Tobacco Use  . Smoking status: Never Smoker  . Smokeless tobacco: Never Used  Vaping Use  . Vaping  Use: Never used  Substance and Sexual Activity  . Alcohol use: No    Alcohol/week: 0.0 standard drinks  . Drug use: No  . Sexual activity: Not on file  Other Topics Concern  . Not on file  Social History Narrative  . Not on file   Social Determinants of Health   Financial Resource Strain: Low Risk   . Difficulty of Paying Living Expenses: Not hard at all  Food Insecurity: No Food Insecurity  . Worried About Charity fundraiser in the Last Year: Never true  . Ran Out of Food in the Last Year: Never true  Transportation Needs: No Transportation Needs  . Lack of Transportation (Medical): No  . Lack of Transportation (Non-Medical): No  Physical Activity: Not on file  Stress: No Stress Concern Present  . Feeling of Stress : Not at all  Social Connections: Not on file    Outpatient Encounter Medications as of 01/04/2021  Medication Sig  . acetaminophen (TYLENOL) 500 MG tablet Take 500 mg by mouth every 6 (six) hours as needed.  . Cyanocobalamin (B-12) 3000 MCG CAPS Take 3,000 mcg by mouth daily.  . DULoxetine (CYMBALTA) 60 MG capsule Take 1 capsule (60 mg total) by mouth daily.  . Homeopathic Products ([employer] LEG CRAMPS PAIN RELIEF PO) Take by mouth as needed.  Marland Kitchen  levothyroxine (SYNTHROID) 75 MCG tablet Take 1 tablet by mouth once daily  . lisinopril (ZESTRIL) 40 MG tablet Take 1 tablet (40 mg total) by mouth daily.  . pantoprazole (PROTONIX) 40 MG tablet Take 1 tablet by mouth once daily  . Propylene Glycol (SYSTANE BALANCE OP) Apply 1 drop to eye as needed.  . rosuvastatin (CRESTOR) 5 MG tablet TAKE 1 TABLET BY MOUTH ON MONDAY, WEDNESDAY AND FRIDAY  . SALINE NASAL SPRAY NA Place into the nose as needed.  . ferrous sulfate 325 (65 FE) MG EC tablet Take 325 mg by mouth daily with breakfast. (Patient not taking: No sig reported)   No facility-administered encounter medications on file as of 01/04/2021.    Review of Systems  Constitutional:       Some decreased appetite.  Weight loss.    HENT: Negative for congestion and sinus pressure.   Respiratory: Negative for cough, chest tightness and shortness of breath.   Cardiovascular: Negative for chest pain, palpitations and leg swelling.  Gastrointestinal: Negative for vomiting.       Some occasional nausea.  Some abdominal discomfort.   Genitourinary: Positive for frequency. Negative for difficulty urinating.  Musculoskeletal: Positive for back pain. Negative for joint swelling.  Skin: Negative for color change and rash.  Neurological: Negative for dizziness, light-headedness and headaches.  Psychiatric/Behavioral: Negative for agitation and dysphoric mood.       Objective:    Physical Exam Vitals reviewed.  Constitutional:      General: She is not in acute distress.    Appearance: Normal appearance.  HENT:     Head: Normocephalic and atraumatic.     Right Ear: External ear normal.     Left Ear: External ear normal.     Mouth/Throat:     Mouth: Oropharynx is clear and moist.  Eyes:     General: No scleral icterus.       Right eye: No discharge.        Left eye: No discharge.     Conjunctiva/sclera: Conjunctivae normal.  Neck:     Thyroid: No thyromegaly.  Cardiovascular:     Rate and Rhythm: Normal rate and regular rhythm.  Pulmonary:     Effort: No respiratory distress.     Breath sounds: Normal breath sounds. No wheezing.  Abdominal:     General: Bowel sounds are normal.     Palpations: Abdomen is soft.     Comments: Minimal tenderness to palpation.   Musculoskeletal:        General: No swelling, tenderness or edema.     Cervical back: Neck supple. No tenderness.  Lymphadenopathy:     Cervical: No cervical adenopathy.  Skin:    Findings: No erythema or rash.  Neurological:     Mental Status: She is alert.  Psychiatric:        Mood and Affect: Mood normal.        Behavior: Behavior normal.     BP 122/82   Pulse 69   Temp (!) 96.9 F (36.1 C)   Ht '5\' 2"'  (1.575 m)   Wt 121 lb 6 oz (55.1 kg)    SpO2 98%   BMI 22.20 kg/m  Wt Readings from Last 3 Encounters:  01/04/21 121 lb 6 oz (55.1 kg)  11/24/20 135 lb (61.2 kg)  09/02/20 131 lb 9.6 oz (59.7 kg)     Lab Results  Component Value Date   WBC 5.7 11/24/2020   HGB 12.5 11/24/2020   HCT 38.7  11/24/2020   PLT 251 11/24/2020   GLUCOSE 91 01/04/2021   CHOL 234 (H) 01/01/2020   TRIG 103.0 01/01/2020   HDL 71.70 01/01/2020   LDLDIRECT 123.3 10/24/2012   LDLCALC 141 (H) 01/01/2020   ALT 12 11/24/2020   AST 21 11/24/2020   NA 137 01/04/2021   K 4.4 01/04/2021   CL 102 01/04/2021   CREATININE 0.94 01/04/2021   BUN 13 01/04/2021   CO2 28 01/04/2021   TSH 2.88 01/04/2021   INR 1.1 05/24/2020   HGBA1C 5.7 01/01/2020       Assessment & Plan:   Problem List Items Addressed This Visit    Anemia    Follow cbc.       Back pain    On cymbalta.  Appears to be stable.       GERD (gastroesophageal reflux disease)    On protonix.  Occasional nausea.  Follow.       Hypercholesterolemia    Follow lipid panel.  On crestor.        Hyperglycemia    Follow met b and a1c.       Hypertension    Blood pressure doing well.  On lisinopril.  Follow pressures. Follow metabolic panel.       Hyponatremia    Recheck sodium to confirm wnl.       Relevant Orders   Basic metabolic panel (Completed)   Hypothyroidism    On thyroid replacement.  Follow tsh.       Mild depression ([place])    On cymbalta.        Non-Hodgkin's lymphoma [facility])    Has been evaluated by oncology.  Discussed further w/up.  She declines.  Desires no further scanning or evaluation.  Follow.       Pure hypercholesterolemia    Follow lipid panel.       Renal mass    Previously evaluated by urology and nephrology.  Discussed further w/up.  She declines.  Declines f/u scan, etc.        Urinary frequency - Primary    Check urine to confirm no infection.       Relevant Orders   Urinalysis, Routine w reflex microscopic (Completed)   Urine  Culture (Completed)   Weight loss    Decreased weight as outlined.  Discussed further w/up and evaluation.  She declines.  Declines further scanning, etc.  Follow.        Relevant Orders   TSH (Completed)       Einar Pheasant, MD

## 2021-01-05 ENCOUNTER — Encounter: Payer: Self-pay | Admitting: Internal Medicine

## 2021-01-05 LAB — URINALYSIS, ROUTINE W REFLEX MICROSCOPIC
Bilirubin Urine: NEGATIVE
Hgb urine dipstick: NEGATIVE
Ketones, ur: NEGATIVE
Leukocytes,Ua: NEGATIVE
Nitrite: NEGATIVE
Specific Gravity, Urine: 1.02 (ref 1.000–1.030)
Total Protein, Urine: NEGATIVE
Urine Glucose: NEGATIVE
Urobilinogen, UA: 0.2 (ref 0.0–1.0)
pH: 6 (ref 5.0–8.0)

## 2021-01-05 LAB — BASIC METABOLIC PANEL
BUN: 13 mg/dL (ref 6–23)
CO2: 28 mEq/L (ref 19–32)
Calcium: 9.3 mg/dL (ref 8.4–10.5)
Chloride: 102 mEq/L (ref 96–112)
Creatinine, Ser: 0.94 mg/dL (ref 0.40–1.20)
GFR: 53.51 mL/min — ABNORMAL LOW (ref >60.00)
Glucose, Bld: 91 mg/dL (ref 70–99)
Potassium: 4.4 mEq/L (ref 3.5–5.1)
Sodium: 137 mEq/L (ref 135–145)

## 2021-01-05 LAB — URINE CULTURE
MICRO NUMBER:: 11481421
SPECIMEN QUALITY:: ADEQUATE

## 2021-01-05 LAB — TSH: TSH: 2.88 u[IU]/mL (ref 0.35–4.50)

## 2021-01-05 NOTE — Assessment & Plan Note (Signed)
Has been evaluated by oncology.  Discussed further w/up.  She declines.  Desires no further scanning or evaluation.  Follow.

## 2021-01-05 NOTE — Assessment & Plan Note (Signed)
On protonix.  Occasional nausea.  Follow.

## 2021-01-05 NOTE — Assessment & Plan Note (Signed)
Follow lipid panel.  On crestor.

## 2021-01-05 NOTE — Assessment & Plan Note (Signed)
Previously evaluated by urology and nephrology.  Discussed further w/up.  She declines.  Declines f/u scan, etc.

## 2021-01-05 NOTE — Assessment & Plan Note (Signed)
Recheck sodium to confirm wnl.  

## 2021-01-05 NOTE — Assessment & Plan Note (Signed)
On cymbalta.  Appears to be stable.

## 2021-01-05 NOTE — Assessment & Plan Note (Signed)
Follow lipid panel.   

## 2021-01-05 NOTE — Assessment & Plan Note (Signed)
On cymbalta

## 2021-01-05 NOTE — Assessment & Plan Note (Signed)
Follow met b and a1c.  

## 2021-01-05 NOTE — Assessment & Plan Note (Signed)
On thyroid replacement.  Follow tsh.  

## 2021-01-05 NOTE — Assessment & Plan Note (Signed)
Follow cbc.  

## 2021-01-05 NOTE — Assessment & Plan Note (Signed)
Check urine to confirm no infection.  

## 2021-01-05 NOTE — Assessment & Plan Note (Signed)
Decreased weight as outlined.  Discussed further w/up and evaluation.  She declines.  Declines further scanning, etc.  Follow.

## 2021-01-05 NOTE — Assessment & Plan Note (Signed)
Blood pressure doing well.  On lisinopril.  Follow pressures.  Follow metabolic panel.  

## 2021-01-12 ENCOUNTER — Other Ambulatory Visit: Payer: Self-pay | Admitting: Internal Medicine

## 2021-02-05 ENCOUNTER — Other Ambulatory Visit: Payer: Self-pay | Admitting: Internal Medicine

## 2021-02-23 ENCOUNTER — Other Ambulatory Visit: Payer: Self-pay | Admitting: Internal Medicine

## 2021-03-22 ENCOUNTER — Other Ambulatory Visit: Payer: Self-pay | Admitting: Internal Medicine

## 2021-04-05 ENCOUNTER — Ambulatory Visit (INDEPENDENT_AMBULATORY_CARE_PROVIDER_SITE_OTHER): Payer: Medicare Other | Admitting: Internal Medicine

## 2021-04-05 ENCOUNTER — Other Ambulatory Visit: Payer: Self-pay

## 2021-04-05 VITALS — BP 120/62 | HR 86 | Temp 97.0°F | Resp 16 | Ht 62.0 in | Wt 132.6 lb

## 2021-04-05 DIAGNOSIS — E7800 Pure hypercholesterolemia, unspecified: Secondary | ICD-10-CM | POA: Diagnosis not present

## 2021-04-05 DIAGNOSIS — K219 Gastro-esophageal reflux disease without esophagitis: Secondary | ICD-10-CM

## 2021-04-05 DIAGNOSIS — F32A Depression, unspecified: Secondary | ICD-10-CM

## 2021-04-05 DIAGNOSIS — D649 Anemia, unspecified: Secondary | ICD-10-CM | POA: Diagnosis not present

## 2021-04-05 DIAGNOSIS — E039 Hypothyroidism, unspecified: Secondary | ICD-10-CM

## 2021-04-05 DIAGNOSIS — I10 Essential (primary) hypertension: Secondary | ICD-10-CM

## 2021-04-05 DIAGNOSIS — N2889 Other specified disorders of kidney and ureter: Secondary | ICD-10-CM

## 2021-04-05 DIAGNOSIS — R739 Hyperglycemia, unspecified: Secondary | ICD-10-CM

## 2021-04-05 DIAGNOSIS — C8220 Follicular lymphoma grade III, unspecified, unspecified site: Secondary | ICD-10-CM | POA: Diagnosis not present

## 2021-04-05 DIAGNOSIS — F320 Major depressive disorder, single episode, mild: Secondary | ICD-10-CM | POA: Diagnosis not present

## 2021-04-05 DIAGNOSIS — R634 Abnormal weight loss: Secondary | ICD-10-CM

## 2021-04-05 MED ORDER — DULOXETINE HCL 60 MG PO CPEP: 60.0000 mg | ORAL_CAPSULE | Freq: Every day | ORAL | 1 refills | Status: DC

## 2021-04-05 MED ORDER — LISINOPRIL 40 MG PO TABS: 40.0000 mg | ORAL_TABLET | Freq: Every day | ORAL | 1 refills | Status: DC

## 2021-04-05 MED ORDER — LEVOTHYROXINE SODIUM 75 MCG PO TABS: 75.0000 ug | ORAL_TABLET | Freq: Every day | ORAL | 1 refills | Status: DC

## 2021-04-05 MED ORDER — ROSUVASTATIN CALCIUM 5 MG PO TABS: ORAL_TABLET | ORAL | 0 refills | Status: DC

## 2021-04-05 MED ORDER — PANTOPRAZOLE SODIUM 40 MG PO TBEC: 1.0000 | DELAYED_RELEASE_TABLET | Freq: Every day | ORAL | 1 refills | Status: DC

## 2021-04-05 NOTE — Progress Notes (Signed)
Patient ID: Tasha Avila, female   DOB: 28-Sep-1928, 87 y.o.   MRN: [id number]   Subjective:    Patient ID: Tasha Avila, female    DOB: [birth year], 87 y.o.   MRN: [id number]  HPI This visit occurred during the SARS-CoV-2 public health emergency.  Safety protocols were in place, including screening questions prior to the visit, additional usage of staff PPE, and extensive cleaning of exam room while observing appropriate contact time as indicated for disinfecting solutions.  Patient here for a scheduled follow up.  Here to follow up regarding her blood pressure, previous weight loss and knee pain.  She is accompanied by her daughter.  History obtained from both of them.  Reports she is eating.  Weight up.  No chest pain or sob reported.  Persistent msk pain. Main complaint today is knee pain.  Discussed.  Discussed further evaluation and treatment - PT, referral to ortho, possible injection, etc.  She declines.  Takes tylenol.  Handling stress.  Overall feels things are stable.  Declines further testing as above.    Past Medical History:  Diagnosis Date  . Anemia   . Arthritis   . GERD (gastroesophageal reflux disease)   . Hypercholesterolemia   . Hypertension   . Hypothyroidism    Past Surgical History:  Procedure Laterality Date  . ABDOMINAL HYSTERECTOMY  1960s   bladder tack at the same time, ovaries not removed  . LAPAROSCOPIC CHOLECYSTECTOMY  5/11   Family History  Problem Relation Age of Onset  . Lymphoma Brother   . Stroke Brother   . Diabetes Brother   . Diabetes Brother        x2  . Stroke Brother   . Thyroid disease Brother   . Stroke Mother    Social History   Socioeconomic History  . Marital status: Widowed    Spouse name: Not on file  . Number of children: 2  . Years of education: Not on file  . Highest education level: Not on file  Occupational History  . Not on file  Tobacco Use  . Smoking status: Never Smoker  . Smokeless tobacco: Never  Used  Vaping Use  . Vaping Use: Never used  Substance and Sexual Activity  . Alcohol use: No    Alcohol/week: 0.0 standard drinks  . Drug use: No  . Sexual activity: Not on file  Other Topics Concern  . Not on file  Social History Narrative  . Not on file   Social Determinants of Health   Financial Resource Strain: Low Risk   . Difficulty of Paying Living Expenses: Not hard at all  Food Insecurity: No Food Insecurity  . Worried About Charity fundraiser in the Last Year: Never true  . Ran Out of Food in the Last Year: Never true  Transportation Needs: No Transportation Needs  . Lack of Transportation (Medical): No  . Lack of Transportation (Non-Medical): No  Physical Activity: Not on file  Stress: No Stress Concern Present  . Feeling of Stress : Not at all  Social Connections: Not on file    Outpatient Encounter Medications as of 04/05/2021  Medication Sig  . acetaminophen (TYLENOL) 500 MG tablet Take 500 mg by mouth every 6 (six) hours as needed.  . Cyanocobalamin (B-12) 3000 MCG CAPS Take 3,000 mcg by mouth daily.  . Homeopathic Products ([employer] LEG CRAMPS PAIN RELIEF PO) Take by mouth as needed.  Marland Kitchen Propylene Glycol (SYSTANE BALANCE OP) Apply 1 drop to  eye as needed.  Marland Kitchen SALINE NASAL SPRAY NA Place into the nose as needed.  . [DISCONTINUED] DULoxetine (CYMBALTA) 60 MG capsule Take 1 capsule by mouth once daily  . [DISCONTINUED] levothyroxine (SYNTHROID) 75 MCG tablet Take 1 tablet by mouth once daily  . [DISCONTINUED] lisinopril (ZESTRIL) 40 MG tablet Take 1 tablet by mouth once daily  . [DISCONTINUED] pantoprazole (PROTONIX) 40 MG tablet Take 1 tablet by mouth once daily  . [DISCONTINUED] rosuvastatin (CRESTOR) 5 MG tablet TAKE 1 TABLET BY MOUTH ON MONDAY, WEDNESDAY AND FRIDAY  . DULoxetine (CYMBALTA) 60 MG capsule Take 1 capsule (60 mg total) by mouth daily.  Marland Kitchen levothyroxine (SYNTHROID) 75 MCG tablet Take 1 tablet (75 mcg total) by mouth daily.  Marland Kitchen lisinopril (ZESTRIL) 40 MG  tablet Take 1 tablet (40 mg total) by mouth daily.  . pantoprazole (PROTONIX) 40 MG tablet Take 1 tablet (40 mg total) by mouth daily.  . rosuvastatin (CRESTOR) 5 MG tablet TAKE 1 TABLET BY MOUTH ON MONDAY, WEDNESDAY AND FRIDAY  . [DISCONTINUED] ferrous sulfate 325 (65 FE) MG EC tablet Take 325 mg by mouth daily with breakfast. (Patient not taking: No sig reported)   No facility-administered encounter medications on file as of 04/05/2021.    Review of Systems  Constitutional: Negative for appetite change and unexpected weight change.       Weight back up.   HENT: Negative for congestion and sinus pressure.   Respiratory: Negative for cough, chest tightness and shortness of breath.   Cardiovascular: Negative for chest pain, palpitations and leg swelling.  Gastrointestinal: Negative for abdominal pain, diarrhea, nausea and vomiting.  Genitourinary: Negative for difficulty urinating and dysuria.  Musculoskeletal: Negative for myalgias.       Knee pain as outlined.   Skin: Negative for color change and rash.  Neurological: Negative for dizziness, light-headedness and headaches.  Psychiatric/Behavioral: Negative for agitation and dysphoric mood.       Objective:    Physical Exam Vitals reviewed.  Constitutional:      General: She is not in acute distress.    Appearance: Normal appearance.  HENT:     Head: Normocephalic and atraumatic.     Right Ear: External ear normal.     Left Ear: External ear normal.  Eyes:     General: No scleral icterus.       Right eye: No discharge.        Left eye: No discharge.     Conjunctiva/sclera: Conjunctivae normal.  Neck:     Thyroid: No thyromegaly.  Cardiovascular:     Rate and Rhythm: Normal rate and regular rhythm.  Pulmonary:     Effort: No respiratory distress.     Breath sounds: Normal breath sounds. No wheezing.  Abdominal:     General: Bowel sounds are normal.     Palpations: Abdomen is soft.     Tenderness: There is no abdominal  tenderness.  Musculoskeletal:        General: No swelling or tenderness.     Cervical back: Neck supple.  Lymphadenopathy:     Cervical: No cervical adenopathy.  Skin:    Findings: No erythema or rash.  Neurological:     Mental Status: She is alert.  Psychiatric:        Mood and Affect: Mood normal.        Behavior: Behavior normal.     BP 120/62   Pulse 86   Temp (!) 97 F (36.1 C) (Temporal)   Resp 16  Ht '5\' 2"'  (1.575 m)   Wt 132 lb 9.6 oz (60.1 kg)   SpO2 98%   BMI 24.25 kg/m  Wt Readings from Last 3 Encounters:  04/05/21 132 lb 9.6 oz (60.1 kg)  01/04/21 121 lb 6 oz (55.1 kg)  11/24/20 135 lb (61.2 kg)     Lab Results  Component Value Date   WBC 6.7 04/05/2021   HGB 11.7 (L) 04/05/2021   HCT 35.5 (L) 04/05/2021   PLT 261.0 04/05/2021   GLUCOSE 87 04/05/2021   CHOL 234 (H) 01/01/2020   TRIG 103.0 01/01/2020   HDL 71.70 01/01/2020   LDLDIRECT 123.3 10/24/2012   LDLCALC 141 (H) 01/01/2020   ALT 10 04/05/2021   AST 14 04/05/2021   NA 140 04/05/2021   K 4.3 04/05/2021   CL 104 04/05/2021   CREATININE 0.90 04/05/2021   BUN 16 04/05/2021   CO2 30 04/05/2021   TSH 2.88 01/04/2021   INR 1.1 05/24/2020   HGBA1C 5.7 01/01/2020       Assessment & Plan:   Problem List Items Addressed This Visit    Anemia    Follow cbc.       GERD (gastroesophageal reflux disease)    Continue protonix.  Desires no further w/up or evaluation. Eating well.  Weight back up.       Relevant Medications   pantoprazole (PROTONIX) 40 MG tablet   Hyperglycemia    Follow met b and a1c.       Hypertension - Primary    Continue lisinopril.  Blood pressure is doing well.  Follow pressures.  Follow metabolic panel. No changes today.       Relevant Medications   rosuvastatin (CRESTOR) 5 MG tablet   lisinopril (ZESTRIL) 40 MG tablet   Other Relevant Orders   CBC with Differential/Platelet (Completed)   Hepatic function panel (Completed)   Basic metabolic panel (Completed)    Hypothyroidism    On thyroid replacement.  Follow tsh.        Relevant Medications   levothyroxine (SYNTHROID) 75 MCG tablet   Mild depression ([facility])    Continue cymbalta.  Stable.       Relevant Medications   DULoxetine (CYMBALTA) 60 MG capsule   Non-Hodgkin's lymphoma ([place])    Has been evaluated by oncology.  Discussed f/u and further scanning. She declines.        Pure hypercholesterolemia    Follow lipid panel.       Relevant Medications   rosuvastatin (CRESTOR) 5 MG tablet   lisinopril (ZESTRIL) 40 MG tablet   Renal mass    Previously evaluated by urology and nephrology.  Discussed again today. Declines f/u scan or further evaluation.       Weight loss    Previous weigh loss last visit.  Eating well.  Weight back up.  Follow.  Desires no further testing.           Einar Pheasant, MD

## 2021-04-05 NOTE — Patient Instructions (Signed)
Examples of probiotics:  Florastor, culturelle or align 

## 2021-04-06 LAB — HEPATIC FUNCTION PANEL
ALT: 10 U/L (ref 0–35)
AST: 14 U/L (ref 0–37)
Albumin: 4 g/dL (ref 3.5–5.2)
Alkaline Phosphatase: 44 U/L (ref 39–117)
Bilirubin, Direct: 0 mg/dL (ref 0.0–0.3)
Total Bilirubin: 0.3 mg/dL (ref 0.2–1.2)
Total Protein: 6.4 g/dL (ref 6.0–8.3)

## 2021-04-06 LAB — CBC WITH DIFFERENTIAL/PLATELET
Basophils Absolute: 0.1 10*3/uL (ref 0.0–0.1)
Basophils Relative: 1.2 % (ref 0.0–3.0)
Eosinophils Absolute: 0.1 10*3/uL (ref 0.0–0.7)
Eosinophils Relative: 1.8 % (ref 0.0–5.0)
HCT: 35.5 % — ABNORMAL LOW (ref 36.0–46.0)
Hemoglobin: 11.7 g/dL — ABNORMAL LOW (ref 12.0–15.0)
Lymphocytes Relative: 20.8 % (ref 12.0–46.0)
Lymphs Abs: 1.4 10*3/uL (ref 0.7–4.0)
MCHC: 32.9 g/dL (ref 30.0–36.0)
MCV: 98 fl (ref 78.0–100.0)
Monocytes Absolute: 0.8 10*3/uL (ref 0.1–1.0)
Monocytes Relative: 11.6 % (ref 3.0–12.0)
Neutro Abs: 4.3 10*3/uL (ref 1.4–7.7)
Neutrophils Relative %: 64.6 % (ref 43.0–77.0)
Platelets: 261 10*3/uL (ref 150.0–400.0)
RBC: 3.62 Mil/uL — ABNORMAL LOW (ref 3.87–5.11)
RDW: 12.7 % (ref 11.5–15.5)
WBC: 6.7 10*3/uL (ref 4.0–10.5)

## 2021-04-06 LAB — BASIC METABOLIC PANEL
BUN: 16 mg/dL (ref 6–23)
CO2: 30 mEq/L (ref 19–32)
Calcium: 9 mg/dL (ref 8.4–10.5)
Chloride: 104 mEq/L (ref 96–112)
Creatinine, Ser: 0.9 mg/dL (ref 0.40–1.20)
GFR: 56.27 mL/min — ABNORMAL LOW (ref >60.00)
Glucose, Bld: 87 mg/dL (ref 70–99)
Potassium: 4.3 mEq/L (ref 3.5–5.1)
Sodium: 140 mEq/L (ref 135–145)

## 2021-04-07 ENCOUNTER — Other Ambulatory Visit: Payer: Self-pay | Admitting: Internal Medicine

## 2021-04-07 DIAGNOSIS — D649 Anemia, unspecified: Secondary | ICD-10-CM

## 2021-04-10 ENCOUNTER — Encounter: Payer: Self-pay | Admitting: Internal Medicine

## 2021-04-10 NOTE — Assessment & Plan Note (Signed)
On thyroid replacement.  Follow tsh.  

## 2021-04-10 NOTE — Assessment & Plan Note (Signed)
Continue protonix.  Desires no further w/up or evaluation. Eating well.  Weight back up.

## 2021-04-10 NOTE — Assessment & Plan Note (Signed)
Previously evaluated by urology and nephrology.  Discussed again today. Declines f/u scan or further evaluation.

## 2021-04-10 NOTE — Assessment & Plan Note (Signed)
Follow lipid panel.   

## 2021-04-10 NOTE — Assessment & Plan Note (Signed)
Continue lisinopril.  Blood pressure is doing well.  Follow pressures.  Follow metabolic panel. No changes today.

## 2021-04-10 NOTE — Assessment & Plan Note (Signed)
Has been evaluated by oncology.  Discussed f/u and further scanning. She declines.

## 2021-04-10 NOTE — Assessment & Plan Note (Signed)
Continue cymbalta.  Stable.   

## 2021-04-10 NOTE — Assessment & Plan Note (Signed)
Follow met b and a1c.  

## 2021-04-10 NOTE — Assessment & Plan Note (Signed)
Follow cbc.  

## 2021-04-10 NOTE — Assessment & Plan Note (Signed)
Previous weigh loss last visit.  Eating well.  Weight back up.  Follow.  Desires no further testing.

## 2021-05-04 ENCOUNTER — Other Ambulatory Visit: Payer: Self-pay

## 2021-05-04 ENCOUNTER — Other Ambulatory Visit (INDEPENDENT_AMBULATORY_CARE_PROVIDER_SITE_OTHER): Payer: Medicare Other

## 2021-05-04 DIAGNOSIS — D649 Anemia, unspecified: Secondary | ICD-10-CM

## 2021-05-05 LAB — CBC WITH DIFFERENTIAL/PLATELET
Basophils Absolute: 0.1 10*3/uL (ref 0.0–0.1)
Basophils Relative: 0.9 % (ref 0.0–3.0)
Eosinophils Absolute: 0.1 10*3/uL (ref 0.0–0.7)
Eosinophils Relative: 1.7 % (ref 0.0–5.0)
HCT: 35.9 % — ABNORMAL LOW (ref 36.0–46.0)
Hemoglobin: 12.1 g/dL (ref 12.0–15.0)
Lymphocytes Relative: 23.5 % (ref 12.0–46.0)
Lymphs Abs: 1.7 10*3/uL (ref 0.7–4.0)
MCHC: 33.6 g/dL (ref 30.0–36.0)
MCV: 95.9 fl (ref 78.0–100.0)
Monocytes Absolute: 0.7 10*3/uL (ref 0.1–1.0)
Monocytes Relative: 10.5 % (ref 3.0–12.0)
Neutro Abs: 4.5 10*3/uL (ref 1.4–7.7)
Neutrophils Relative %: 63.4 % (ref 43.0–77.0)
Platelets: 258 10*3/uL (ref 150.0–400.0)
RBC: 3.75 Mil/uL — ABNORMAL LOW (ref 3.87–5.11)
RDW: 13.4 % (ref 11.5–15.5)
WBC: 7.1 10*3/uL (ref 4.0–10.5)

## 2021-05-05 LAB — VITAMIN B12: Vitamin B-12: 1260 pg/mL — ABNORMAL HIGH (ref 211–911)

## 2021-05-06 LAB — IBC + FERRITIN
Ferritin: 45.4 ng/mL (ref 10.0–291.0)
Iron: 73 ug/dL (ref 42–145)
Saturation Ratios: 23.2 % (ref 20.0–50.0)
Transferrin: 225 mg/dL (ref 212.0–360.0)

## 2021-08-09 ENCOUNTER — Other Ambulatory Visit: Payer: Self-pay

## 2021-08-09 ENCOUNTER — Ambulatory Visit (INDEPENDENT_AMBULATORY_CARE_PROVIDER_SITE_OTHER): Payer: Medicare Other | Admitting: Internal Medicine

## 2021-08-09 VITALS — BP 132/72 | HR 74 | Temp 97.9°F | Resp 16 | Ht 62.0 in | Wt 141.2 lb

## 2021-08-09 DIAGNOSIS — K219 Gastro-esophageal reflux disease without esophagitis: Secondary | ICD-10-CM | POA: Diagnosis not present

## 2021-08-09 DIAGNOSIS — E7800 Pure hypercholesterolemia, unspecified: Secondary | ICD-10-CM | POA: Diagnosis not present

## 2021-08-09 DIAGNOSIS — M545 Low back pain, unspecified: Secondary | ICD-10-CM | POA: Diagnosis not present

## 2021-08-09 DIAGNOSIS — E039 Hypothyroidism, unspecified: Secondary | ICD-10-CM

## 2021-08-09 DIAGNOSIS — N2889 Other specified disorders of kidney and ureter: Secondary | ICD-10-CM | POA: Diagnosis not present

## 2021-08-09 DIAGNOSIS — G8929 Other chronic pain: Secondary | ICD-10-CM

## 2021-08-09 DIAGNOSIS — D649 Anemia, unspecified: Secondary | ICD-10-CM

## 2021-08-09 DIAGNOSIS — F320 Major depressive disorder, single episode, mild: Secondary | ICD-10-CM

## 2021-08-09 DIAGNOSIS — F32A Depression, unspecified: Secondary | ICD-10-CM

## 2021-08-09 DIAGNOSIS — C8220 Follicular lymphoma grade III, unspecified, unspecified site: Secondary | ICD-10-CM

## 2021-08-09 DIAGNOSIS — R739 Hyperglycemia, unspecified: Secondary | ICD-10-CM | POA: Diagnosis not present

## 2021-08-09 DIAGNOSIS — I10 Essential (primary) hypertension: Secondary | ICD-10-CM | POA: Diagnosis not present

## 2021-08-09 NOTE — Progress Notes (Signed)
Patient ID: Tasha Avila, female   DOB: [birth year], 87 y.o.   MRN: [id number]   Subjective:    Patient ID: Tasha Avila, female    DOB: 03-27-1928, 87 y.o.   MRN: [id number]  This visit occurred during the SARS-CoV-2 public health emergency.  Safety protocols were in place, including screening questions prior to the visit, additional usage of staff PPE, and extensive cleaning of exam room while observing appropriate contact time as indicated for disinfecting solutions.   Patient here for a scheduled follow up   Chief Complaint  Patient presents with   Gastroesophageal Reflux   Hypertension   Hyperlipidemia   .   HPI Here to follow up regarding her hypertension, cholesterol and chronic pain.  She is accompanied by her daughter.  History obtained from both of them.  Overall appears things are stable.  Chronic pain - back and legs.  On cymbalta.  Desires no further intervention.  Reports some chest discomfort at times.  Does not appear to have pain with exertion.  Has seen cardiology previously and has f/u with them soon.  Discussed further w/up including EKG today. She declines and states will discuss with them at her appt.  Breathing stable.  No increased cough or congestion.  No acid reflux reported.  Eating.  Weight is up.  No vomiting.  No increased abdominal pain.  Bowels moving.    Past Medical History:  Diagnosis Date   Anemia    Arthritis    GERD (gastroesophageal reflux disease)    Hypercholesterolemia    Hypertension    Hypothyroidism    Past Surgical History:  Procedure Laterality Date   ABDOMINAL HYSTERECTOMY  1960s   bladder tack at the same time, ovaries not removed   LAPAROSCOPIC CHOLECYSTECTOMY  5/11   Family History  Problem Relation Age of Onset   Lymphoma Brother    Stroke Brother    Diabetes Brother    Diabetes Brother        x2   Stroke Brother    Thyroid disease Brother    Stroke Mother    Social History   Socioeconomic History    Marital status: Widowed    Spouse name: Not on file   Number of children: 2   Years of education: Not on file   Highest education level: Not on file  Occupational History   Not on file  Tobacco Use   Smoking status: Never   Smokeless tobacco: Never  Vaping Use   Vaping Use: Never used  Substance and Sexual Activity   Alcohol use: No    Alcohol/week: 0.0 standard drinks   Drug use: No   Sexual activity: Not on file  Other Topics Concern   Not on file  Social History Narrative   Not on file   Social Determinants of Health   Financial Resource Strain: Low Risk    Difficulty of Paying Living Expenses: Not hard at all  Food Insecurity: No Food Insecurity   Worried About Charity fundraiser in the Last Year: Never true   [place] in the Last Year: Never true  Transportation Needs: No Transportation Needs   Lack of Transportation (Medical): No   Lack of Transportation (Non-Medical): No  Physical Activity: Not on file  Stress: No Stress Concern Present   Feeling of Stress : Not at all  Social Connections: Not on file    Review of Systems  Constitutional:  Negative for appetite change and unexpected weight  change.  HENT:  Negative for congestion and sinus pressure.   Respiratory:  Negative for cough and chest tightness.        Breathing stable.   Cardiovascular:  Negative for palpitations and leg swelling.       Chest discomfort as outlined.    Gastrointestinal:  Negative for diarrhea, nausea and vomiting.       No increased abdominal pain.   Genitourinary:  Negative for difficulty urinating and dysuria.  Musculoskeletal:  Negative for joint swelling and myalgias.       Chronic back/leg pain.   Skin:  Negative for color change and rash.  Neurological:  Negative for dizziness, light-headedness and headaches.  Psychiatric/Behavioral:  Negative for agitation and dysphoric mood.       Objective:     BP 132/72   Pulse 74   Temp 97.9 F (36.6 C)   Resp 16   Ht  '5\' 2"'  (1.575 m)   Wt 141 lb 3.2 oz (64 kg)   SpO2 98%   BMI 25.83 kg/m  Wt Readings from Last 3 Encounters:  08/09/21 141 lb 3.2 oz (64 kg)  04/05/21 132 lb 9.6 oz (60.1 kg)  01/04/21 121 lb 6 oz (55.1 kg)    Physical Exam Vitals reviewed.  Constitutional:      General: She is not in acute distress.    Appearance: Normal appearance.  HENT:     Head: Normocephalic and atraumatic.     Right Ear: External ear normal.     Left Ear: External ear normal.  Eyes:     General: No scleral icterus.       Right eye: No discharge.        Left eye: No discharge.     Conjunctiva/sclera: Conjunctivae normal.  Neck:     Thyroid: No thyromegaly.  Cardiovascular:     Rate and Rhythm: Normal rate and regular rhythm.  Pulmonary:     Effort: No respiratory distress.     Breath sounds: Normal breath sounds. No wheezing.  Abdominal:     General: Bowel sounds are normal.     Palpations: Abdomen is soft.     Tenderness: There is no abdominal tenderness.  Musculoskeletal:        General: No swelling or tenderness.     Cervical back: Neck supple. No tenderness.  Lymphadenopathy:     Cervical: No cervical adenopathy.  Skin:    Findings: No erythema or rash.  Neurological:     Mental Status: She is alert.  Psychiatric:        Mood and Affect: Mood normal.        Behavior: Behavior normal.     Outpatient Encounter Medications as of 08/09/2021  Medication Sig   acetaminophen (TYLENOL) 500 MG tablet Take 500 mg by mouth every 6 (six) hours as needed.   Cyanocobalamin (B-12) 3000 MCG CAPS Take 3,000 mcg by mouth daily.   DULoxetine (CYMBALTA) 60 MG capsule Take 1 capsule (60 mg total) by mouth daily.   Homeopathic Products ([employer] LEG CRAMPS PAIN RELIEF PO) Take by mouth as needed.   levothyroxine (SYNTHROID) 75 MCG tablet Take 1 tablet (75 mcg total) by mouth daily.   lisinopril (ZESTRIL) 40 MG tablet Take 1 tablet (40 mg total) by mouth daily.   pantoprazole (PROTONIX) 40 MG tablet Take 1  tablet (40 mg total) by mouth daily.   Propylene Glycol (SYSTANE BALANCE OP) Apply 1 drop to eye as needed.   rosuvastatin (CRESTOR) 5 MG  tablet TAKE 1 TABLET BY MOUTH ON MONDAY, WEDNESDAY AND FRIDAY   SALINE NASAL SPRAY NA Place into the nose as needed.   No facility-administered encounter medications on file as of 08/09/2021.     Lab Results  Component Value Date   WBC 7.2 08/09/2021   HGB 11.9 (L) 08/09/2021   HCT 37.4 08/09/2021   PLT 320.0 08/09/2021   GLUCOSE 107 (H) 08/09/2021   CHOL 234 (H) 01/01/2020   TRIG 103.0 01/01/2020   HDL 71.70 01/01/2020   LDLDIRECT 123.3 10/24/2012   LDLCALC 141 (H) 01/01/2020   ALT 9 08/09/2021   AST 13 08/09/2021   NA 136 08/09/2021   K 4.6 08/09/2021   CL 101 08/09/2021   CREATININE 0.97 08/09/2021   BUN 13 08/09/2021   CO2 27 08/09/2021   TSH 6.18 (H) 08/09/2021   INR 1.1 05/24/2020   HGBA1C 5.9 08/09/2021       Assessment & Plan:   Problem List Items Addressed This Visit     Anemia    Recheck cbc today to confirm stable.        Back pain    On cymbalta.  Stable.  Desires no further w/up or evaluation.       GERD (gastroesophageal reflux disease)    Continue protonix.  Desires no further w/up or evaluation. Eating well.  Weight back up.       Hypercholesterolemia - Primary    Follow lipid panel. Continue crestor.       Relevant Orders   Hepatic function panel (Completed)   Basic metabolic panel (Completed)   CBC with Differential/Platelet (Completed)   Hyperglycemia    Follow met b and a1c.       Relevant Orders   Hemoglobin A1c (Completed)   Hypertension    Continue lisinopril.  Blood pressure is doing well.  Follow pressures.  Follow metabolic panel. No changes today.       Hypothyroidism    On thyroid replacement.  Follow tsh.        Relevant Orders   TSH (Completed)   Mild depression ([place])    Continue cymbalta.  Stable.       Non-Hodgkin's lymphoma [facility])    Has been evaluated by oncology.   Discussed f/u and further scanning. She declines.        Pure hypercholesterolemia    Follow lipid panel.       Renal mass    Previously evaluated by urology and nephrology.  Discussed again today. Declines f/u scan or further evaluation.         Einar Pheasant, MD

## 2021-08-10 LAB — CBC WITH DIFFERENTIAL/PLATELET
Basophils Absolute: 0.1 10*3/uL (ref 0.0–0.1)
Basophils Relative: 1.5 % (ref 0.0–3.0)
Eosinophils Absolute: 0.1 10*3/uL (ref 0.0–0.7)
Eosinophils Relative: 1.7 % (ref 0.0–5.0)
HCT: 37.4 % (ref 36.0–46.0)
Hemoglobin: 11.9 g/dL — ABNORMAL LOW (ref 12.0–15.0)
Lymphocytes Relative: 19.4 % (ref 12.0–46.0)
Lymphs Abs: 1.4 10*3/uL (ref 0.7–4.0)
MCHC: 31.7 g/dL (ref 30.0–36.0)
MCV: 96.9 fl (ref 78.0–100.0)
Monocytes Absolute: 1 10*3/uL (ref 0.1–1.0)
Monocytes Relative: 13.8 % — ABNORMAL HIGH (ref 3.0–12.0)
Neutro Abs: 4.6 10*3/uL (ref 1.4–7.7)
Neutrophils Relative %: 63.6 % (ref 43.0–77.0)
Platelets: 320 10*3/uL (ref 150.0–400.0)
RBC: 3.86 Mil/uL — ABNORMAL LOW (ref 3.87–5.11)
RDW: 13.8 % (ref 11.5–15.5)
WBC: 7.2 10*3/uL (ref 4.0–10.5)

## 2021-08-10 LAB — HEPATIC FUNCTION PANEL
ALT: 9 U/L (ref 0–35)
AST: 13 U/L (ref 0–37)
Albumin: 4.1 g/dL (ref 3.5–5.2)
Alkaline Phosphatase: 41 U/L (ref 39–117)
Bilirubin, Direct: 0.1 mg/dL (ref 0.0–0.3)
Total Bilirubin: 0.3 mg/dL (ref 0.2–1.2)
Total Protein: 6.8 g/dL (ref 6.0–8.3)

## 2021-08-10 LAB — BASIC METABOLIC PANEL
BUN: 13 mg/dL (ref 6–23)
CO2: 27 mEq/L (ref 19–32)
Calcium: 9 mg/dL (ref 8.4–10.5)
Chloride: 101 mEq/L (ref 96–112)
Creatinine, Ser: 0.97 mg/dL (ref 0.40–1.20)
GFR: 51.31 mL/min — ABNORMAL LOW (ref >60.00)
Glucose, Bld: 107 mg/dL — ABNORMAL HIGH (ref 70–99)
Potassium: 4.6 mEq/L (ref 3.5–5.1)
Sodium: 136 mEq/L (ref 135–145)

## 2021-08-10 LAB — TSH: TSH: 6.18 u[IU]/mL — ABNORMAL HIGH (ref 0.35–5.50)

## 2021-08-10 LAB — HEMOGLOBIN A1C: Hgb A1c MFr Bld: 5.9 % (ref 4.6–6.5)

## 2021-08-11 DIAGNOSIS — R1310 Dysphagia, unspecified: Secondary | ICD-10-CM | POA: Diagnosis not present

## 2021-08-11 DIAGNOSIS — K219 Gastro-esophageal reflux disease without esophagitis: Secondary | ICD-10-CM | POA: Diagnosis not present

## 2021-08-11 DIAGNOSIS — R0789 Other chest pain: Secondary | ICD-10-CM | POA: Diagnosis not present

## 2021-08-11 DIAGNOSIS — R52 Pain, unspecified: Secondary | ICD-10-CM | POA: Diagnosis not present

## 2021-08-11 DIAGNOSIS — I10 Essential (primary) hypertension: Secondary | ICD-10-CM | POA: Diagnosis not present

## 2021-08-11 DIAGNOSIS — E079 Disorder of thyroid, unspecified: Secondary | ICD-10-CM | POA: Diagnosis not present

## 2021-08-11 DIAGNOSIS — E7800 Pure hypercholesterolemia, unspecified: Secondary | ICD-10-CM | POA: Diagnosis not present

## 2021-08-14 ENCOUNTER — Encounter: Payer: Self-pay | Admitting: Internal Medicine

## 2021-08-14 NOTE — Assessment & Plan Note (Signed)
Follow met b and a1c.  

## 2021-08-14 NOTE — Assessment & Plan Note (Signed)
Continue cymbalta.  Stable.   

## 2021-08-14 NOTE — Assessment & Plan Note (Signed)
Has been evaluated by oncology.  Discussed f/u and further scanning. She declines.

## 2021-08-14 NOTE — Assessment & Plan Note (Signed)
On thyroid replacement.  Follow tsh.  

## 2021-08-14 NOTE — Assessment & Plan Note (Signed)
On cymbalta.  Stable.  Desires no further w/up or evaluation.

## 2021-08-14 NOTE — Assessment & Plan Note (Signed)
Continue protonix.  Desires no further w/up or evaluation. Eating well.  Weight back up.

## 2021-08-14 NOTE — Assessment & Plan Note (Signed)
Previously evaluated by urology and nephrology.  Discussed again today. Declines f/u scan or further evaluation.

## 2021-08-14 NOTE — Assessment & Plan Note (Signed)
Follow lipid panel.   

## 2021-08-14 NOTE — Assessment & Plan Note (Addendum)
Follow lipid panel. Continue crestor.  

## 2021-08-14 NOTE — Assessment & Plan Note (Signed)
Continue lisinopril.  Blood pressure is doing well.  Follow pressures.  Follow metabolic panel. No changes today.

## 2021-08-14 NOTE — Assessment & Plan Note (Signed)
Recheck cbc today to confirm stable.  

## 2021-08-17 ENCOUNTER — Telehealth: Payer: Self-pay

## 2021-08-17 DIAGNOSIS — R7989 Other specified abnormal findings of blood chemistry: Secondary | ICD-10-CM

## 2021-08-17 MED ORDER — LEVOTHYROXINE SODIUM 88 MCG PO TABS: 88.0000 ug | ORAL_TABLET | Freq: Every day | ORAL | 3 refills | Status: DC

## 2021-08-17 NOTE — Telephone Encounter (Signed)
Order placed for TSH for future labs. Synthroid 88 mcg sent into pt pharmacy.

## 2021-08-18 ENCOUNTER — Ambulatory Visit (INDEPENDENT_AMBULATORY_CARE_PROVIDER_SITE_OTHER): Payer: Medicare Other

## 2021-08-18 VITALS — Ht 62.0 in | Wt 141.0 lb

## 2021-08-18 DIAGNOSIS — Z0000 Encounter for general adult medical examination without abnormal findings: Secondary | ICD-10-CM | POA: Diagnosis not present

## 2021-08-18 NOTE — Progress Notes (Addendum)
Subjective:   Tasha Avila is a 87 y.o. female who presents for Medicare Annual (Subsequent) preventive examination.  Review of Systems    No ROS.  Medicare Wellness Virtual Visit.  Visual/audio telehealth visit, UTA vital signs.   See social history for additional risk factors.   Cardiac Risk Factors include: advanced age (>56mn, >>82women)     Objective:    Today's Vitals   08/18/21 1038  Weight: 141 lb (64 kg)  Height: '5\' 2"'$  (1.575 m)   Body mass index is 25.79 kg/m.  Advanced Directives 08/18/2021 11/24/2020 08/17/2020 05/24/2020 05/24/2020 08/08/2018 12/22/2015  Does Patient Have a Medical Advance Directive? Yes No Yes Yes No No No  Type of AParamedicof ATheodosiaLiving will - HMiddletonLiving will HKanorado- - -  Does patient want to make changes to medical advance directive? No - Patient declined - No - Patient declined No - Patient declined - - -  Copy of [place] Chart? Yes - validated most recent copy scanned in chart (See row information) - Yes - validated most recent copy scanned in chart (See row information) No - copy requested - - -  Would patient like information on creating a medical advance directive? - - - No - Patient declined No - Patient declined Yes (MAU/Ambulatory/Procedural Areas - Information given) -    Current Medications (verified) Outpatient Encounter Medications as of 08/18/2021  Medication Sig   acetaminophen (TYLENOL) 500 MG tablet Take 500 mg by mouth every 6 (six) hours as needed.   Cyanocobalamin (B-12) 3000 MCG CAPS Take 3,000 mcg by mouth daily.   DULoxetine (CYMBALTA) 60 MG capsule Take 1 capsule (60 mg total) by mouth daily.   Homeopathic Products ([employer] LEG CRAMPS PAIN RELIEF PO) Take by mouth as needed.   levothyroxine (SYNTHROID) 88 MCG tablet Take 1 tablet (88 mcg total) by mouth daily.   lisinopril (ZESTRIL) 40 MG tablet Take 1 tablet (40 mg total)  by mouth daily.   pantoprazole (PROTONIX) 40 MG tablet Take 1 tablet (40 mg total) by mouth daily.   Propylene Glycol (SYSTANE BALANCE OP) Apply 1 drop to eye as needed.   rosuvastatin (CRESTOR) 5 MG tablet TAKE 1 TABLET BY MOUTH ON MONDAY, WEDNESDAY AND FRIDAY   SALINE NASAL SPRAY NA Place into the nose as needed.   No facility-administered encounter medications on file as of 08/18/2021.    Allergies (verified) No known drug allergy   History: Past Medical History:  Diagnosis Date   Anemia    Arthritis    GERD (gastroesophageal reflux disease)    Hypercholesterolemia    Hypertension    Hypothyroidism    Past Surgical History:  Procedure Laterality Date   ABDOMINAL HYSTERECTOMY  1960s   bladder tack at the same time, ovaries not removed   LAPAROSCOPIC CHOLECYSTECTOMY  5/11   Family History  Problem Relation Age of Onset   Stroke Mother    Lymphoma Brother    Diabetes Brother    Diabetes Brother        x2   Thyroid disease Brother    Stroke Brother    Social History   Socioeconomic History   Marital status: Widowed    Spouse name: Not on file   Number of children: 2   Years of education: Not on file   Highest education level: Not on file  Occupational History   Not on file  Tobacco Use  Smoking status: Never   Smokeless tobacco: Never  Vaping Use   Vaping Use: Never used  Substance and Sexual Activity   Alcohol use: No    Alcohol/week: 0.0 standard drinks   Drug use: No   Sexual activity: Not on file  Other Topics Concern   Not on file  Social History Narrative   Not on file   Social Determinants of Health   Financial Resource Strain: Low Risk    Difficulty of Paying Living Expenses: Not hard at all  Food Insecurity: No Food Insecurity   Worried About Charity fundraiser in the Last Year: Never true   [place] in the Last Year: Never true  Transportation Needs: No Transportation Needs   Lack of Transportation (Medical): No   Lack of  Transportation (Non-Medical): No  Physical Activity: Not on file  Stress: No Stress Concern Present   Feeling of Stress : Not at all  Social Connections: Unknown   Frequency of Communication with Friends and Family: Not on file   Frequency of Social Gatherings with Friends and Family: More than three times a week   Attends Religious Services: Not on file   Active Member of Clubs or Organizations: Not on file   Attends Archivist Meetings: Not on file   Marital Status: Widowed    Tobacco Counseling Counseling given: Not Answered   Clinical Intake:  Pre-visit preparation completed: Yes        Diabetes: No  How often do you need to have someone help you when you read instructions, pamphlets, or other written materials from your doctor or pharmacy?: 4 - Often  Interpreter Needed?: No      Activities of Daily Living In your present state of health, do you have any difficulty performing the following activities: 08/18/2021  Hearing? Y  Comment Hearing aids  Vision? N  Difficulty concentrating or making decisions? N  Comment Age appropriate  Walking or climbing stairs? Y  Dressing or bathing? N  Comment Daughter assist as needed with closures.  Doing errands, shopping? Y  Comment She does not Physiological scientist and eating ? N  Using the Toilet? N  In the past six months, have you accidently leaked urine? N  Do you have problems with loss of bowel control? N  Managing your Medications? Y  Comment Daughter assist  Managing your Finances? Y  Comment Daughter assist  Housekeeping or managing your Housekeeping? Y  Comment Daughter assist  Some recent data might be hidden    Patient Care Team: Einar Pheasant, MD as PCP - General (Internal Medicine) Einar Pheasant, MD (Internal Medicine)  Indicate any recent Medical Services you may have received from other than [facility] providers in the past year (date may be approximate).     Assessment:   This is a  routine wellness examination for Tasha Avila.  I connected with Tasha Avila today by telephone and verified that I am speaking with the correct person using two identifiers. Location patient: home Location provider: work Persons participating in the virtual visit: patient, daughter and Marine scientist.    I discussed the limitations, risks, security and privacy concerns of performing an evaluation and management service by telephone and the availability of in person appointments. The patient expressed understanding and verbally consented to this telephonic visit.    Interactive audio and video telecommunications were attempted between this provider and patient, however failed, due to patient having technical difficulties OR patient did not have access to  video capability.  We continued and completed visit with audio only.  Some vital signs may be absent or patient reported.   Hearing/Vision screen Hearing Screening - Comments:: Hearing aids Vision Screening - Comments:: Wears corrective lenses    Dietary issues and exercise activities discussed: Intensity: Mild Regular diet   Goals Addressed             This Visit's Progress    Maintain Healthy Lifestyle        Healthy diet Stay hydrated       Depression Screen PHQ 2/9 Scores 08/18/2021 08/09/2021 04/05/2021 08/17/2020 12/16/2019 09/26/2019 08/15/2019  PHQ - 2 Score 0 - 0 0 0 - 0  PHQ- 9 Score - - 0 - 0 - -  Exception Documentation - Other- indicate reason in comment box - - - Other- indicate reason in comment box -  Not completed - Pt HOH - - - discussed with pcp. -    Fall Risk Fall Risk  08/18/2021 08/17/2020 06/01/2020 08/15/2019 08/15/2019  Falls in the past year? 0 0 1 0 0  Number falls in past yr: - 0 1 - -  Injury with Fall? 0 - 1 - -  Risk for fall due to : - - History of fall(s) - -  Risk for fall due to: Comment - - - - -  Follow up Falls evaluation completed Falls evaluation completed Falls evaluation completed Falls evaluation completed  -    FALL RISK PREVENTION PERTAINING TO THE HOME: Adequate lighting in your home to reduce risk of falls? Yes   ASSISTIVE DEVICES UTILIZED TO PREVENT FALLS: Life alert? Yes  Use of a cane, walker or w/c? Yes    TIMED UP AND GO: Was the test performed? No .   Cognitive Function:  Patient is alert and oriented x3.    6CIT Screen 08/08/2018  What Year? 0 points  What month? 0 points  What time? 0 points  Count back from 20 0 points  Months in reverse 2 points  Repeat phrase 2 points  Total Score 4    Immunizations Immunization History  Administered Date(s) Administered   Tdap 03/28/2018    Health Maintenance There are no preventive care reminders to display for this patient. Health Maintenance  Topic Date Due   Zoster Vaccines- Shingrix (1 of 2) 11/08/2021 (Originally 09/23/1947)   DEXA SCAN  03/18/2025 (Originally 09/24/1995)   INFLUENZA VACCINE  03/03/2106 (Originally 07/04/2021)   TETANUS/TDAP  03/28/2028   HPV VACCINES  Aged Out   COVID-19 Vaccine  Discontinued   PNA vac Low Risk Adult  Discontinued    Lung Cancer Screening: (Low Dose CT Chest recommended if Age 4-80 years, 30 pack-year currently smoking OR have quit w/in 15years.) does not qualify.   Hepatitis C Screening: does not qualify  Vision Screening: Recommended annual ophthalmology exams for early detection of glaucoma and other disorders of the eye.  Dental Screening: Recommended annual dental exams for proper oral hygiene  Community Resource Referral / Chronic Care Management: CRR required this visit?  No   CCM required this visit?  No      Plan:   Keep all routine maintenance appointments.   I have personally reviewed and noted the following in the patient's chart:   Medical and social history Use of alcohol, tobacco or illicit drugs  Current medications and supplements including opioid prescriptions. Not taking opioid.  Functional ability and status Nutritional status Physical  activity Advanced directives List of other physicians Hospitalizations,  surgeries, and ER visits in previous 12 months Vitals Screenings to include cognitive, depression, and falls Referrals and appointments  In addition, I have reviewed and discussed with patient certain preventive protocols, quality metrics, and best practice recommendations. A written personalized care plan for preventive services as well as general preventive health recommendations were provided to patient via mychart.     Varney Biles, LPN   [id number]

## 2021-08-18 NOTE — Patient Instructions (Addendum)
  Ms. Tasha Avila , Thank you for taking time to come for your Medicare Wellness Visit. I appreciate your ongoing commitment to your health goals. Please review the following plan we discussed and let me know if I can assist you in the future.   These are the goals we discussed:  Goals      Maintain Healthy Lifestyle      Healthy diet Stay hydrated        This is a list of the screening recommended for you and due dates:  Health Maintenance  Topic Date Due   Zoster (Shingles) Vaccine (1 of 2) 11/08/2021*   DEXA scan (bone density measurement)  03/18/2025*   Flu Shot  03/03/2106*   Tetanus Vaccine  03/28/2028   HPV Vaccine  Aged Out   COVID-19 Vaccine  Discontinued   Pneumonia vaccines  Discontinued  *Topic was postponed. The date shown is not the original due date.

## 2021-09-19 ENCOUNTER — Other Ambulatory Visit (INDEPENDENT_AMBULATORY_CARE_PROVIDER_SITE_OTHER): Payer: Medicare Other

## 2021-09-19 ENCOUNTER — Other Ambulatory Visit: Payer: Self-pay

## 2021-09-19 DIAGNOSIS — R7989 Other specified abnormal findings of blood chemistry: Secondary | ICD-10-CM

## 2021-09-19 LAB — TSH: TSH: 0.83 u[IU]/mL (ref 0.35–5.50)

## 2021-09-27 ENCOUNTER — Emergency Department
Admission: EM | Admit: 2021-09-27 | Discharge: 2021-09-27 | Disposition: A | Discharge status: Home / Self Care | Payer: Medicare Other | Attending: Emergency Medicine | Admitting: Emergency Medicine

## 2021-09-27 ENCOUNTER — Other Ambulatory Visit: Payer: Self-pay

## 2021-09-27 ENCOUNTER — Emergency Department: Payer: Medicare Other

## 2021-09-27 DIAGNOSIS — Z79899 Other long term (current) drug therapy: Secondary | ICD-10-CM | POA: Diagnosis not present

## 2021-09-27 DIAGNOSIS — R1013 Epigastric pain: Secondary | ICD-10-CM | POA: Diagnosis not present

## 2021-09-27 DIAGNOSIS — N182 Chronic kidney disease, stage 2 (mild): Secondary | ICD-10-CM | POA: Diagnosis not present

## 2021-09-27 DIAGNOSIS — R112 Nausea with vomiting, unspecified: Secondary | ICD-10-CM | POA: Diagnosis not present

## 2021-09-27 DIAGNOSIS — I700 Atherosclerosis of aorta: Secondary | ICD-10-CM | POA: Diagnosis not present

## 2021-09-27 DIAGNOSIS — I129 Hypertensive chronic kidney disease with stage 1 through stage 4 chronic kidney disease, or unspecified chronic kidney disease: Secondary | ICD-10-CM | POA: Insufficient documentation

## 2021-09-27 DIAGNOSIS — E039 Hypothyroidism, unspecified: Secondary | ICD-10-CM | POA: Insufficient documentation

## 2021-09-27 DIAGNOSIS — N281 Cyst of kidney, acquired: Secondary | ICD-10-CM | POA: Diagnosis not present

## 2021-09-27 DIAGNOSIS — Z20822 Contact with and (suspected) exposure to covid-19: Secondary | ICD-10-CM | POA: Insufficient documentation

## 2021-09-27 DIAGNOSIS — R531 Weakness: Secondary | ICD-10-CM | POA: Diagnosis not present

## 2021-09-27 DIAGNOSIS — I10 Essential (primary) hypertension: Secondary | ICD-10-CM | POA: Diagnosis not present

## 2021-09-27 DIAGNOSIS — K5730 Diverticulosis of large intestine without perforation or abscess without bleeding: Secondary | ICD-10-CM | POA: Diagnosis not present

## 2021-09-27 DIAGNOSIS — M419 Scoliosis, unspecified: Secondary | ICD-10-CM | POA: Diagnosis not present

## 2021-09-27 LAB — RESP PANEL BY RT-PCR (FLU A&B, COVID) ARPGX2
Influenza A by PCR: NEGATIVE
Influenza B by PCR: NEGATIVE
SARS Coronavirus 2 by RT PCR: NEGATIVE

## 2021-09-27 LAB — COMPREHENSIVE METABOLIC PANEL
ALT: 12 U/L (ref 0–44)
AST: 23 U/L (ref 15–41)
Albumin: 4.3 g/dL (ref 3.5–5.0)
Alkaline Phosphatase: 40 U/L (ref 38–126)
Anion gap: 14 (ref 5–15)
BUN: 25 mg/dL — ABNORMAL HIGH (ref 8–23)
CO2: 24 mmol/L (ref 22–32)
Calcium: 9.4 mg/dL (ref 8.9–10.3)
Chloride: 97 mmol/L — ABNORMAL LOW (ref 98–111)
Creatinine, Ser: 1.17 mg/dL — ABNORMAL HIGH (ref 0.44–1.00)
GFR, Estimated: 44 mL/min — ABNORMAL LOW (ref >60)
Glucose, Bld: 98 mg/dL (ref 70–99)
Potassium: 4.8 mmol/L (ref 3.5–5.1)
Sodium: 135 mmol/L (ref 135–145)
Total Bilirubin: 1.6 mg/dL — ABNORMAL HIGH (ref 0.3–1.2)
Total Protein: 7.4 g/dL (ref 6.5–8.1)

## 2021-09-27 LAB — CBC
HCT: 40.9 % (ref 36.0–46.0)
Hemoglobin: 14.2 g/dL (ref 12.0–15.0)
MCH: 32.7 pg (ref 26.0–34.0)
MCHC: 34.7 g/dL (ref 30.0–36.0)
MCV: 94.2 fL (ref 80.0–100.0)
Platelets: 288 10*3/uL (ref 150–400)
RBC: 4.34 MIL/uL (ref 3.87–5.11)
RDW: 13.1 % (ref 11.5–15.5)
WBC: 8.8 10*3/uL (ref 4.0–10.5)
nRBC: 0 % (ref 0.0–0.2)

## 2021-09-27 LAB — LIPASE, BLOOD: Lipase: 72 U/L — ABNORMAL HIGH (ref 11–51)

## 2021-09-27 LAB — TROPONIN I (HIGH SENSITIVITY)
Troponin I (High Sensitivity): 5 ng/L (ref <18)
Troponin I (High Sensitivity): 5 ng/L (ref <18)

## 2021-09-27 MED ORDER — ONDANSETRON HCL 4 MG/2ML IJ SOLN
4.0000 mg | Freq: Once | INTRAMUSCULAR | Status: AC
  Administered 2021-09-27: 4 mg via INTRAVENOUS
  Filled 2021-09-27: qty 2

## 2021-09-27 MED ORDER — SODIUM CHLORIDE 0.9 % IV BOLUS
1000.0000 mL | Freq: Once | INTRAVENOUS | Status: AC
  Administered 2021-09-27: 1000 mL via INTRAVENOUS

## 2021-09-27 MED ORDER — SUCRALFATE 1 GM/10ML PO SUSP: 1.0000 g | Freq: Four times a day (QID) | ORAL | 0 refills | Status: DC

## 2021-09-27 MED ORDER — IOHEXOL 300 MG/ML  SOLN
80.0000 mL | Freq: Once | INTRAMUSCULAR | Status: AC | PRN
  Administered 2021-09-27: 75 mL via INTRAVENOUS

## 2021-09-27 MED ORDER — ONDANSETRON 4 MG PO TBDP: 4.0000 mg | ORAL_TABLET | Freq: Three times a day (TID) | ORAL | 0 refills | Status: DC | PRN

## 2021-09-27 NOTE — ED Triage Notes (Signed)
Pt to ED with family for loss of appetite, n/v for the past week. Pt denies pain. NAD noted.
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
## 2021-09-27 NOTE — ED Provider Notes (Signed)
Emergency Medicine Provider Triage Evaluation Note  Tasha Avila , a 87 y.o. female  was evaluated in triage.  Pt complains of poor appetite with nausea and vomiting for 1 to 1-1/2 weeks.  Patient has been increasingly weak over that time, family denies any fevers, cough, chest pain, shortness of breath, or dysuria.  Review of Systems  Positive: Nausea, vomiting, decreased appetite, generalized weakness. Negative: Fever, cough, chest pain, shortness of breath, abdominal pain.  Physical Exam  BP 126/66   Pulse 84   Temp 98.6 F (37 C) (Oral)   Resp 20   Ht 5\' 2"  (1.575 m)   Wt 54.4 kg   SpO2 96%   BMI 21.95 kg/m  Gen:   Awake, no distress Resp:  Normal effort, clear to auscultation bilaterally. MSK:   Moves extremities without difficulty Other:  No abdominal tenderness noted.  Medical Decision Making  Medically screening exam initiated at 2:13 PM.  Appropriate orders placed.  Tasha Avila was informed that the remainder of the evaluation will be completed by another provider, this initial triage assessment does not replace that evaluation, and the importance of remaining in the ED until their evaluation is complete.  87 year old female presents to the ED complaining of greater than 1 week of nausea, vomiting, poor p.o. intake, and worsening generalized weakness.  We will check EKG, labs, chest x-ray, and UA.   Blake Divine, MD 09/27/21 1414

## 2021-10-05 ENCOUNTER — Other Ambulatory Visit: Payer: Self-pay

## 2021-10-05 ENCOUNTER — Encounter: Payer: Self-pay | Admitting: Internal Medicine

## 2021-10-05 ENCOUNTER — Ambulatory Visit (INDEPENDENT_AMBULATORY_CARE_PROVIDER_SITE_OTHER): Payer: Medicare Other | Admitting: Internal Medicine

## 2021-10-05 VITALS — BP 128/68 | HR 51 | Temp 98.0°F | Resp 12 | Ht 62.0 in | Wt 128.1 lb

## 2021-10-05 DIAGNOSIS — K219 Gastro-esophageal reflux disease without esophagitis: Secondary | ICD-10-CM

## 2021-10-05 DIAGNOSIS — IMO0002 Reserved for concepts with insufficient information to code with codable children: Secondary | ICD-10-CM

## 2021-10-05 DIAGNOSIS — E7800 Pure hypercholesterolemia, unspecified: Secondary | ICD-10-CM

## 2021-10-05 DIAGNOSIS — R112 Nausea with vomiting, unspecified: Secondary | ICD-10-CM | POA: Diagnosis not present

## 2021-10-05 DIAGNOSIS — I10 Essential (primary) hypertension: Secondary | ICD-10-CM

## 2021-10-05 DIAGNOSIS — R634 Abnormal weight loss: Secondary | ICD-10-CM

## 2021-10-05 DIAGNOSIS — R7989 Other specified abnormal findings of blood chemistry: Secondary | ICD-10-CM

## 2021-10-05 DIAGNOSIS — E039 Hypothyroidism, unspecified: Secondary | ICD-10-CM | POA: Diagnosis not present

## 2021-10-05 DIAGNOSIS — R413 Other amnesia: Secondary | ICD-10-CM

## 2021-10-05 DIAGNOSIS — C8220 Follicular lymphoma grade III, unspecified, unspecified site: Secondary | ICD-10-CM | POA: Diagnosis not present

## 2021-10-05 LAB — BASIC METABOLIC PANEL
BUN: 9 mg/dL (ref 6–23)
CO2: 28 mEq/L (ref 19–32)
Calcium: 9 mg/dL (ref 8.4–10.5)
Chloride: 100 mEq/L (ref 96–112)
Creatinine, Ser: 0.9 mg/dL (ref 0.40–1.20)
GFR: 56.08 mL/min — ABNORMAL LOW (ref >60.00)
Glucose, Bld: 101 mg/dL — ABNORMAL HIGH (ref 70–99)
Potassium: 4.1 mEq/L (ref 3.5–5.1)
Sodium: 136 mEq/L (ref 135–145)

## 2021-10-05 LAB — URINALYSIS, ROUTINE W REFLEX MICROSCOPIC
Bilirubin Urine: NEGATIVE
Hgb urine dipstick: NEGATIVE
Nitrite: NEGATIVE
RBC / HPF: NONE SEEN (ref 0–?)
Specific Gravity, Urine: 1.015 (ref 1.000–1.030)
Total Protein, Urine: NEGATIVE
Urine Glucose: NEGATIVE
Urobilinogen, UA: 0.2 (ref 0.0–1.0)
pH: 6 (ref 5.0–8.0)

## 2021-10-05 LAB — HEPATIC FUNCTION PANEL
ALT: 8 U/L (ref 0–35)
AST: 15 U/L (ref 0–37)
Albumin: 3.8 g/dL (ref 3.5–5.2)
Alkaline Phosphatase: 37 U/L — ABNORMAL LOW (ref 39–117)
Bilirubin, Direct: 0.1 mg/dL (ref 0.0–0.3)
Total Bilirubin: 0.4 mg/dL (ref 0.2–1.2)
Total Protein: 6.1 g/dL (ref 6.0–8.3)

## 2021-10-05 LAB — LIPASE: Lipase: 63 U/L — ABNORMAL HIGH (ref 11.0–59.0)

## 2021-10-05 NOTE — Progress Notes (Signed)
Patient ID: Tasha Avila, female   DOB: October 04, 1928, 87 y.o.   MRN: [id number]   Subjective:    Patient ID: Tasha Avila, female    DOB: 24-Jun-1928, 87 y.o.   MRN: [id number]  This visit occurred during the SARS-CoV-2 public health emergency.  Safety protocols were in place, including screening questions prior to the visit, additional usage of staff PPE, and extensive cleaning of exam room while observing appropriate contact time as indicated for disinfecting solutions.   Patient here for ER follow up.   Chief Complaint  Patient presents with   Follow-up    ED for Nausea and Emesis on 09/27/21 last Emesis was on 09/29/21   .   HPI Evaluated in ER 09/27/21 for persistent nausea and vomiting.  CT - no acute intra-abdominal or pelvic pathology.  Treated - discharged.  Since discharge she has had no further nausea or vomiting.  Still with some decreased appetite per daughter, but she is eating.  Weight has started coming up some from ER visit.  Still down.  Taking carafate.  No fever.  No chest pain.  Breathing stable.  No cough or congestion.  Staying with her daughter.     Past Medical History:  Diagnosis Date   Anemia    Arthritis    GERD (gastroesophageal reflux disease)    Hypercholesterolemia    Hypertension    Hypothyroidism    Past Surgical History:  Procedure Laterality Date   ABDOMINAL HYSTERECTOMY  1960s   bladder tack at the same time, ovaries not removed   LAPAROSCOPIC CHOLECYSTECTOMY  5/11   Family History  Problem Relation Age of Onset   Stroke Mother    Lymphoma Brother    Diabetes Brother    Diabetes Brother        x2   Thyroid disease Brother    Stroke Brother    Social History   Socioeconomic History   Marital status: Widowed    Spouse name: Not on file   Number of children: 2   Years of education: Not on file   Highest education level: Not on file  Occupational History   Not on file  Tobacco Use   Smoking status: Never   Smokeless  tobacco: Never  Vaping Use   Vaping Use: Never used  Substance and Sexual Activity   Alcohol use: No    Alcohol/week: 0.0 standard drinks   Drug use: No   Sexual activity: Not on file  Other Topics Concern   Not on file  Social History Narrative   Not on file   Social Determinants of Health   Financial Resource Strain: Low Risk    Difficulty of Paying Living Expenses: Not hard at all  Food Insecurity: No Food Insecurity   Worried About Charity fundraiser in the Last Year: Never true   [place] in the Last Year: Never true  Transportation Needs: No Transportation Needs   Lack of Transportation (Medical): No   Lack of Transportation (Non-Medical): No  Physical Activity: Not on file  Stress: No Stress Concern Present   Feeling of Stress : Not at all  Social Connections: Unknown   Frequency of Communication with Friends and Family: Not on file   Frequency of Social Gatherings with Friends and Family: More than three times a week   Attends Religious Services: Not on file   Active Member of Clubs or Organizations: Not on file   Attends Archivist Meetings: Not on  file   Marital Status: Widowed     Review of Systems  Constitutional:        Decreased appetite.  Weight loss.   HENT:  Negative for congestion and sinus pressure.   Respiratory:  Negative for cough, chest tightness and shortness of breath.   Cardiovascular:  Negative for chest pain, palpitations and leg swelling.  Gastrointestinal:        No nausea or vomiting now.  No abdominal pain.    Genitourinary:  Negative for difficulty urinating and dysuria.  Musculoskeletal:  Negative for joint swelling and myalgias.  Skin:  Negative for color change and rash.  Neurological:  Negative for dizziness, light-headedness and headaches.  Psychiatric/Behavioral:  Negative for agitation and dysphoric mood.       Objective:     BP 128/68   Pulse (!) 51   Temp 98 F (36.7 C) (Oral)   Resp 12   Ht 5\' 2"   (1.575 m)   Wt 128 lb 2 oz (58.1 kg)   SpO2 99%   BMI 23.43 kg/m  Wt Readings from Last 3 Encounters:  10/05/21 128 lb 2 oz (58.1 kg)  09/27/21 120 lb (54.4 kg)  08/18/21 141 lb (64 kg)    Physical Exam Vitals reviewed.  Constitutional:      General: She is not in acute distress.    Appearance: Normal appearance.  HENT:     Head: Normocephalic and atraumatic.     Right Ear: External ear normal.     Left Ear: External ear normal.  Eyes:     General: No scleral icterus.       Right eye: No discharge.        Left eye: No discharge.     Conjunctiva/sclera: Conjunctivae normal.  Neck:     Thyroid: No thyromegaly.  Cardiovascular:     Rate and Rhythm: Normal rate and regular rhythm.  Pulmonary:     Effort: No respiratory distress.     Breath sounds: Normal breath sounds. No wheezing.  Abdominal:     General: Bowel sounds are normal.     Palpations: Abdomen is soft.     Tenderness: There is no abdominal tenderness.  Musculoskeletal:        General: No swelling or tenderness.     Cervical back: Neck supple. No tenderness.  Lymphadenopathy:     Cervical: No cervical adenopathy.  Skin:    Findings: No erythema or rash.  Neurological:     Mental Status: She is alert.  Psychiatric:        Mood and Affect: Mood normal.        Behavior: Behavior normal.     Outpatient Encounter Medications as of 10/05/2021  Medication Sig   acetaminophen (TYLENOL) 500 MG tablet Take 500 mg by mouth every 6 (six) hours as needed.   cephALEXin (KEFLEX) 500 MG capsule Take 1 capsule (500 mg total) by mouth 2 (two) times daily.   Cyanocobalamin (B-12) 3000 MCG CAPS Take 3,000 mcg by mouth daily.   DULoxetine (CYMBALTA) 60 MG capsule Take 1 capsule (60 mg total) by mouth daily.   Homeopathic Products ([employer] LEG CRAMPS PAIN RELIEF PO) Take by mouth as needed.   levothyroxine (SYNTHROID) 88 MCG tablet Take 1 tablet (88 mcg total) by mouth daily.   lisinopril (ZESTRIL) 40 MG tablet Take 1 tablet (40  mg total) by mouth daily.   ondansetron (ZOFRAN ODT) 4 MG disintegrating tablet Take 1 tablet (4 mg total) by mouth every  8 (eight) hours as needed for nausea or vomiting.   pantoprazole (PROTONIX) 40 MG tablet Take 1 tablet (40 mg total) by mouth daily.   Propylene Glycol (SYSTANE BALANCE OP) Apply 1 drop to eye as needed.   rosuvastatin (CRESTOR) 5 MG tablet TAKE 1 TABLET BY MOUTH ON MONDAY, WEDNESDAY AND FRIDAY   SALINE NASAL SPRAY NA Place into the nose as needed.   [DISCONTINUED] sucralfate (CARAFATE) 1 GM/10ML suspension Take 10 mLs (1 g total) by mouth 4 (four) times daily for 21 days.   No facility-administered encounter medications on file as of 10/05/2021.     Lab Results  Component Value Date   WBC 8.8 09/27/2021   HGB 14.2 09/27/2021   HCT 40.9 09/27/2021   PLT 288 09/27/2021   GLUCOSE 101 (H) 10/05/2021   CHOL 234 (H) 01/01/2020   TRIG 103.0 01/01/2020   HDL 71.70 01/01/2020   LDLDIRECT 123.3 10/24/2012   LDLCALC 141 (H) 01/01/2020   ALT 8 10/05/2021   AST 15 10/05/2021   NA 136 10/05/2021   K 4.1 10/05/2021   CL 100 10/05/2021   CREATININE 0.90 10/05/2021   BUN 9 10/05/2021   CO2 28 10/05/2021   TSH 0.83 09/19/2021   INR 1.1 05/24/2020   HGBA1C 5.9 08/09/2021    DG Chest 2 View  Result Date: 09/27/2021 CLINICAL DATA:  Weakness EXAM: CHEST - 2 VIEW COMPARISON:  03/28/2018 FINDINGS: The heart size and mediastinal contours are within normal limits. Atherosclerotic calcification of the aortic knob. No focal airspace consolidation, pleural effusion, or pneumothorax. The visualized skeletal structures appear demineralized. Advanced degenerative changes of the right shoulder. IMPRESSION: No active cardiopulmonary disease. Electronically Signed   By: Davina Poke D.O.   On: 09/27/2021 15:20   CT Abdomen Pelvis W Contrast  Result Date: 09/27/2021 CLINICAL DATA:  Epigastric pain. EXAM: CT ABDOMEN AND PELVIS WITH CONTRAST TECHNIQUE: Multidetector CT imaging of the  abdomen and pelvis was performed using the standard protocol following bolus administration of intravenous contrast. CONTRAST:  75mL OMNIPAQUE IOHEXOL 300 MG/ML  SOLN COMPARISON:  CT abdomen pelvis dated 05/24/2020. FINDINGS: Lower chest: Bibasilar linear atelectasis/scarring. There is coronary vascular calcification. No intra-abdominal free air or free fluid. Hepatobiliary: The liver is unremarkable. There is mild intrahepatic biliary ductal dilatation, likely post cholecystectomy. No retained calcified stone noted in the central CBD. Pancreas: Unremarkable. No pancreatic ductal dilatation or surrounding inflammatory changes. Spleen: Normal in size without focal abnormality. Adrenals/Urinary Tract: The adrenal glands unremarkable. The there is no hydronephrosis on either side. There is symmetric enhancement and excretion of contrast by both kidneys. There is a 1 cm right renal upper pole cyst. The visualized ureters and urinary bladder appear unremarkable. Stomach/Bowel: Small scattered sigmoid diverticula without active inflammatory changes. There is no bowel obstruction or active inflammation. The appendix is normal. Vascular/Lymphatic: Advanced aortoiliac atherosclerotic disease. The IVC is unremarkable. No portal venous gas. Top-normal mesenteric lymph nodes, nonspecific. Reproductive: Hysterectomy.  No adnexal masses. Other: Anterior pelvic hernia repair mesh. Musculoskeletal: Osteopenia with degenerative changes of the spine. No acute osseous pathology. Scoliosis. IMPRESSION: 1. No acute intra-abdominal or pelvic pathology. 2. Small scattered sigmoid diverticula. No bowel obstruction. Normal appendix. 3. Aortic Atherosclerosis (ICD10-I70.0). Electronically Signed   By: Anner Crete M.D.   On: 09/27/2021 21:37       Assessment & Plan:   Problem List Items Addressed This Visit     GERD (gastroesophageal reflux disease)    Continue protonix.  Hypercholesterolemia    Follow lipid panel.  Continue crestor.       Hypertension    Continue lisinopril.  Blood pressure is doing well.  Follow pressures.  Follow metabolic panel. No changes today.       Hypothyroidism    On thyroid replacement.  Follow tsh.        Memory change    Discussed.  Check urine to confirm no infection.       Relevant Orders   Urine Culture (Completed)   Urinalysis, Routine w reflex microscopic (Completed)   Nausea with vomiting    Resolved.  Discussed fiber to help regulate bowels.        Non-Hodgkin's lymphoma [facility])    Has been evaluated by oncology.  CT as outlined.        Relevant Medications   cephALEXin (KEFLEX) 500 MG capsule   Pure hypercholesterolemia    Follow lipid panel.       Relevant Orders   Hepatic function panel (Completed)   Weight loss - Primary    Lost more weight with recent nausea and vomiting.  Eating now.  Weight has improved some. f ollow.       Relevant Orders   Lipase (Completed)   Other Visit Diagnoses     Increase in creatinine       Relevant Orders   Basic metabolic panel (Completed)        Einar Pheasant, MD

## 2021-10-07 ENCOUNTER — Telehealth: Payer: Self-pay | Admitting: Internal Medicine

## 2021-10-07 ENCOUNTER — Other Ambulatory Visit: Payer: Self-pay

## 2021-10-07 LAB — URINE CULTURE
MICRO NUMBER:: 12583844
SPECIMEN QUALITY:: ADEQUATE

## 2021-10-07 MED ORDER — SUCRALFATE 1 GM/10ML PO SUSP: 1.0000 g | Freq: Four times a day (QID) | ORAL | 0 refills | Status: DC

## 2021-10-07 MED ORDER — CEPHALEXIN 500 MG PO CAPS: 500.0000 mg | ORAL_CAPSULE | Freq: Two times a day (BID) | ORAL | 0 refills | Status: DC

## 2021-10-07 NOTE — Telephone Encounter (Signed)
Ok to refill carafate suspension per discussion

## 2021-10-07 NOTE — Telephone Encounter (Signed)
Refilled. Patients daughter is aware

## 2021-10-07 NOTE — Telephone Encounter (Signed)
Patient was seen by Dr Nicki Reaper for a hospital follow up. Dr Nicki Reaper said she would refill patient's sucralfate (CARAFATE) 1 GM/10ML suspension that was prescribed by hospital doctor. Dr Nicki Reaper forgot to refill and patient will run out by tomorrow.

## 2021-10-09 ENCOUNTER — Encounter: Payer: Self-pay | Admitting: Internal Medicine

## 2021-10-09 NOTE — Assessment & Plan Note (Signed)
Follow lipid panel.   

## 2021-10-09 NOTE — Assessment & Plan Note (Signed)
Discussed.  Check urine to confirm no infection.

## 2021-10-09 NOTE — Assessment & Plan Note (Signed)
Continue protonix  

## 2021-10-09 NOTE — Assessment & Plan Note (Signed)
Has been evaluated by oncology.  CT as outlined.

## 2021-10-09 NOTE — Assessment & Plan Note (Addendum)
Resolved.  Discussed fiber to help regulate bowels.

## 2021-10-09 NOTE — Assessment & Plan Note (Signed)
Follow lipid panel. Continue crestor.  

## 2021-10-09 NOTE — Assessment & Plan Note (Signed)
Continue lisinopril.  Blood pressure is doing well.  Follow pressures.  Follow metabolic panel. No changes today.

## 2021-10-09 NOTE — Assessment & Plan Note (Signed)
Lost more weight with recent nausea and vomiting.  Eating now.  Weight has improved some. f ollow.

## 2021-10-09 NOTE — Assessment & Plan Note (Signed)
On thyroid replacement.  Follow tsh.  

## 2021-10-30 ENCOUNTER — Other Ambulatory Visit: Payer: Self-pay | Admitting: Internal Medicine

## 2021-11-10 ENCOUNTER — Encounter: Payer: Self-pay | Admitting: Internal Medicine

## 2021-11-10 ENCOUNTER — Ambulatory Visit (INDEPENDENT_AMBULATORY_CARE_PROVIDER_SITE_OTHER): Payer: Medicare Other | Admitting: Internal Medicine

## 2021-11-10 ENCOUNTER — Other Ambulatory Visit: Payer: Self-pay

## 2021-11-10 DIAGNOSIS — N2889 Other specified disorders of kidney and ureter: Secondary | ICD-10-CM | POA: Diagnosis not present

## 2021-11-10 DIAGNOSIS — D649 Anemia, unspecified: Secondary | ICD-10-CM | POA: Diagnosis not present

## 2021-11-10 DIAGNOSIS — R739 Hyperglycemia, unspecified: Secondary | ICD-10-CM | POA: Diagnosis not present

## 2021-11-10 DIAGNOSIS — E7800 Pure hypercholesterolemia, unspecified: Secondary | ICD-10-CM | POA: Diagnosis not present

## 2021-11-10 DIAGNOSIS — R300 Dysuria: Secondary | ICD-10-CM | POA: Diagnosis not present

## 2021-11-10 DIAGNOSIS — E039 Hypothyroidism, unspecified: Secondary | ICD-10-CM

## 2021-11-10 DIAGNOSIS — I10 Essential (primary) hypertension: Secondary | ICD-10-CM

## 2021-11-10 DIAGNOSIS — K219 Gastro-esophageal reflux disease without esophagitis: Secondary | ICD-10-CM

## 2021-11-10 DIAGNOSIS — C8220 Follicular lymphoma grade III, unspecified, unspecified site: Secondary | ICD-10-CM

## 2021-11-10 DIAGNOSIS — F32A Depression, unspecified: Secondary | ICD-10-CM | POA: Diagnosis not present

## 2021-11-10 NOTE — Progress Notes (Signed)
Patient ID: Santita Hunsberger, female   DOB: 1928-01-07, 87 y.o.   MRN: [id number]   Subjective:    Patient ID: [name], female    DOB: 02/07/1928, 87 y.o.   MRN: [id number]  This visit occurred during the SARS-CoV-2 public health emergency.  Safety protocols were in place, including screening questions prior to the visit, additional usage of staff PPE, and extensive cleaning of exam room while observing appropriate contact time as indicated for disinfecting solutions.   Patient here for a scheduled follow up.   Chief Complaint  Patient presents with   Follow-up    3 mo   .   HPI She is accompanied by her daughter.  History obtained from both of them.  Here to follow up regarding her blood pressure and acid reflux.  Appears things are relatively stable.  She is staying with her daughter.  Daughter reports eating well.  Weight is stable.  No vomiting.  No increased acid reflux reported.  Bowels moving.  Concern about possible UTI.  Wants urine checked.  No fever.     Past Medical History:  Diagnosis Date   Anemia    Arthritis    GERD (gastroesophageal reflux disease)    Hypercholesterolemia    Hypertension    Hypothyroidism    Past Surgical History:  Procedure Laterality Date   ABDOMINAL HYSTERECTOMY  1960s   bladder tack at the same time, ovaries not removed   LAPAROSCOPIC CHOLECYSTECTOMY  5/11   Family History  Problem Relation Age of Onset   Stroke Mother    Lymphoma Brother    Diabetes Brother    Diabetes Brother        x2   Thyroid disease Brother    Stroke Brother    Social History   Socioeconomic History   Marital status: Widowed    Spouse name: Not on file   Number of children: 2   Years of education: Not on file   Highest education level: Not on file  Occupational History   Not on file  Tobacco Use   Smoking status: Never   Smokeless tobacco: Never  Vaping Use   Vaping Use: Never used  Substance and Sexual Activity   Alcohol use: No     Alcohol/week: 0.0 standard drinks   Drug use: No   Sexual activity: Not on file  Other Topics Concern   Not on file  Social History Narrative   Not on file   Social Determinants of Health   Financial Resource Strain: Low Risk    Difficulty of Paying Living Expenses: Not hard at all  Food Insecurity: No Food Insecurity   Worried About Charity fundraiser in the Last Year: Never true   [place] in the Last Year: Never true  Transportation Needs: No Transportation Needs   Lack of Transportation (Medical): No   Lack of Transportation (Non-Medical): No  Physical Activity: Not on file  Stress: No Stress Concern Present   Feeling of Stress : Not at all  Social Connections: Unknown   Frequency of Communication with Friends and Family: Not on file   Frequency of Social Gatherings with Friends and Family: More than three times a week   Attends Religious Services: Not on file   Active Member of Clubs or Organizations: Not on file   Attends Archivist Meetings: Not on file   Marital Status: Widowed     Review of Systems  Constitutional:  Negative for appetite  change and unexpected weight change.  HENT:  Negative for congestion and sinus pressure.   Respiratory:  Negative for cough and chest tightness.        Breathing stable.   Cardiovascular:  Negative for chest pain, palpitations and leg swelling.  Gastrointestinal:  Negative for abdominal pain, diarrhea, nausea and vomiting.  Genitourinary:  Positive for dysuria. Negative for difficulty urinating.  Musculoskeletal:  Negative for joint swelling and myalgias.  Neurological:  Negative for dizziness, light-headedness and headaches.  Psychiatric/Behavioral:  Negative for agitation and dysphoric mood.       Objective:     BP 136/78   Pulse 72   Temp 98.1 F (36.7 C) (Oral)   Ht '5\' 2"'  (1.575 m)   Wt 128 lb 6.4 oz (58.2 kg)   SpO2 99%   BMI 23.48 kg/m  Wt Readings from Last 3 Encounters:  11/10/21 128 lb 6.4  oz (58.2 kg)  10/05/21 128 lb 2 oz (58.1 kg)  09/27/21 120 lb (54.4 kg)    Physical Exam Vitals reviewed.  Constitutional:      General: She is not in acute distress.    Appearance: Normal appearance.  HENT:     Head: Normocephalic and atraumatic.     Right Ear: External ear normal.     Left Ear: External ear normal.  Eyes:     General: No scleral icterus.       Right eye: No discharge.        Left eye: No discharge.     Conjunctiva/sclera: Conjunctivae normal.  Neck:     Thyroid: No thyromegaly.  Cardiovascular:     Rate and Rhythm: Normal rate and regular rhythm.  Pulmonary:     Effort: No respiratory distress.     Breath sounds: Normal breath sounds. No wheezing.  Abdominal:     General: Bowel sounds are normal.     Palpations: Abdomen is soft.     Tenderness: There is no abdominal tenderness.  Musculoskeletal:        General: No swelling or tenderness.     Cervical back: Neck supple. No tenderness.  Lymphadenopathy:     Cervical: No cervical adenopathy.  Skin:    Findings: No erythema or rash.  Neurological:     Mental Status: She is alert.  Psychiatric:        Mood and Affect: Mood normal.        Behavior: Behavior normal.     Outpatient Encounter Medications as of 11/10/2021  Medication Sig   acetaminophen (TYLENOL) 500 MG tablet Take 500 mg by mouth every 6 (six) hours as needed.   Cyanocobalamin (B-12) 3000 MCG CAPS Take 3,000 mcg by mouth daily.   DULoxetine (CYMBALTA) 60 MG capsule Take 1 capsule by mouth once daily   Homeopathic Products ([employer] LEG CRAMPS PAIN RELIEF PO) Take by mouth as needed.   levothyroxine (SYNTHROID) 88 MCG tablet Take 1 tablet (88 mcg total) by mouth daily.   lisinopril (ZESTRIL) 40 MG tablet Take 1 tablet by mouth once daily   pantoprazole (PROTONIX) 40 MG tablet Take 1 tablet (40 mg total) by mouth daily.   Propylene Glycol (SYSTANE BALANCE OP) Apply 1 drop to eye as needed.   rosuvastatin (CRESTOR) 5 MG tablet TAKE 1 TABLET BY  MOUTH ON MONDAY, WEDNESDAY AND FRIDAY   SALINE NASAL SPRAY NA Place into the nose as needed.   sucralfate (CARAFATE) 1 GM/10ML suspension Take 10 mLs (1 g total) by mouth 4 (four) times daily  for 21 days.   [DISCONTINUED] cephALEXin (KEFLEX) 500 MG capsule Take 1 capsule (500 mg total) by mouth 2 (two) times daily. (Patient not taking: Reported on 11/10/2021)   [DISCONTINUED] ondansetron (ZOFRAN ODT) 4 MG disintegrating tablet Take 1 tablet (4 mg total) by mouth every 8 (eight) hours as needed for nausea or vomiting. (Patient not taking: Reported on 11/10/2021)   No facility-administered encounter medications on file as of 11/10/2021.     Lab Results  Component Value Date   WBC 8.8 09/27/2021   HGB 14.2 09/27/2021   HCT 40.9 09/27/2021   PLT 288 09/27/2021   GLUCOSE 101 (H) 10/05/2021   CHOL 234 (H) 01/01/2020   TRIG 103.0 01/01/2020   HDL 71.70 01/01/2020   LDLDIRECT 123.3 10/24/2012   LDLCALC 141 (H) 01/01/2020   ALT 8 10/05/2021   AST 15 10/05/2021   NA 136 10/05/2021   K 4.1 10/05/2021   CL 100 10/05/2021   CREATININE 0.90 10/05/2021   BUN 9 10/05/2021   CO2 28 10/05/2021   TSH 0.83 09/19/2021   INR 1.1 05/24/2020   HGBA1C 5.9 08/09/2021    DG Chest 2 View  Result Date: 09/27/2021 CLINICAL DATA:  Weakness EXAM: CHEST - 2 VIEW COMPARISON:  03/28/2018 FINDINGS: The heart size and mediastinal contours are within normal limits. Atherosclerotic calcification of the aortic knob. No focal airspace consolidation, pleural effusion, or pneumothorax. The visualized skeletal structures appear demineralized. Advanced degenerative changes of the right shoulder. IMPRESSION: No active cardiopulmonary disease. Electronically Signed   By: Davina Poke D.O.   On: 09/27/2021 15:20   CT Abdomen Pelvis W Contrast  Result Date: 09/27/2021 CLINICAL DATA:  Epigastric pain. EXAM: CT ABDOMEN AND PELVIS WITH CONTRAST TECHNIQUE: Multidetector CT imaging of the abdomen and pelvis was performed using  the standard protocol following bolus administration of intravenous contrast. CONTRAST:  8m OMNIPAQUE IOHEXOL 300 MG/ML  SOLN COMPARISON:  CT abdomen pelvis dated 05/24/2020. FINDINGS: Lower chest: Bibasilar linear atelectasis/scarring. There is coronary vascular calcification. No intra-abdominal free air or free fluid. Hepatobiliary: The liver is unremarkable. There is mild intrahepatic biliary ductal dilatation, likely post cholecystectomy. No retained calcified stone noted in the central CBD. Pancreas: Unremarkable. No pancreatic ductal dilatation or surrounding inflammatory changes. Spleen: Normal in size without focal abnormality. Adrenals/Urinary Tract: The adrenal glands unremarkable. The there is no hydronephrosis on either side. There is symmetric enhancement and excretion of contrast by both kidneys. There is a 1 cm right renal upper pole cyst. The visualized ureters and urinary bladder appear unremarkable. Stomach/Bowel: Small scattered sigmoid diverticula without active inflammatory changes. There is no bowel obstruction or active inflammation. The appendix is normal. Vascular/Lymphatic: Advanced aortoiliac atherosclerotic disease. The IVC is unremarkable. No portal venous gas. Top-normal mesenteric lymph nodes, nonspecific. Reproductive: Hysterectomy.  No adnexal masses. Other: Anterior pelvic hernia repair mesh. Musculoskeletal: Osteopenia with degenerative changes of the spine. No acute osseous pathology. Scoliosis. IMPRESSION: 1. No acute intra-abdominal or pelvic pathology. 2. Small scattered sigmoid diverticula. No bowel obstruction. Normal appendix. 3. Aortic Atherosclerosis (ICD10-I70.0). Electronically Signed   By: AAnner CreteM.D.   On: 09/27/2021 21:37       Assessment & Plan:   Problem List Items Addressed This Visit     Anemia    Follow cbc.       Dysuria    Check urinalysis to confirm no infection.       Relevant Orders   Urinalysis, Routine w reflex microscopic  (Completed)   Urine Culture  GERD (gastroesophageal reflux disease)    Continue protonix.        Hyperglycemia    Follow met b and a1c.       Hypertension    Continue lisinopril.  Blood pressure as outlined.  Follow pressures.  Follow metabolic panel. No changes today.       Hypothyroidism    On thyroid replacement.  Follow tsh.        Mild depression    Continue cymbalta.  Stable.       Non-Hodgkin's lymphoma [facility])    Has been evaluated by oncology.  CT in ER recently as outlined previously.  Desires no further follow up.        Pure hypercholesterolemia    Follow lipid panel.       Renal mass    Previously evaluated by urology and nephrology.  Has declined f/u scan or further evaluation.  Did have CT in ER recently.  CT revealed - 1 cm right renal upper pole cyst.          Einar Pheasant, MD

## 2021-11-11 LAB — URINALYSIS, ROUTINE W REFLEX MICROSCOPIC
Bilirubin Urine: NEGATIVE
Hgb urine dipstick: NEGATIVE
Ketones, ur: NEGATIVE
Nitrite: NEGATIVE
Specific Gravity, Urine: <=1.005 — AB (ref 1.000–1.030)
Total Protein, Urine: NEGATIVE
Urine Glucose: NEGATIVE
Urobilinogen, UA: 0.2 (ref 0.0–1.0)
pH: 7 (ref 5.0–8.0)

## 2021-11-12 ENCOUNTER — Encounter: Payer: Self-pay | Admitting: Internal Medicine

## 2021-11-12 LAB — URINE CULTURE
MICRO NUMBER:: 12732014
SPECIMEN QUALITY:: ADEQUATE

## 2021-11-12 NOTE — Assessment & Plan Note (Signed)
Has been evaluated by oncology.  CT in ER recently as outlined previously.  Desires no further follow up.

## 2021-11-12 NOTE — Assessment & Plan Note (Signed)
Check urinalysis to confirm no infection.

## 2021-11-12 NOTE — Assessment & Plan Note (Signed)
Continue lisinopril.  Blood pressure as outlined.  Follow pressures.  Follow metabolic panel. No changes today.

## 2021-11-12 NOTE — Assessment & Plan Note (Addendum)
Previously evaluated by urology and nephrology.  Has declined f/u scan or further evaluation.  Did have CT in ER recently.  CT revealed - 1 cm right renal upper pole cyst.

## 2021-11-12 NOTE — Assessment & Plan Note (Signed)
Follow cbc.  

## 2021-11-12 NOTE — Assessment & Plan Note (Signed)
Continue protonix  

## 2021-11-12 NOTE — Assessment & Plan Note (Signed)
On thyroid replacement.  Follow tsh.  

## 2021-11-12 NOTE — Assessment & Plan Note (Signed)
Follow met b and a1c.  

## 2021-11-12 NOTE — Assessment & Plan Note (Signed)
Follow lipid panel.   

## 2021-11-12 NOTE — Assessment & Plan Note (Signed)
Continue cymbalta.  Stable.   

## 2021-11-14 ENCOUNTER — Other Ambulatory Visit: Payer: Self-pay

## 2021-11-14 MED ORDER — CEFDINIR 300 MG PO CAPS: 300.0000 mg | ORAL_CAPSULE | Freq: Two times a day (BID) | ORAL | 0 refills | Status: DC

## 2021-12-07 ENCOUNTER — Other Ambulatory Visit: Payer: Self-pay | Admitting: Internal Medicine

## 2021-12-12 DIAGNOSIS — E079 Disorder of thyroid, unspecified: Secondary | ICD-10-CM | POA: Diagnosis not present

## 2021-12-12 DIAGNOSIS — K219 Gastro-esophageal reflux disease without esophagitis: Secondary | ICD-10-CM | POA: Diagnosis not present

## 2021-12-12 DIAGNOSIS — R270 Ataxia, unspecified: Secondary | ICD-10-CM | POA: Diagnosis not present

## 2021-12-12 DIAGNOSIS — E7800 Pure hypercholesterolemia, unspecified: Secondary | ICD-10-CM | POA: Diagnosis not present

## 2021-12-12 DIAGNOSIS — R1310 Dysphagia, unspecified: Secondary | ICD-10-CM | POA: Diagnosis not present

## 2021-12-12 DIAGNOSIS — C8590 Non-Hodgkin lymphoma, unspecified, unspecified site: Secondary | ICD-10-CM | POA: Diagnosis not present

## 2021-12-12 DIAGNOSIS — I10 Essential (primary) hypertension: Secondary | ICD-10-CM | POA: Diagnosis not present

## 2021-12-12 DIAGNOSIS — R52 Pain, unspecified: Secondary | ICD-10-CM | POA: Diagnosis not present

## 2021-12-12 DIAGNOSIS — R0789 Other chest pain: Secondary | ICD-10-CM | POA: Diagnosis not present

## 2022-01-22 ENCOUNTER — Other Ambulatory Visit: Payer: Self-pay | Admitting: Internal Medicine

## 2022-02-10 ENCOUNTER — Other Ambulatory Visit: Payer: Self-pay

## 2022-02-10 ENCOUNTER — Ambulatory Visit (INDEPENDENT_AMBULATORY_CARE_PROVIDER_SITE_OTHER): Payer: Medicare Other | Admitting: Internal Medicine

## 2022-02-10 VITALS — BP 136/74 | HR 80 | Temp 97.9°F | Resp 16 | Ht 62.0 in | Wt 134.0 lb

## 2022-02-10 DIAGNOSIS — E7800 Pure hypercholesterolemia, unspecified: Secondary | ICD-10-CM

## 2022-02-10 DIAGNOSIS — M25561 Pain in right knee: Secondary | ICD-10-CM | POA: Diagnosis not present

## 2022-02-10 DIAGNOSIS — N2889 Other specified disorders of kidney and ureter: Secondary | ICD-10-CM

## 2022-02-10 DIAGNOSIS — D649 Anemia, unspecified: Secondary | ICD-10-CM | POA: Diagnosis not present

## 2022-02-10 DIAGNOSIS — R739 Hyperglycemia, unspecified: Secondary | ICD-10-CM | POA: Diagnosis not present

## 2022-02-10 DIAGNOSIS — E538 Deficiency of other specified B group vitamins: Secondary | ICD-10-CM | POA: Diagnosis not present

## 2022-02-10 DIAGNOSIS — E039 Hypothyroidism, unspecified: Secondary | ICD-10-CM | POA: Diagnosis not present

## 2022-02-10 DIAGNOSIS — G8929 Other chronic pain: Secondary | ICD-10-CM

## 2022-02-10 DIAGNOSIS — R413 Other amnesia: Secondary | ICD-10-CM

## 2022-02-10 DIAGNOSIS — K219 Gastro-esophageal reflux disease without esophagitis: Secondary | ICD-10-CM | POA: Diagnosis not present

## 2022-02-10 DIAGNOSIS — C8220 Follicular lymphoma grade III, unspecified, unspecified site: Secondary | ICD-10-CM | POA: Diagnosis not present

## 2022-02-10 DIAGNOSIS — I10 Essential (primary) hypertension: Secondary | ICD-10-CM | POA: Diagnosis not present

## 2022-02-10 DIAGNOSIS — M25562 Pain in left knee: Secondary | ICD-10-CM

## 2022-02-10 NOTE — Progress Notes (Signed)
Patient ID: Tasha Avila, female   DOB: June 26, 1928, 88 y.o.   MRN: [id number]   Subjective:    Patient ID: Tasha Avila, female    DOB: December 13, 1927, 88 y.o.   MRN: [id number]  This visit occurred during the SARS-CoV-2 public health emergency.  Safety protocols were in place, including screening questions prior to the visit, additional usage of staff PPE, and extensive cleaning of exam room while observing appropriate contact time as indicated for disinfecting solutions.   Patient here for a scheduled follow up.   Chief Complaint  Patient presents with   Hypertension   Hyperlipidemia   Hypothyroidism   .   HPI She is accompanied by her daughter.  History obtained from both of them.  Still with knee pain.  Discussed referral for possible injection/further w/up.  She declines.  She is eating.  No nausea or vomiting.  No chest pain.  Breathing stable.  No increased cough or congestion.  Bowels moving. Saw Dr Clayborn Bigness 12/2021.  Stable.  No changes made.    Past Medical History:  Diagnosis Date   Anemia    Arthritis    GERD (gastroesophageal reflux disease)    Hypercholesterolemia    Hypertension    Hypothyroidism    Past Surgical History:  Procedure Laterality Date   ABDOMINAL HYSTERECTOMY  1960s   bladder tack at the same time, ovaries not removed   LAPAROSCOPIC CHOLECYSTECTOMY  5/11   Family History  Problem Relation Age of Onset   Stroke Mother    Lymphoma Brother    Diabetes Brother    Diabetes Brother        x2   Thyroid disease Brother    Stroke Brother    Social History   Socioeconomic History   Marital status: Widowed    Spouse name: Not on file   Number of children: 2   Years of education: Not on file   Highest education level: Not on file  Occupational History   Not on file  Tobacco Use   Smoking status: Never   Smokeless tobacco: Never  Vaping Use   Vaping Use: Never used  Substance and Sexual Activity   Alcohol use: No    Alcohol/week:  0.0 standard drinks   Drug use: No   Sexual activity: Not on file  Other Topics Concern   Not on file  Social History Narrative   Not on file   Social Determinants of Health   Financial Resource Strain: Low Risk    Difficulty of Paying Living Expenses: Not hard at all  Food Insecurity: No Food Insecurity   Worried About Charity fundraiser in the Last Year: Never true   [place] in the Last Year: Never true  Transportation Needs: No Transportation Needs   Lack of Transportation (Medical): No   Lack of Transportation (Non-Medical): No  Physical Activity: Not on file  Stress: No Stress Concern Present   Feeling of Stress : Not at all  Social Connections: Unknown   Frequency of Communication with Friends and Family: Not on file   Frequency of Social Gatherings with Friends and Family: More than three times a week   Attends Religious Services: Not on file   Active Member of Clubs or Organizations: Not on file   Attends Archivist Meetings: Not on file   Marital Status: Widowed     Review of Systems  Constitutional:  Negative for appetite change and unexpected weight change.  HENT:  Negative for congestion and sinus pressure.   Respiratory:  Negative for cough and chest tightness.        Breathing stable.   Cardiovascular:  Negative for chest pain, palpitations and leg swelling.  Gastrointestinal:  Negative for abdominal pain, diarrhea, nausea and vomiting.  Genitourinary:  Negative for difficulty urinating and dysuria.  Musculoskeletal:  Negative for myalgias.       Knee pain as outlined.   Skin:  Negative for color change and rash.  Neurological:  Negative for dizziness, light-headedness and headaches.  Psychiatric/Behavioral:  Negative for agitation and dysphoric mood.       Objective:     BP 136/74    Pulse 80    Temp 97.9 F (36.6 C)    Resp 16    Ht _0  (1.575 m)    Wt 134 lb (60.8 kg)    SpO2 97%    BMI 24.51 kg/m  Wt Readings from Last 3  Encounters:  02/10/22 134 lb (60.8 kg)  11/10/21 128 lb 6.4 oz (58.2 kg)  10/05/21 128 lb 2 oz (58.1 kg)    Physical Exam Vitals reviewed.  Constitutional:      General: She is not in acute distress.    Appearance: Normal appearance.  HENT:     Head: Normocephalic and atraumatic.     Right Ear: External ear normal.     Left Ear: External ear normal.  Eyes:     General: No scleral icterus.       Right eye: No discharge.        Left eye: No discharge.     Conjunctiva/sclera: Conjunctivae normal.  Neck:     Thyroid: No thyromegaly.  Cardiovascular:     Rate and Rhythm: Normal rate and regular rhythm.  Pulmonary:     Effort: No respiratory distress.     Breath sounds: Normal breath sounds. No wheezing.  Abdominal:     General: Bowel sounds are normal.     Palpations: Abdomen is soft.     Tenderness: There is no abdominal tenderness.  Musculoskeletal:        General: No swelling or tenderness.     Cervical back: Neck supple. No tenderness.  Lymphadenopathy:     Cervical: No cervical adenopathy.  Skin:    Findings: No erythema or rash.  Neurological:     Mental Status: She is alert.  Psychiatric:        Mood and Affect: Mood normal.        Behavior: Behavior normal.     Outpatient Encounter Medications as of 02/10/2022  Medication Sig   acetaminophen (TYLENOL) 500 MG tablet Take 500 mg by mouth every 6 (six) hours as needed.   Cyanocobalamin (B-12) 3000 MCG CAPS Take 3,000 mcg by mouth daily.   DULoxetine (CYMBALTA) 60 MG capsule Take 1 capsule by mouth once daily   Homeopathic Products ([employer] LEG CRAMPS PAIN RELIEF PO) Take by mouth as needed.   levothyroxine (SYNTHROID) 88 MCG tablet Take 1 tablet (88 mcg total) by mouth daily.   lisinopril (ZESTRIL) 40 MG tablet Take 1 tablet by mouth once daily   pantoprazole (PROTONIX) 40 MG tablet Take 1 tablet by mouth once daily   Propylene Glycol (SYSTANE BALANCE OP) Apply 1 drop to eye as needed.   rosuvastatin (CRESTOR) 5 MG  tablet TAKE 1 TABLET BY MOUTH ON MONDAY, WEDNESDAY AND FRIDAY   SALINE NASAL SPRAY NA Place into the nose as needed.   [DISCONTINUED] cefdinir (OMNICEF)  300 MG capsule Take 1 capsule (300 mg total) by mouth 2 (two) times daily.   [DISCONTINUED] sucralfate (CARAFATE) 1 GM/10ML suspension Take 10 mLs (1 g total) by mouth 4 (four) times daily for 21 days.   No facility-administered encounter medications on file as of 02/10/2022.     Lab Results  Component Value Date   WBC 8.8 09/27/2021   HGB 14.2 09/27/2021   HCT 40.9 09/27/2021   PLT 288 09/27/2021   GLUCOSE 98 02/10/2022   CHOL 234 (H) 01/01/2020   TRIG 103.0 01/01/2020   HDL 71.70 01/01/2020   LDLDIRECT 123.3 10/24/2012   LDLCALC 141 (H) 01/01/2020   ALT 10 02/10/2022   AST 13 02/10/2022   NA 139 02/10/2022   K 4.7 02/10/2022   CL 103 02/10/2022   CREATININE 0.78 02/10/2022   BUN 16 02/10/2022   CO2 27 02/10/2022   TSH 1.26 02/10/2022   INR 1.1 05/24/2020   HGBA1C 5.9 08/09/2021    DG Chest 2 View  Result Date: 09/27/2021 CLINICAL DATA:  Weakness EXAM: CHEST - 2 VIEW COMPARISON:  03/28/2018 FINDINGS: The heart size and mediastinal contours are within normal limits. Atherosclerotic calcification of the aortic knob. No focal airspace consolidation, pleural effusion, or pneumothorax. The visualized skeletal structures appear demineralized. Advanced degenerative changes of the right shoulder. IMPRESSION: No active cardiopulmonary disease. Electronically Signed   By: Davina Poke D.O.   On: 09/27/2021 15:20   CT Abdomen Pelvis W Contrast  Result Date: 09/27/2021 CLINICAL DATA:  Epigastric pain. EXAM: CT ABDOMEN AND PELVIS WITH CONTRAST TECHNIQUE: Multidetector CT imaging of the abdomen and pelvis was performed using the standard protocol following bolus administration of intravenous contrast. CONTRAST:  25m OMNIPAQUE IOHEXOL 300 MG/ML  SOLN COMPARISON:  CT abdomen pelvis dated 05/24/2020. FINDINGS: Lower chest: Bibasilar  linear atelectasis/scarring. There is coronary vascular calcification. No intra-abdominal free air or free fluid. Hepatobiliary: The liver is unremarkable. There is mild intrahepatic biliary ductal dilatation, likely post cholecystectomy. No retained calcified stone noted in the central CBD. Pancreas: Unremarkable. No pancreatic ductal dilatation or surrounding inflammatory changes. Spleen: Normal in size without focal abnormality. Adrenals/Urinary Tract: The adrenal glands unremarkable. The there is no hydronephrosis on either side. There is symmetric enhancement and excretion of contrast by both kidneys. There is a 1 cm right renal upper pole cyst. The visualized ureters and urinary bladder appear unremarkable. Stomach/Bowel: Small scattered sigmoid diverticula without active inflammatory changes. There is no bowel obstruction or active inflammation. The appendix is normal. Vascular/Lymphatic: Advanced aortoiliac atherosclerotic disease. The IVC is unremarkable. No portal venous gas. Top-normal mesenteric lymph nodes, nonspecific. Reproductive: Hysterectomy.  No adnexal masses. Other: Anterior pelvic hernia repair mesh. Musculoskeletal: Osteopenia with degenerative changes of the spine. No acute osseous pathology. Scoliosis. IMPRESSION: 1. No acute intra-abdominal or pelvic pathology. 2. Small scattered sigmoid diverticula. No bowel obstruction. Normal appendix. 3. Aortic Atherosclerosis (ICD10-I70.0). Electronically Signed   By: AAnner CreteM.D.   On: 09/27/2021 21:37       Assessment & Plan:   Problem List Items Addressed This Visit     Anemia    Follow cbc.       B12 deficiency    Check B12 level.       GERD (gastroesophageal reflux disease)    Continue protonix.        Hyperglycemia    Follow met b and a1c.       Hypertension - Primary    Continue lisinopril.  Blood pressure as outlined.  Follow pressures.  Follow metabolic panel. No changes today.       Relevant Orders    BASIC METABOLIC PANEL WITH GFR (Completed)   Hypothyroidism    On thyroid replacement.  Follow tsh.        Relevant Orders   TSH (Completed)   Knee pain    Persistent.  Discussed referral to ortho for further evaluation and question of need for injection.  She declines.       Memory change    Daughter concerned about repeating herself.  Check routine labs and B12       Relevant Orders   Vitamin B12 (Completed)   Non-Hodgkin's lymphoma ([place])    Has been evaluated by oncology.  Desires no further w/up.         Pure hypercholesterolemia    Follow lipid panel. Continue crestor.       Relevant Orders   Hepatic function panel (Completed)   Renal mass    Previously evaluated by urology and nephrology.  Has declined f/u scan or further evaluation.  Did have CT in ER previously.  CT revealed - 1 cm right renal upper pole cyst.          Einar Pheasant, MD

## 2022-02-11 LAB — BASIC METABOLIC PANEL WITH GFR
BUN: 16 mg/dL (ref 7–25)
CO2: 27 mmol/L (ref 20–32)
Calcium: 9.3 mg/dL (ref 8.6–10.4)
Chloride: 103 mmol/L (ref 98–110)
Creat: 0.78 mg/dL (ref 0.60–0.95)
Glucose, Bld: 98 mg/dL (ref 65–99)
Potassium: 4.7 mmol/L (ref 3.5–5.3)
Sodium: 139 mmol/L (ref 135–146)
eGFR: 72 mL/min/{1.73_m2} (ref >=60)

## 2022-02-11 LAB — HEPATIC FUNCTION PANEL
AG Ratio: 1.5 (calc) (ref 1.0–2.5)
ALT: 10 U/L (ref 6–29)
AST: 13 U/L (ref 10–35)
Albumin: 4.2 g/dL (ref 3.6–5.1)
Alkaline phosphatase (APISO): 45 U/L (ref 37–153)
Bilirubin, Direct: 0.1 mg/dL (ref 0.0–0.2)
Globulin: 2.8 g/dL (calc) (ref 1.9–3.7)
Indirect Bilirubin: 0.2 mg/dL (calc) (ref 0.2–1.2)
Total Bilirubin: 0.3 mg/dL (ref 0.2–1.2)
Total Protein: 7 g/dL (ref 6.1–8.1)

## 2022-02-11 LAB — VITAMIN B12: Vitamin B-12: 1551 pg/mL — ABNORMAL HIGH (ref 200–1100)

## 2022-02-11 LAB — TSH: TSH: 1.26 mIU/L (ref 0.40–4.50)

## 2022-02-12 ENCOUNTER — Encounter: Payer: Self-pay | Admitting: Internal Medicine

## 2022-02-12 NOTE — Assessment & Plan Note (Signed)
On thyroid replacement.  Follow tsh.  

## 2022-02-12 NOTE — Assessment & Plan Note (Signed)
Continue protonix  

## 2022-02-12 NOTE — Assessment & Plan Note (Signed)
Follow met b and a1c.  ?

## 2022-02-12 NOTE — Assessment & Plan Note (Signed)
Persistent.  Discussed referral to ortho for further evaluation and question of need for injection.  She declines.  

## 2022-02-12 NOTE — Assessment & Plan Note (Signed)
Has been evaluated by oncology.  Desires no further w/up.    ?

## 2022-02-12 NOTE — Assessment & Plan Note (Addendum)
Previously evaluated by urology and nephrology.  Has declined f/u scan or further evaluation.  Did have CT in ER previously.  CT revealed - 1 cm right renal upper pole cyst.   

## 2022-02-12 NOTE — Assessment & Plan Note (Signed)
Check B12 level. 

## 2022-02-12 NOTE — Assessment & Plan Note (Signed)
Continue lisinopril.  Blood pressure as outlined.  Follow pressures.  Follow metabolic panel. No changes today.  ?

## 2022-02-12 NOTE — Assessment & Plan Note (Signed)
Follow cbc.  

## 2022-02-12 NOTE — Assessment & Plan Note (Signed)
Daughter concerned about repeating herself.  Check routine labs and B12  ?

## 2022-02-12 NOTE — Assessment & Plan Note (Signed)
Follow lipid panel. Continue crestor.  

## 2022-02-13 ENCOUNTER — Other Ambulatory Visit: Payer: Self-pay

## 2022-02-13 DIAGNOSIS — R413 Other amnesia: Secondary | ICD-10-CM

## 2022-04-26 ENCOUNTER — Other Ambulatory Visit: Payer: Self-pay | Admitting: Internal Medicine

## 2022-04-26 DIAGNOSIS — F0390 Unspecified dementia without behavioral disturbance: Secondary | ICD-10-CM | POA: Diagnosis not present

## 2022-04-26 DIAGNOSIS — R634 Abnormal weight loss: Secondary | ICD-10-CM | POA: Diagnosis not present

## 2022-04-26 DIAGNOSIS — E559 Vitamin D deficiency, unspecified: Secondary | ICD-10-CM | POA: Diagnosis not present

## 2022-04-27 ENCOUNTER — Other Ambulatory Visit: Payer: Self-pay | Admitting: Neurology

## 2022-04-27 DIAGNOSIS — F0390 Unspecified dementia without behavioral disturbance: Secondary | ICD-10-CM

## 2022-05-12 ENCOUNTER — Ambulatory Visit
Admission: RE | Admit: 2022-05-12 | Discharge: 2022-05-12 | Disposition: A | Discharge status: Home / Self Care | Payer: Medicare Other | Source: Ambulatory Visit | Attending: Neurology | Admitting: Neurology

## 2022-05-12 DIAGNOSIS — F0390 Unspecified dementia without behavioral disturbance: Secondary | ICD-10-CM

## 2022-05-12 DIAGNOSIS — R410 Disorientation, unspecified: Secondary | ICD-10-CM | POA: Diagnosis not present

## 2022-06-12 ENCOUNTER — Ambulatory Visit (INDEPENDENT_AMBULATORY_CARE_PROVIDER_SITE_OTHER): Payer: Medicare Other | Admitting: Internal Medicine

## 2022-06-12 ENCOUNTER — Encounter: Payer: Self-pay | Admitting: Internal Medicine

## 2022-06-12 VITALS — BP 118/62 | HR 60 | Temp 98.0°F | Resp 15 | Ht 62.0 in | Wt 142.4 lb

## 2022-06-12 DIAGNOSIS — R351 Nocturia: Secondary | ICD-10-CM

## 2022-06-12 DIAGNOSIS — D649 Anemia, unspecified: Secondary | ICD-10-CM | POA: Diagnosis not present

## 2022-06-12 DIAGNOSIS — C8590 Non-Hodgkin lymphoma, unspecified, unspecified site: Secondary | ICD-10-CM

## 2022-06-12 DIAGNOSIS — K219 Gastro-esophageal reflux disease without esophagitis: Secondary | ICD-10-CM

## 2022-06-12 DIAGNOSIS — R2681 Unsteadiness on feet: Secondary | ICD-10-CM

## 2022-06-12 DIAGNOSIS — N2889 Other specified disorders of kidney and ureter: Secondary | ICD-10-CM | POA: Diagnosis not present

## 2022-06-12 DIAGNOSIS — R413 Other amnesia: Secondary | ICD-10-CM

## 2022-06-12 DIAGNOSIS — I10 Essential (primary) hypertension: Secondary | ICD-10-CM

## 2022-06-12 DIAGNOSIS — R739 Hyperglycemia, unspecified: Secondary | ICD-10-CM

## 2022-06-12 DIAGNOSIS — G8929 Other chronic pain: Secondary | ICD-10-CM

## 2022-06-12 DIAGNOSIS — E039 Hypothyroidism, unspecified: Secondary | ICD-10-CM

## 2022-06-12 DIAGNOSIS — M25562 Pain in left knee: Secondary | ICD-10-CM

## 2022-06-12 DIAGNOSIS — E7800 Pure hypercholesterolemia, unspecified: Secondary | ICD-10-CM

## 2022-06-12 DIAGNOSIS — M25561 Pain in right knee: Secondary | ICD-10-CM | POA: Diagnosis not present

## 2022-06-12 NOTE — Patient Instructions (Signed)
Vitamin D3 1000 units per day

## 2022-06-12 NOTE — Progress Notes (Signed)
Patient ID: Tasha Avila, female   DOB: [birth year], 88 y.o.   MRN: [id number]   Subjective:    Patient ID: Tasha Avila, female    DOB: 01/18/1928, 88 y.o.   MRN: [id number]   Patient here for a scheduled follow up.   Chief Complaint  Patient presents with   Hypertension   Hyperlipidemia   Anemia   .   HPI She is accompanied by her daughter.  History obtained from both of them.  Reports she is doing relatively well.  Increased pain - knees. Discussed.  Has been evaluated previously.  Notify if desires any further intervention.  Breathing stable.  No increased cough or congestion.  No increased acid reflux.  Uses a cane.  Nocturia - multiple times a night.  Discussed treatment options.  Memory changes.  Repeats things.     Past Medical History:  Diagnosis Date   Anemia    Arthritis    GERD (gastroesophageal reflux disease)    Hypercholesterolemia    Hypertension    Hypothyroidism    Past Surgical History:  Procedure Laterality Date   ABDOMINAL HYSTERECTOMY  1960s   bladder tack at the same time, ovaries not removed   LAPAROSCOPIC CHOLECYSTECTOMY  5/11   Family History  Problem Relation Age of Onset   Stroke Mother    Lymphoma Brother    Diabetes Brother    Diabetes Brother        x2   Thyroid disease Brother    Stroke Brother    Social History   Socioeconomic History   Marital status: Widowed    Spouse name: Not on file   Number of children: 2   Years of education: Not on file   Highest education level: Not on file  Occupational History   Not on file  Tobacco Use   Smoking status: Never   Smokeless tobacco: Never  Vaping Use   Vaping Use: Never used  Substance and Sexual Activity   Alcohol use: No    Alcohol/week: 0.0 standard drinks of alcohol   Drug use: No   Sexual activity: Not on file  Other Topics Concern   Not on file  Social History Narrative   Not on file   Social Determinants of Health   Financial Resource Strain: Low Risk   (08/18/2021)   Overall Financial Resource Strain (CARDIA)    Difficulty of Paying Living Expenses: Not hard at all  Food Insecurity: No Food Insecurity (08/18/2021)   Hunger Vital Sign    Worried About Running Out of Food in the Last Year: Never true    Ran Out of Food in the Last Year: Never true  Transportation Needs: No Transportation Needs (08/18/2021)   PRAPARE - Hydrologist (Medical): No    Lack of Transportation (Non-Medical): No  Physical Activity: Unknown (08/08/2018)   Exercise Vital Sign    Days of Exercise per Week: 0 days    Minutes of Exercise per Session: Not on file  Stress: No Stress Concern Present (08/18/2021)   [place]    Feeling of Stress : Not at all  Social Connections: Unknown (08/18/2021)   Social Connection and Isolation Panel [NHANES]    Frequency of Communication with Friends and Family: Not on file    Frequency of Social Gatherings with Friends and Family: More than three times a week    Attends Religious Services: Not on file  Active Member of Clubs or Organizations: Not on file    Attends Club or Organization Meetings: Not on file    Marital Status: Widowed     Review of Systems  Constitutional:  Negative for appetite change and unexpected weight change.  HENT:  Negative for congestion and sinus pressure.   Respiratory:  Negative for cough and chest tightness.        Breathing stable.   Cardiovascular:  Negative for chest pain, palpitations and leg swelling.  Gastrointestinal:  Negative for abdominal pain, diarrhea, nausea and vomiting.  Genitourinary:  Negative for difficulty urinating and dysuria.       Nocturia  Musculoskeletal:  Negative for myalgias.       Knee pain as outlined.   Skin:  Negative for color change and rash.  Neurological:  Negative for dizziness, light-headedness and headaches.  Psychiatric/Behavioral:  Negative for agitation and  dysphoric mood.        Objective:     BP 118/62 (BP Location: Left Arm, Patient Position: Sitting, Cuff Size: Small)   Pulse 60   Temp 98 F (36.7 C) (Temporal)   Resp 15   Ht _0  (1.575 m)   Wt 142 lb 6.4 oz (64.6 kg)   SpO2 98%   BMI 26.05 kg/m  Wt Readings from Last 3 Encounters:  06/12/22 142 lb 6.4 oz (64.6 kg)  02/10/22 134 lb (60.8 kg)  11/10/21 128 lb 6.4 oz (58.2 kg)    Physical Exam Vitals reviewed.  Constitutional:      General: She is not in acute distress.    Appearance: Normal appearance.  HENT:     Head: Normocephalic and atraumatic.     Right Ear: External ear normal.     Left Ear: External ear normal.  Eyes:     General: No scleral icterus.       Right eye: No discharge.        Left eye: No discharge.     Conjunctiva/sclera: Conjunctivae normal.  Neck:     Thyroid: No thyromegaly.  Cardiovascular:     Rate and Rhythm: Normal rate and regular rhythm.  Pulmonary:     Effort: No respiratory distress.     Breath sounds: Normal breath sounds. No wheezing.  Abdominal:     General: Bowel sounds are normal.     Palpations: Abdomen is soft.     Tenderness: There is no abdominal tenderness.  Musculoskeletal:        General: No swelling or tenderness.     Cervical back: Neck supple. No tenderness.  Lymphadenopathy:     Cervical: No cervical adenopathy.  Skin:    Findings: No erythema or rash.  Neurological:     Mental Status: She is alert.  Psychiatric:        Mood and Affect: Mood normal.        Behavior: Behavior normal.      Outpatient Encounter Medications as of 06/12/2022  Medication Sig   acetaminophen (TYLENOL) 500 MG tablet Take 500 mg by mouth every 6 (six) hours as needed.   Cyanocobalamin (B-12) 3000 MCG CAPS Take 3,000 mcg by mouth daily.   DULoxetine (CYMBALTA) 60 MG capsule Take 1 capsule by mouth once daily   Homeopathic Products ([employer] LEG CRAMPS PAIN RELIEF PO) Take by mouth as needed.   levothyroxine (SYNTHROID) 88 MCG  tablet Take 1 tablet (88 mcg total) by mouth daily.   lisinopril (ZESTRIL) 40 MG tablet Take 1 tablet by mouth once daily  pantoprazole (PROTONIX) 40 MG tablet Take 1 tablet by mouth once daily   Propylene Glycol (SYSTANE BALANCE OP) Apply 1 drop to eye as needed.   rosuvastatin (CRESTOR) 5 MG tablet TAKE 1 TABLET BY MOUTH ON MONDAY, WEDNESDAY AND FRIDAY   SALINE NASAL SPRAY NA Place into the nose as needed.   Vitamin D, Ergocalciferol, (DRISDOL) 1.25 MG (50000 UNIT) CAPS capsule Take 50,000 Units by mouth once a week.   No facility-administered encounter medications on file as of 06/12/2022.     Lab Results  Component Value Date   WBC 8.8 09/27/2021   HGB 14.2 09/27/2021   HCT 40.9 09/27/2021   PLT 288 09/27/2021   GLUCOSE 98 02/10/2022   CHOL 234 (H) 01/01/2020   TRIG 103.0 01/01/2020   HDL 71.70 01/01/2020   LDLDIRECT 123.3 10/24/2012   LDLCALC 141 (H) 01/01/2020   ALT 10 02/10/2022   AST 13 02/10/2022   NA 139 02/10/2022   K 4.7 02/10/2022   CL 103 02/10/2022   CREATININE 0.78 02/10/2022   BUN 16 02/10/2022   CO2 27 02/10/2022   TSH 1.26 02/10/2022   INR 1.1 05/24/2020   HGBA1C 5.9 08/09/2021    CT HEAD WO CONTRAST (5MM)  Result Date: 05/12/2022 CLINICAL DATA:  Increased with delirium, underlying dementia EXAM: CT HEAD WITHOUT CONTRAST TECHNIQUE: Contiguous axial images were obtained from the base of the skull through the vertex without intravenous contrast. RADIATION DOSE REDUCTION: This exam was performed according to the departmental dose-optimization program which includes automated exposure control, adjustment of the mA and/or kV according to patient size and/or use of iterative reconstruction technique. COMPARISON:  03/28/2018 FINDINGS: Brain: No evidence of acute infarction, hemorrhage, extra-axial collection, ventriculomegaly, or mass effect. Old right parietal lobe infarct with encephalomalacia. Generalized cerebral atrophy. Periventricular white matter low  attenuation likely secondary to microangiopathy. Vascular: Cerebrovascular atherosclerotic calcifications are noted. Skull: Negative for fracture or focal lesion. Sinuses/Orbits: Visualized portions of the orbits are unremarkable. Visualized portions of the paranasal sinuses are unremarkable. Visualized portions of the mastoid air cells are unremarkable. Other: None. IMPRESSION: 1. No acute intracranial findings. 2. Chronic microangiopathy. Electronically Signed   By: Kathreen Devoid M.D.   On: 05/12/2022 17:29       Assessment & Plan:   Problem List Items Addressed This Visit     Anemia    Follow cbc.       Essential (primary) hypertension    On lisinopril.  Follow pressures.  Follow metabolic panel.       Gastro-esophageal reflux disease without esophagitis    Continue protonix.        Hypercholesterolemia    Follow lipid panel. Continue crestor.       Hyperglycemia    Follow met b and a1c.       Hypertension - Primary   Hypothyroidism    On thyroid replacement.  Follow tsh.        Knee pain    Persistent.  Discussed referral to ortho for further evaluation and question of need for injection.  She declines.       Memory change    Repeats herself.  Memory change.  Discussed neurology input.        Nocturia    Increased urination at night.  Discussed treatment options.  Will hold.  Follow.       Pure hypercholesterolemia    Follow lipid panel.       Renal mass    Previously evaluated by urology and  nephrology.  Has declined f/u scan or further evaluation.  Did have CT in ER previously.  CT revealed - 1 cm right renal upper pole cyst.        Unsteady gait    Weakness.  Uses cane.  Follow.       Lymphoma ([place]) (Chronic)    Previously saw oncology.  Has declined further w/up and evaluation.         Einar Pheasant, MD

## 2022-06-17 ENCOUNTER — Encounter: Payer: Self-pay | Admitting: Internal Medicine

## 2022-06-17 NOTE — Assessment & Plan Note (Signed)
Repeats herself.  Memory change.  Discussed neurology input.

## 2022-06-17 NOTE — Assessment & Plan Note (Signed)
Follow met b and a1c.  

## 2022-06-17 NOTE — Assessment & Plan Note (Signed)
Weakness.  Uses cane.  Follow.

## 2022-06-17 NOTE — Assessment & Plan Note (Signed)
Continue protonix  

## 2022-06-17 NOTE — Assessment & Plan Note (Signed)
Follow cbc.  

## 2022-06-17 NOTE — Assessment & Plan Note (Signed)
Increased urination at night.  Discussed treatment options.  Will hold.  Follow.

## 2022-06-17 NOTE — Assessment & Plan Note (Signed)
Follow lipid panel. Continue crestor.  

## 2022-06-17 NOTE — Assessment & Plan Note (Signed)
Persistent.  Discussed referral to ortho for further evaluation and question of need for injection.  She declines.

## 2022-06-17 NOTE — Assessment & Plan Note (Signed)
On thyroid replacement.  Follow tsh.  

## 2022-06-17 NOTE — Assessment & Plan Note (Signed)
On lisinopril.  Follow pressures.  Follow metabolic panel.  

## 2022-06-17 NOTE — Assessment & Plan Note (Signed)
Previously saw oncology.  Has declined further w/up and evaluation.  

## 2022-06-17 NOTE — Assessment & Plan Note (Signed)
Follow lipid panel.   

## 2022-06-17 NOTE — Assessment & Plan Note (Signed)
Previously evaluated by urology and nephrology.  Has declined f/u scan or further evaluation.  Did have CT in ER previously.  CT revealed - 1 cm right renal upper pole cyst.   

## 2022-06-19 DIAGNOSIS — R1310 Dysphagia, unspecified: Secondary | ICD-10-CM | POA: Diagnosis not present

## 2022-06-19 DIAGNOSIS — K219 Gastro-esophageal reflux disease without esophagitis: Secondary | ICD-10-CM | POA: Diagnosis not present

## 2022-06-19 DIAGNOSIS — I10 Essential (primary) hypertension: Secondary | ICD-10-CM | POA: Diagnosis not present

## 2022-06-19 DIAGNOSIS — E7800 Pure hypercholesterolemia, unspecified: Secondary | ICD-10-CM | POA: Diagnosis not present

## 2022-07-28 DIAGNOSIS — E559 Vitamin D deficiency, unspecified: Secondary | ICD-10-CM | POA: Diagnosis not present

## 2022-07-28 DIAGNOSIS — R634 Abnormal weight loss: Secondary | ICD-10-CM | POA: Diagnosis not present

## 2022-07-28 DIAGNOSIS — F0390 Unspecified dementia without behavioral disturbance: Secondary | ICD-10-CM | POA: Diagnosis not present

## 2022-09-08 ENCOUNTER — Other Ambulatory Visit: Payer: Self-pay | Admitting: Internal Medicine

## 2022-10-13 ENCOUNTER — Encounter: Payer: Self-pay | Admitting: Internal Medicine

## 2022-10-13 ENCOUNTER — Ambulatory Visit (INDEPENDENT_AMBULATORY_CARE_PROVIDER_SITE_OTHER): Payer: Medicare Other | Admitting: Internal Medicine

## 2022-10-13 VITALS — BP 122/68 | HR 62 | Temp 97.9°F | Resp 18 | Ht 62.0 in | Wt 150.6 lb

## 2022-10-13 DIAGNOSIS — N2889 Other specified disorders of kidney and ureter: Secondary | ICD-10-CM

## 2022-10-13 DIAGNOSIS — C8590 Non-Hodgkin lymphoma, unspecified, unspecified site: Secondary | ICD-10-CM | POA: Diagnosis not present

## 2022-10-13 DIAGNOSIS — R739 Hyperglycemia, unspecified: Secondary | ICD-10-CM | POA: Diagnosis not present

## 2022-10-13 DIAGNOSIS — I10 Essential (primary) hypertension: Secondary | ICD-10-CM | POA: Diagnosis not present

## 2022-10-13 DIAGNOSIS — K219 Gastro-esophageal reflux disease without esophagitis: Secondary | ICD-10-CM

## 2022-10-13 DIAGNOSIS — E7800 Pure hypercholesterolemia, unspecified: Secondary | ICD-10-CM

## 2022-10-13 DIAGNOSIS — E039 Hypothyroidism, unspecified: Secondary | ICD-10-CM

## 2022-10-13 DIAGNOSIS — R413 Other amnesia: Secondary | ICD-10-CM

## 2022-10-13 DIAGNOSIS — D649 Anemia, unspecified: Secondary | ICD-10-CM | POA: Diagnosis not present

## 2022-10-13 MED ORDER — PANTOPRAZOLE SODIUM 40 MG PO TBEC: 40.0000 mg | DELAYED_RELEASE_TABLET | Freq: Every day | ORAL | 2 refills | Status: DC

## 2022-10-13 MED ORDER — DULOXETINE HCL 60 MG PO CPEP: 60.0000 mg | ORAL_CAPSULE | Freq: Every day | ORAL | 2 refills | Status: DC

## 2022-10-13 MED ORDER — LISINOPRIL 40 MG PO TABS: 40.0000 mg | ORAL_TABLET | Freq: Every day | ORAL | 2 refills | Status: DC

## 2022-10-13 NOTE — Progress Notes (Unsigned)
Patient ID: Tasha Avila, female   DOB: 10/28/1928, 88 y.o.   MRN: [id number]   Subjective:    Patient ID: Tasha Avila, female    DOB: 1928-02-19, 88 y.o.   MRN: [id number]   Patient here for  Chief Complaint  Patient presents with   Hypertension   Follow-up   .   HPI Here to follow up regarding her blood pressure, cholesterol and thyroid.  She is accompanied by her daughter.  History obtained from both of them.  Overall appears to be feeling better.  No chest pain reported.  No increased cough or congestion.  Bowels moving.  Eating well.  Knee pain - persistent.  Previous injections did not make a significant different.  Persistent memory issues.     Past Medical History:  Diagnosis Date   Anemia    Arthritis    GERD (gastroesophageal reflux disease)    Hypercholesterolemia    Hypertension    Hypothyroidism    Past Surgical History:  Procedure Laterality Date   ABDOMINAL HYSTERECTOMY  1960s   bladder tack at the same time, ovaries not removed   LAPAROSCOPIC CHOLECYSTECTOMY  5/11   Family History  Problem Relation Age of Onset   Stroke Mother    Lymphoma Brother    Diabetes Brother    Diabetes Brother        x2   Thyroid disease Brother    Stroke Brother    Social History   Socioeconomic History   Marital status: Widowed    Spouse name: Not on file   Number of children: 2   Years of education: Not on file   Highest education level: Not on file  Occupational History   Not on file  Tobacco Use   Smoking status: Never   Smokeless tobacco: Never  Vaping Use   Vaping Use: Never used  Substance and Sexual Activity   Alcohol use: No    Alcohol/week: 0.0 standard drinks of alcohol   Drug use: No   Sexual activity: Not on file  Other Topics Concern   Not on file  Social History Narrative   Not on file   Social Determinants of Health   Financial Resource Strain: Low Risk  (08/18/2021)   Overall Financial Resource Strain (CARDIA)    Difficulty  of Paying Living Expenses: Not hard at all  Food Insecurity: No Food Insecurity (08/18/2021)   Hunger Vital Sign    Worried About Running Out of Food in the Last Year: Never true    Ran Out of Food in the Last Year: Never true  Transportation Needs: No Transportation Needs (08/18/2021)   PRAPARE - Hydrologist (Medical): No    Lack of Transportation (Non-Medical): No  Physical Activity: Unknown (08/08/2018)   Exercise Vital Sign    Days of Exercise per Week: 0 days    Minutes of Exercise per Session: Not on file  Stress: No Stress Concern Present (08/18/2021)   [address]    Feeling of Stress : Not at all  Social Connections: Unknown (08/18/2021)   Social Connection and Isolation Panel [NHANES]    Frequency of Communication with Friends and Family: Not on file    Frequency of Social Gatherings with Friends and Family: More than three times a week    Attends Religious Services: Not on file    Active Member of Clubs or Organizations: Not on file    Attends Club  or Organization Meetings: Not on file    Marital Status: Widowed     Review of Systems  Constitutional:  Negative for unexpected weight change.  HENT:  Negative for congestion and sinus pressure.   Respiratory:  Negative for cough, chest tightness and shortness of breath.   Cardiovascular:  Negative for chest pain, palpitations and leg swelling.  Gastrointestinal:  Negative for abdominal pain, diarrhea, nausea and vomiting.  Genitourinary:  Negative for difficulty urinating and dysuria.  Musculoskeletal:  Negative for joint swelling and myalgias.  Skin:  Negative for color change and rash.  Neurological:  Negative for dizziness, light-headedness and headaches.  Psychiatric/Behavioral:  Negative for agitation and dysphoric mood.        Objective:     BP 122/68   Pulse 62   Temp 97.9 F (36.6 C) (Temporal)   Resp 18   Ht _0   (1.575 m)   Wt 150 lb 9.6 oz (68.3 kg)   SpO2 95%   BMI 27.55 kg/m  Wt Readings from Last 3 Encounters:  10/13/22 150 lb 9.6 oz (68.3 kg)  06/12/22 142 lb 6.4 oz (64.6 kg)  02/10/22 134 lb (60.8 kg)    Physical Exam Vitals reviewed.  Constitutional:      General: She is not in acute distress.    Appearance: Normal appearance.  HENT:     Head: Normocephalic and atraumatic.     Right Ear: External ear normal.     Left Ear: External ear normal.  Eyes:     General: No scleral icterus.       Right eye: No discharge.        Left eye: No discharge.     Conjunctiva/sclera: Conjunctivae normal.  Neck:     Thyroid: No thyromegaly.  Cardiovascular:     Rate and Rhythm: Normal rate and regular rhythm.  Pulmonary:     Effort: No respiratory distress.     Breath sounds: Normal breath sounds. No wheezing.  Abdominal:     General: Bowel sounds are normal.     Palpations: Abdomen is soft.     Tenderness: There is no abdominal tenderness.  Musculoskeletal:        General: No swelling or tenderness.     Cervical back: Neck supple. No tenderness.  Lymphadenopathy:     Cervical: No cervical adenopathy.  Skin:    Findings: No erythema or rash.  Neurological:     Mental Status: She is alert.  Psychiatric:        Mood and Affect: Mood normal.        Behavior: Behavior normal.      Outpatient Encounter Medications as of 10/13/2022  Medication Sig   acetaminophen (TYLENOL) 500 MG tablet Take 500 mg by mouth every 6 (six) hours as needed.   Cyanocobalamin (B-12) 3000 MCG CAPS Take 3,000 mcg by mouth daily.   Ergocalciferol (VITAMIN D2 PO) Take by mouth.   Homeopathic Products ([facility] LEG CRAMPS PAIN RELIEF PO) Take by mouth as needed.   levothyroxine (SYNTHROID) 88 MCG tablet Take 1 tablet by mouth once daily   Propylene Glycol (SYSTANE BALANCE OP) Apply 1 drop to eye as needed.   psyllium (METAMUCIL) 58.6 % packet Take 1 packet by mouth daily.   rosuvastatin (CRESTOR) 5 MG tablet TAKE  1 TABLET BY MOUTH ON MONDAY, WEDNESDAY AND FRIDAY   SALINE NASAL SPRAY NA Place into the nose as needed.   [DISCONTINUED] DULoxetine (CYMBALTA) 60 MG capsule Take 1 capsule by mouth  once daily   [DISCONTINUED] lisinopril (ZESTRIL) 40 MG tablet Take 1 tablet by mouth once daily   [DISCONTINUED] pantoprazole (PROTONIX) 40 MG tablet Take 1 tablet by mouth once daily   DULoxetine (CYMBALTA) 60 MG capsule Take 1 capsule (60 mg total) by mouth daily.   lisinopril (ZESTRIL) 40 MG tablet Take 1 tablet (40 mg total) by mouth daily.   pantoprazole (PROTONIX) 40 MG tablet Take 1 tablet (40 mg total) by mouth daily.   [DISCONTINUED] Vitamin D, Ergocalciferol, (DRISDOL) 1.25 MG (50000 UNIT) CAPS capsule Take 50,000 Units by mouth once a week.   No facility-administered encounter medications on file as of 10/13/2022.     Lab Results  Component Value Date   WBC 8.8 10/13/2022   HGB 13.0 10/13/2022   HCT 39.7 10/13/2022   PLT 336 10/13/2022   GLUCOSE 103 (H) 10/13/2022   CHOL 234 (H) 01/01/2020   TRIG 103.0 01/01/2020   HDL 71.70 01/01/2020   LDLDIRECT 123.3 10/24/2012   LDLCALC 141 (H) 01/01/2020   ALT 8 10/13/2022   AST 12 10/13/2022   NA 138 10/13/2022   K 4.5 10/13/2022   CL 101 10/13/2022   CREATININE 1.16 (H) 10/13/2022   BUN 17 10/13/2022   CO2 24 10/13/2022   TSH 0.569 10/13/2022   INR 1.1 05/24/2020   HGBA1C 5.9 08/09/2021    CT HEAD WO CONTRAST (5MM)  Result Date: 05/12/2022 CLINICAL DATA:  Increased with delirium, underlying dementia EXAM: CT HEAD WITHOUT CONTRAST TECHNIQUE: Contiguous axial images were obtained from the base of the skull through the vertex without intravenous contrast. RADIATION DOSE REDUCTION: This exam was performed according to the departmental dose-optimization program which includes automated exposure control, adjustment of the mA and/or kV according to patient size and/or use of iterative reconstruction technique. COMPARISON:  03/28/2018 FINDINGS: Brain: No  evidence of acute infarction, hemorrhage, extra-axial collection, ventriculomegaly, or mass effect. Old right parietal lobe infarct with encephalomalacia. Generalized cerebral atrophy. Periventricular white matter low attenuation likely secondary to microangiopathy. Vascular: Cerebrovascular atherosclerotic calcifications are noted. Skull: Negative for fracture or focal lesion. Sinuses/Orbits: Visualized portions of the orbits are unremarkable. Visualized portions of the paranasal sinuses are unremarkable. Visualized portions of the mastoid air cells are unremarkable. Other: None. IMPRESSION: 1. No acute intracranial findings. 2. Chronic microangiopathy. Electronically Signed   By: Kathreen Devoid M.D.   On: 05/12/2022 17:29       Assessment & Plan:   Problem List Items Addressed This Visit     Anemia    Follow cbc.       Relevant Orders   CBC w/Diff (Completed)   Essential (primary) hypertension    On lisinopril.  Follow pressures.  Follow metabolic panel.       Relevant Medications   lisinopril (ZESTRIL) 40 MG tablet   Gastro-esophageal reflux disease without esophagitis    Continue protonix.        Relevant Medications   psyllium (METAMUCIL) 58.6 % packet   pantoprazole (PROTONIX) 40 MG tablet   Hypercholesterolemia    Follow lipid panel. Continue crestor.       Relevant Medications   lisinopril (ZESTRIL) 40 MG tablet   Hyperglycemia    Follow met b and a1c.       Hypertension   Relevant Medications   lisinopril (ZESTRIL) 40 MG tablet   Other Relevant Orders   Hepatic function panel (Completed)   Basic Metabolic Panel (BMET) (Completed)   Hypothyroidism - Primary    On thyroid replacement.  Follow tsh.        Relevant Orders   TSH (Completed)   Lymphoma ([place]) (Chronic)    Previously saw oncology.  Has declined further w/up and evaluation.       Memory change    Saw neurology.  No changes made.  Follow.        Renal mass    Previously evaluated by urology and  nephrology.  Has declined f/u scan or further evaluation.  Did have CT in ER previously.  CT revealed - 1 cm right renal upper pole cyst.          Einar Pheasant, MD

## 2022-10-14 ENCOUNTER — Encounter: Payer: Self-pay | Admitting: Internal Medicine

## 2022-10-14 LAB — HEPATIC FUNCTION PANEL
ALT: 8 IU/L (ref 0–32)
AST: 12 IU/L (ref 0–40)
Albumin: 4.2 g/dL (ref 3.6–4.6)
Alkaline Phosphatase: 55 IU/L (ref 44–121)
Bilirubin Total: 0.2 mg/dL (ref 0.0–1.2)
Bilirubin, Direct: <0.1 mg/dL (ref 0.00–0.40)
Total Protein: 6.8 g/dL (ref 6.0–8.5)

## 2022-10-14 LAB — CBC WITH DIFFERENTIAL/PLATELET
Basophils Absolute: 0.1 10*3/uL (ref 0.0–0.2)
Basos: 1 %
EOS (ABSOLUTE): 0.2 10*3/uL (ref 0.0–0.4)
Eos: 2 %
Hematocrit: 39.7 % (ref 34.0–46.6)
Hemoglobin: 13 g/dL (ref 11.1–15.9)
Immature Grans (Abs): 0.1 10*3/uL (ref 0.0–0.1)
Immature Granulocytes: 1 %
Lymphocytes Absolute: 1.8 10*3/uL (ref 0.7–3.1)
Lymphs: 21 %
MCH: 30.9 pg (ref 26.6–33.0)
MCHC: 32.7 g/dL (ref 31.5–35.7)
MCV: 94 fL (ref 79–97)
Monocytes Absolute: 0.9 10*3/uL (ref 0.1–0.9)
Monocytes: 11 %
Neutrophils Absolute: 5.7 10*3/uL (ref 1.4–7.0)
Neutrophils: 64 %
Platelets: 336 10*3/uL (ref 150–450)
RBC: 4.21 x10E6/uL (ref 3.77–5.28)
RDW: 12.3 % (ref 11.7–15.4)
WBC: 8.8 10*3/uL (ref 3.4–10.8)

## 2022-10-14 LAB — BASIC METABOLIC PANEL
BUN/Creatinine Ratio: 15 (ref 12–28)
BUN: 17 mg/dL (ref 10–36)
CO2: 24 mmol/L (ref 20–29)
Calcium: 9.4 mg/dL (ref 8.7–10.3)
Chloride: 101 mmol/L (ref 96–106)
Creatinine, Ser: 1.16 mg/dL — ABNORMAL HIGH (ref 0.57–1.00)
Glucose: 103 mg/dL — ABNORMAL HIGH (ref 70–99)
Potassium: 4.5 mmol/L (ref 3.5–5.2)
Sodium: 138 mmol/L (ref 134–144)
eGFR: 44 mL/min/{1.73_m2} — ABNORMAL LOW (ref >59)

## 2022-10-14 LAB — TSH: TSH: 0.569 u[IU]/mL (ref 0.450–4.500)

## 2022-10-14 NOTE — Assessment & Plan Note (Signed)
Follow lipid panel. Continue crestor.

## 2022-10-14 NOTE — Assessment & Plan Note (Signed)
On lisinopril.  Follow pressures.  Follow metabolic panel.

## 2022-10-14 NOTE — Assessment & Plan Note (Signed)
Previously evaluated by urology and nephrology.  Has declined f/u scan or further evaluation.  Did have CT in ER previously.  CT revealed - 1 cm right renal upper pole cyst.

## 2022-10-14 NOTE — Assessment & Plan Note (Signed)
Follow cbc.  

## 2022-10-14 NOTE — Assessment & Plan Note (Signed)
Follow met b and a1c.  

## 2022-10-14 NOTE — Assessment & Plan Note (Signed)
On thyroid replacement.  Follow tsh.  

## 2022-10-14 NOTE — Assessment & Plan Note (Signed)
Saw neurology.  No changes made.  Follow.

## 2022-10-14 NOTE — Assessment & Plan Note (Signed)
Continue protonix  

## 2022-10-14 NOTE — Assessment & Plan Note (Signed)
Previously saw oncology.  Has declined further w/up and evaluation.

## 2022-10-16 ENCOUNTER — Other Ambulatory Visit: Payer: Self-pay

## 2022-10-16 DIAGNOSIS — R944 Abnormal results of kidney function studies: Secondary | ICD-10-CM

## 2022-11-02 ENCOUNTER — Other Ambulatory Visit: Payer: Medicare Other

## 2022-11-06 ENCOUNTER — Other Ambulatory Visit (INDEPENDENT_AMBULATORY_CARE_PROVIDER_SITE_OTHER): Payer: Medicare Other

## 2022-11-06 DIAGNOSIS — R944 Abnormal results of kidney function studies: Secondary | ICD-10-CM | POA: Diagnosis not present

## 2022-11-07 LAB — BASIC METABOLIC PANEL
BUN: 18 mg/dL (ref 6–23)
CO2: 28 mEq/L (ref 19–32)
Calcium: 8.9 mg/dL (ref 8.4–10.5)
Chloride: 102 mEq/L (ref 96–112)
Creatinine, Ser: 1.08 mg/dL (ref 0.40–1.20)
GFR: 44.71 mL/min — ABNORMAL LOW (ref >60.00)
Glucose, Bld: 104 mg/dL — ABNORMAL HIGH (ref 70–99)
Potassium: 4.7 mEq/L (ref 3.5–5.1)
Sodium: 139 mEq/L (ref 135–145)

## 2022-11-08 ENCOUNTER — Other Ambulatory Visit: Payer: Self-pay

## 2022-11-08 DIAGNOSIS — R944 Abnormal results of kidney function studies: Secondary | ICD-10-CM

## 2022-12-04 ENCOUNTER — Other Ambulatory Visit: Payer: Self-pay | Admitting: Internal Medicine

## 2022-12-06 ENCOUNTER — Other Ambulatory Visit (INDEPENDENT_AMBULATORY_CARE_PROVIDER_SITE_OTHER): Payer: Medicare Other

## 2022-12-06 DIAGNOSIS — R944 Abnormal results of kidney function studies: Secondary | ICD-10-CM | POA: Diagnosis not present

## 2022-12-07 LAB — BASIC METABOLIC PANEL
BUN: 18 mg/dL (ref 6–23)
CO2: 28 mEq/L (ref 19–32)
Calcium: 9.2 mg/dL (ref 8.4–10.5)
Chloride: 101 mEq/L (ref 96–112)
Creatinine, Ser: 1.11 mg/dL (ref 0.40–1.20)
GFR: 43.24 mL/min — ABNORMAL LOW (ref >60.00)
Glucose, Bld: 91 mg/dL (ref 70–99)
Potassium: 4.5 mEq/L (ref 3.5–5.1)
Sodium: 138 mEq/L (ref 135–145)

## 2022-12-08 ENCOUNTER — Telehealth: Payer: Self-pay | Admitting: Internal Medicine

## 2022-12-08 NOTE — Telephone Encounter (Signed)
Pt called returning Luna Fuse call back concerning her labs results.

## 2022-12-08 NOTE — Telephone Encounter (Signed)
See result note.  

## 2023-01-29 ENCOUNTER — Telehealth: Payer: Self-pay | Admitting: Internal Medicine

## 2023-01-29 DIAGNOSIS — N183 Chronic kidney disease, stage 3 unspecified: Secondary | ICD-10-CM

## 2023-01-29 NOTE — Telephone Encounter (Signed)
[name] called and wanted to know if office receive FL2 for patient. Also, patient needs a TB test.

## 2023-01-30 NOTE — Telephone Encounter (Signed)
Lm for pt daughter to cb  No paperwork that I can find. FL2 requires visit Has appt 3/11 - can do FL2 and TB then if daughter agreeable

## 2023-01-30 NOTE — Telephone Encounter (Signed)
Pt daughter called wanting an update on an FL2 form. I read the message to the daughter and she stated she need the paper filled out tomorrow. I told her that the provider will fill the paper out on the pt next appointment which is 3/11 and do the TB test. Pt daughter stated she already have the TB test scheduled and someone told her that Nicki Reaper was going to fill the paper work out tomorrow when she comes back. I reiterated to the daughter that the pt has not seen the provider since November 2023 and that she needs an appointment and they will fill out the paper work then. Pt daughter stated she would like for the provider to call her and tell her that

## 2023-01-30 NOTE — Telephone Encounter (Signed)
Need FL2 form.  Also, need to know from daughter, do they plan on placement into assisted living?  Just need a little more information.

## 2023-01-31 ENCOUNTER — Other Ambulatory Visit: Payer: Self-pay

## 2023-01-31 DIAGNOSIS — N183 Chronic kidney disease, stage 3 unspecified: Secondary | ICD-10-CM | POA: Insufficient documentation

## 2023-01-31 MED ORDER — VITAMIN B 12 500 MCG PO TABS: 500.0000 ug | ORAL_TABLET | Freq: Every day | ORAL | 0 refills | Status: DC

## 2023-01-31 MED ORDER — VITAMIN D3 125 MCG (5000 UT) PO CAPS: 5000.0000 [IU] | ORAL_CAPSULE | Freq: Every day | ORAL | 0 refills | Status: DC

## 2023-01-31 NOTE — Telephone Encounter (Signed)
Signed and placed in box.   

## 2023-01-31 NOTE — Telephone Encounter (Signed)
Pt daughter called in to check the status of the paperwork mentioned below(FL2). As per daughter, this its very important for her mom nursing home.

## 2023-01-31 NOTE — Telephone Encounter (Signed)
FL2 form has been completed for signature. Meds reviewed and added to [facility]- discussed with daughter. Need CKD stage 3 and dementia added to chart (I cannot add diagnosis). TB [facility] daughter. Nanine Means will administer step 2 TB skin.

## 2023-01-31 NOTE — Telephone Encounter (Signed)
Called daughter to let her know that I have received FL2 and I am working on completion. Will update her once it is sent.

## 2023-01-31 NOTE — Telephone Encounter (Signed)
FL2 received. Recommending memory care.

## 2023-01-31 NOTE — Telephone Encounter (Signed)
Form faxed. Denita at [facility] is aware.

## 2023-02-05 DIAGNOSIS — I10 Essential (primary) hypertension: Secondary | ICD-10-CM | POA: Diagnosis not present

## 2023-02-05 DIAGNOSIS — K219 Gastro-esophageal reflux disease without esophagitis: Secondary | ICD-10-CM | POA: Diagnosis not present

## 2023-02-05 DIAGNOSIS — E079 Disorder of thyroid, unspecified: Secondary | ICD-10-CM | POA: Diagnosis not present

## 2023-02-05 DIAGNOSIS — E7800 Pure hypercholesterolemia, unspecified: Secondary | ICD-10-CM | POA: Diagnosis not present

## 2023-02-05 DIAGNOSIS — C8590 Non-Hodgkin lymphoma, unspecified, unspecified site: Secondary | ICD-10-CM | POA: Diagnosis not present

## 2023-02-12 ENCOUNTER — Encounter: Payer: Self-pay | Admitting: Internal Medicine

## 2023-02-12 ENCOUNTER — Ambulatory Visit (INDEPENDENT_AMBULATORY_CARE_PROVIDER_SITE_OTHER): Payer: Medicare Other | Admitting: Internal Medicine

## 2023-02-12 VITALS — BP 138/78 | HR 71 | Temp 97.9°F | Resp 16 | Ht 62.0 in | Wt 153.8 lb

## 2023-02-12 DIAGNOSIS — K219 Gastro-esophageal reflux disease without esophagitis: Secondary | ICD-10-CM | POA: Diagnosis not present

## 2023-02-12 DIAGNOSIS — N2889 Other specified disorders of kidney and ureter: Secondary | ICD-10-CM

## 2023-02-12 DIAGNOSIS — N1832 Chronic kidney disease, stage 3b: Secondary | ICD-10-CM | POA: Diagnosis not present

## 2023-02-12 DIAGNOSIS — C8220 Follicular lymphoma grade III, unspecified, unspecified site: Secondary | ICD-10-CM | POA: Diagnosis not present

## 2023-02-12 DIAGNOSIS — E039 Hypothyroidism, unspecified: Secondary | ICD-10-CM | POA: Diagnosis not present

## 2023-02-12 DIAGNOSIS — M545 Low back pain, unspecified: Secondary | ICD-10-CM

## 2023-02-12 DIAGNOSIS — E7800 Pure hypercholesterolemia, unspecified: Secondary | ICD-10-CM

## 2023-02-12 DIAGNOSIS — I10 Essential (primary) hypertension: Secondary | ICD-10-CM | POA: Diagnosis not present

## 2023-02-12 DIAGNOSIS — F320 Major depressive disorder, single episode, mild: Secondary | ICD-10-CM

## 2023-02-12 DIAGNOSIS — C8590 Non-Hodgkin lymphoma, unspecified, unspecified site: Secondary | ICD-10-CM

## 2023-02-12 DIAGNOSIS — F0390 Unspecified dementia without behavioral disturbance: Secondary | ICD-10-CM

## 2023-02-12 DIAGNOSIS — G8929 Other chronic pain: Secondary | ICD-10-CM

## 2023-02-12 DIAGNOSIS — R739 Hyperglycemia, unspecified: Secondary | ICD-10-CM

## 2023-02-12 NOTE — Progress Notes (Signed)
Subjective:    Patient ID: Tasha Avila, female    DOB: 1928-11-12, 89 y.o.   MRN: [id number]  Patient here for  Chief Complaint  Patient presents with   Medical Management of Chronic Issues    HPI Here to follow up regarding her blood pressure, cholesterol and thyroid. She is accompanied by her daughter. History obtained from both of them. Recently moved into assisted living/memory - facility. Daughter reports she has been asking to go home.  Trying to get adjusted to the move.  Pt has some confusion with the move.  Discussed with Ms Morian today.  Reviewed depression screening. She denies any significant depression symptoms.  Denies SI.  She assures me she would not hurt herself.  Denies any chest pain.  Breathing stable.  Reports she is eating.  No vomiting.  No bowel issues reported.     Past Medical History:  Diagnosis Date   Anemia    Arthritis    GERD (gastroesophageal reflux disease)    Hypercholesterolemia    Hypertension    Hypothyroidism    Past Surgical History:  Procedure Laterality Date   ABDOMINAL HYSTERECTOMY  1960s   bladder tack at the same time, ovaries not removed   LAPAROSCOPIC CHOLECYSTECTOMY  5/11   Family History  Problem Relation Age of Onset   Stroke Mother    Lymphoma Brother    Diabetes Brother    Diabetes Brother        x2   Thyroid disease Brother    Stroke Brother    Social History   Socioeconomic History   Marital status: Widowed    Spouse name: Not on file   Number of children: 2   Years of education: Not on file   Highest education level: Not on file  Occupational History   Not on file  Tobacco Use   Smoking status: Never   Smokeless tobacco: Never  Vaping Use   Vaping Use: Never used  Substance and Sexual Activity   Alcohol use: No    Alcohol/week: 0.0 standard drinks of alcohol   Drug use: No   Sexual activity: Not on file  Other Topics Concern   Not on file  Social History Narrative   Not on file   Social  Determinants of Health   Financial Resource Strain: Low Risk  (08/18/2021)   Overall Financial Resource Strain (CARDIA)    Difficulty of Paying Living Expenses: Not hard at all  Food Insecurity: Unknown (02/18/2023)   Hunger Vital Sign    Worried About Running Out of Food in the Last Year: Never true    [place] in the Last Year: Patient declined  Transportation Needs: No Transportation Needs (02/18/2023)   PRAPARE - Hydrologist (Medical): No    Lack of Transportation (Non-Medical): No  Physical Activity: Unknown (08/08/2018)   Exercise Vital Sign    Days of Exercise per Week: 0 days    Minutes of Exercise per Session: Not on file  Stress: No Stress Concern Present (08/18/2021)   [place]    Feeling of Stress : Not at all  Social Connections: Unknown (08/18/2021)   Social Connection and Isolation Panel [NHANES]    Frequency of Communication with Friends and Family: Not on file    Frequency of Social Gatherings with Friends and Family: More than three times a week    Attends Religious Services: Not on file  Active Member of Clubs or Organizations: Not on file    Attends Club or Organization Meetings: Not on file    Marital Status: Widowed     Review of Systems  Constitutional:  Negative for appetite change and unexpected weight change.  HENT:  Negative for congestion and sinus pressure.   Respiratory:  Negative for cough and chest tightness.        Breathing stable.   Cardiovascular:  Negative for chest pain and palpitations.  Gastrointestinal:  Negative for abdominal pain, diarrhea, nausea and vomiting.  Genitourinary:  Negative for difficulty urinating and dysuria.  Musculoskeletal:  Negative for joint swelling and myalgias.  Skin:  Negative for color change and rash.  Neurological:  Negative for dizziness and headaches.  Psychiatric/Behavioral:  Negative for agitation.         Discussed move.         Objective:     BP 138/78   Pulse 71   Temp 97.9 F (36.6 C)   Resp 16   Ht 5\' 2"  (1.575 m)   Wt 153 lb 12.8 oz (69.8 kg)   SpO2 97%   BMI 28.13 kg/m  Wt Readings from Last 3 Encounters:  02/12/23 153 lb 12.8 oz (69.8 kg)  10/13/22 150 lb 9.6 oz (68.3 kg)  06/12/22 142 lb 6.4 oz (64.6 kg)    Physical Exam Vitals reviewed.  Constitutional:      General: She is not in acute distress.    Appearance: Normal appearance.  HENT:     Head: Normocephalic and atraumatic.     Right Ear: External ear normal.     Left Ear: External ear normal.  Eyes:     General: No scleral icterus.       Right eye: No discharge.        Left eye: No discharge.     Conjunctiva/sclera: Conjunctivae normal.  Neck:     Thyroid: No thyromegaly.  Cardiovascular:     Rate and Rhythm: Normal rate and regular rhythm.  Pulmonary:     Effort: No respiratory distress.     Breath sounds: Normal breath sounds. No wheezing.  Abdominal:     General: Bowel sounds are normal.     Palpations: Abdomen is soft.     Tenderness: There is no abdominal tenderness.  Musculoskeletal:        General: No swelling or tenderness.     Cervical back: Neck supple. No tenderness.  Lymphadenopathy:     Cervical: No cervical adenopathy.  Skin:    Findings: No erythema or rash.  Neurological:     Mental Status: She is alert.  Psychiatric:        Mood and Affect: Mood normal.        Behavior: Behavior normal.      No facility-administered encounter medications on file as of 02/12/2023.   Outpatient Encounter Medications as of 02/12/2023  Medication Sig   acetaminophen (TYLENOL) 500 MG tablet Take 500 mg by mouth every 6 (six) hours as needed.   Cholecalciferol (VITAMIN D3) 125 MCG (5000 UT) capsule Take 1 capsule (5,000 Units total) by mouth daily.   cyanocobalamin 500 MCG TABS Take 500 mcg by mouth daily.   DULoxetine (CYMBALTA) 60 MG capsule Take 1 capsule (60 mg total) by mouth daily.    Homeopathic Products ([facility] LEG CRAMPS PAIN RELIEF PO) Take by mouth as needed.   levothyroxine (SYNTHROID) 88 MCG tablet Take 1 tablet by mouth once daily   lisinopril (ZESTRIL) 40  MG tablet Take 1 tablet (40 mg total) by mouth daily.   pantoprazole (PROTONIX) 40 MG tablet Take 1 tablet (40 mg total) by mouth daily.   Propylene Glycol (SYSTANE BALANCE OP) Apply 1 drop to eye as needed.   psyllium (METAMUCIL) 58.6 % packet Take 1 packet by mouth daily.   rosuvastatin (CRESTOR) 5 MG tablet TAKE 1 TABLET BY MOUTH ON MONDAY, WEDNESDAY AND FRIDAY   SALINE NASAL SPRAY NA Place into the nose as needed.     Lab Results  Component Value Date   WBC 16.5 (H) 02/18/2023   HGB 10.2 (L) 02/18/2023   HCT 30.2 (L) 02/18/2023   PLT 316 02/18/2023   GLUCOSE 158 (H) 02/18/2023   CHOL 234 (H) 01/01/2020   TRIG 103.0 01/01/2020   HDL 71.70 01/01/2020   LDLDIRECT 123.3 10/24/2012   LDLCALC 141 (H) 01/01/2020   ALT 8 10/13/2022   AST 12 10/13/2022   NA 127 (L) 02/18/2023   K 4.7 02/18/2023   CL 95 (L) 02/18/2023   CREATININE 1.06 (H) 02/18/2023   BUN 14 02/18/2023   CO2 25 02/18/2023   TSH 0.569 10/13/2022   INR 1.1 02/18/2023   HGBA1C 5.9 08/09/2021    CT HEAD WO CONTRAST (5MM)  Result Date: 05/12/2022 CLINICAL DATA:  Increased with delirium, underlying dementia EXAM: CT HEAD WITHOUT CONTRAST TECHNIQUE: Contiguous axial images were obtained from the base of the skull through the vertex without intravenous contrast. RADIATION DOSE REDUCTION: This exam was performed according to the departmental dose-optimization program which includes automated exposure control, adjustment of the mA and/or kV according to patient size and/or use of iterative reconstruction technique. COMPARISON:  03/28/2018 FINDINGS: Brain: No evidence of acute infarction, hemorrhage, extra-axial collection, ventriculomegaly, or mass effect. Old right parietal lobe infarct with encephalomalacia. Generalized cerebral atrophy.  Periventricular white matter low attenuation likely secondary to microangiopathy. Vascular: Cerebrovascular atherosclerotic calcifications are noted. Skull: Negative for fracture or focal lesion. Sinuses/Orbits: Visualized portions of the orbits are unremarkable. Visualized portions of the paranasal sinuses are unremarkable. Visualized portions of the mastoid air cells are unremarkable. Other: None. IMPRESSION: 1. No acute intracranial findings. 2. Chronic microangiopathy. Electronically Signed   By: Kathreen Devoid M.D.   On: 05/12/2022 17:29       Assessment & Plan:  Chronic low back pain without sciatica, unspecified back pain laterality Assessment & Plan: On cymbalta.  Stable.  Desires no further w/up or evaluation.    Stage 3b chronic kidney disease ([name]) Assessment & Plan: Avoid antiinflammatories.  Follow metabolic panel. Continues on lisinopril.    Dementia, unspecified dementia severity, unspecified dementia type, unspecified whether behavioral, psychotic, or mood disturbance or anxiety [facility]) Assessment & Plan: Evaluated by neurology.     Essential hypertension Assessment & Plan: On lisinopril.  Follow pressures.  Follow metabolic panel.    Gastro-esophageal reflux disease without esophagitis Assessment & Plan: Continue protonix.     Hypercholesterolemia Assessment & Plan: Follow lipid panel. Continue crestor.    Hyperglycemia Assessment & Plan: Follow met b and a1c.    Hypothyroidism, unspecified type Assessment & Plan: On thyroid replacement.  Follow tsh.     Lymphoma, unspecified body region, unspecified lymphoma type [facility]) Assessment & Plan: Previously saw oncology.  Has declined further w/up and evaluation.    Follicular lymphoma grade III, unspecified body region [facility]) Assessment & Plan: Has been evaluated by oncology.  Desires no further w/up.      Renal mass Assessment & Plan: Previously evaluated by urology and  nephrology.  Has declined f/u scan  or further evaluation.  Did have CT in ER previously.  CT revealed - 1 cm right renal upper pole cyst.     Major depressive disorder, single episode, mild (HCC) Assessment & Plan: Continues on cymbalta.  Reviewed depression screening.  Denies any significant depressive symptoms.  Denies any suicidal ideations.  Assures me she would not hurt herself.     Stage 3b chronic kidney disease ([place]) Assessment & Plan: Avoid antiinflammatories.  Follow metabolic panel. Continues on lisinopril.    Disposition: Recently moved into assisted living/memory - facility. Will be seeing the MD at the facility.    Einar Pheasant, MD

## 2023-02-18 ENCOUNTER — Encounter (HOSPITAL_BASED_OUTPATIENT_CLINIC_OR_DEPARTMENT_OTHER): Payer: Self-pay | Admitting: Emergency Medicine

## 2023-02-18 ENCOUNTER — Other Ambulatory Visit: Payer: Self-pay

## 2023-02-18 ENCOUNTER — Other Ambulatory Visit (HOSPITAL_COMMUNITY): Payer: Medicare Other

## 2023-02-18 ENCOUNTER — Inpatient Hospital Stay (HOSPITAL_COMMUNITY): Payer: Medicare Other

## 2023-02-18 ENCOUNTER — Emergency Department (HOSPITAL_BASED_OUTPATIENT_CLINIC_OR_DEPARTMENT_OTHER): Payer: Medicare Other

## 2023-02-18 ENCOUNTER — Encounter: Payer: Self-pay | Admitting: Internal Medicine

## 2023-02-18 ENCOUNTER — Inpatient Hospital Stay (HOSPITAL_BASED_OUTPATIENT_CLINIC_OR_DEPARTMENT_OTHER)
Admission: EM | Admit: 2023-02-18 | Discharge: 2023-02-21 | DRG: 481 | Disposition: A | Discharge status: Skilled Nursing Facility | Payer: Medicare Other | Source: Skilled Nursing Facility | Attending: Family Medicine | Admitting: Family Medicine

## 2023-02-18 DIAGNOSIS — W19XXXA Unspecified fall, initial encounter: Secondary | ICD-10-CM | POA: Diagnosis not present

## 2023-02-18 DIAGNOSIS — Y92129 Unspecified place in nursing home as the place of occurrence of the external cause: Secondary | ICD-10-CM

## 2023-02-18 DIAGNOSIS — N182 Chronic kidney disease, stage 2 (mild): Secondary | ICD-10-CM

## 2023-02-18 DIAGNOSIS — Z833 Family history of diabetes mellitus: Secondary | ICD-10-CM | POA: Diagnosis not present

## 2023-02-18 DIAGNOSIS — S72141D Displaced intertrochanteric fracture of right femur, subsequent encounter for closed fracture with routine healing: Secondary | ICD-10-CM | POA: Diagnosis not present

## 2023-02-18 DIAGNOSIS — N1831 Chronic kidney disease, stage 3a: Secondary | ICD-10-CM | POA: Diagnosis not present

## 2023-02-18 DIAGNOSIS — S72141A Displaced intertrochanteric fracture of right femur, initial encounter for closed fracture: Secondary | ICD-10-CM | POA: Diagnosis not present

## 2023-02-18 DIAGNOSIS — R1312 Dysphagia, oropharyngeal phase: Secondary | ICD-10-CM | POA: Diagnosis not present

## 2023-02-18 DIAGNOSIS — E878 Other disorders of electrolyte and fluid balance, not elsewhere classified: Secondary | ICD-10-CM | POA: Diagnosis present

## 2023-02-18 DIAGNOSIS — H9190 Unspecified hearing loss, unspecified ear: Secondary | ICD-10-CM | POA: Diagnosis present

## 2023-02-18 DIAGNOSIS — F03A0 Unspecified dementia, mild, without behavioral disturbance, psychotic disturbance, mood disturbance, and anxiety: Secondary | ICD-10-CM | POA: Diagnosis not present

## 2023-02-18 DIAGNOSIS — D72829 Elevated white blood cell count, unspecified: Secondary | ICD-10-CM

## 2023-02-18 DIAGNOSIS — M898X9 Other specified disorders of bone, unspecified site: Secondary | ICD-10-CM | POA: Diagnosis present

## 2023-02-18 DIAGNOSIS — D649 Anemia, unspecified: Secondary | ICD-10-CM | POA: Insufficient documentation

## 2023-02-18 DIAGNOSIS — C8590 Non-Hodgkin lymphoma, unspecified, unspecified site: Secondary | ICD-10-CM | POA: Diagnosis present

## 2023-02-18 DIAGNOSIS — E871 Hypo-osmolality and hyponatremia: Secondary | ICD-10-CM | POA: Diagnosis present

## 2023-02-18 DIAGNOSIS — R2681 Unsteadiness on feet: Secondary | ICD-10-CM | POA: Diagnosis not present

## 2023-02-18 DIAGNOSIS — E7800 Pure hypercholesterolemia, unspecified: Secondary | ICD-10-CM | POA: Diagnosis present

## 2023-02-18 DIAGNOSIS — E039 Hypothyroidism, unspecified: Secondary | ICD-10-CM | POA: Diagnosis present

## 2023-02-18 DIAGNOSIS — Z807 Family history of other malignant neoplasms of lymphoid, hematopoietic and related tissues: Secondary | ICD-10-CM | POA: Diagnosis not present

## 2023-02-18 DIAGNOSIS — Z7401 Bed confinement status: Secondary | ICD-10-CM | POA: Diagnosis not present

## 2023-02-18 DIAGNOSIS — Z79899 Other long term (current) drug therapy: Secondary | ICD-10-CM | POA: Diagnosis not present

## 2023-02-18 DIAGNOSIS — E785 Hyperlipidemia, unspecified: Secondary | ICD-10-CM | POA: Diagnosis not present

## 2023-02-18 DIAGNOSIS — Z66 Do not resuscitate: Secondary | ICD-10-CM | POA: Diagnosis present

## 2023-02-18 DIAGNOSIS — Z823 Family history of stroke: Secondary | ICD-10-CM

## 2023-02-18 DIAGNOSIS — F0393 Unspecified dementia, unspecified severity, with mood disturbance: Secondary | ICD-10-CM | POA: Diagnosis present

## 2023-02-18 DIAGNOSIS — I10 Essential (primary) hypertension: Secondary | ICD-10-CM | POA: Insufficient documentation

## 2023-02-18 DIAGNOSIS — F320 Major depressive disorder, single episode, mild: Secondary | ICD-10-CM | POA: Insufficient documentation

## 2023-02-18 DIAGNOSIS — F32A Depression, unspecified: Secondary | ICD-10-CM | POA: Insufficient documentation

## 2023-02-18 DIAGNOSIS — Z8349 Family history of other endocrine, nutritional and metabolic diseases: Secondary | ICD-10-CM | POA: Diagnosis not present

## 2023-02-18 DIAGNOSIS — R41841 Cognitive communication deficit: Secondary | ICD-10-CM | POA: Diagnosis not present

## 2023-02-18 DIAGNOSIS — Z9181 History of falling: Secondary | ICD-10-CM | POA: Diagnosis not present

## 2023-02-18 DIAGNOSIS — K219 Gastro-esophageal reflux disease without esophagitis: Secondary | ICD-10-CM | POA: Diagnosis present

## 2023-02-18 DIAGNOSIS — Z79890 Hormone replacement therapy (postmenopausal): Secondary | ICD-10-CM | POA: Diagnosis not present

## 2023-02-18 DIAGNOSIS — I129 Hypertensive chronic kidney disease with stage 1 through stage 4 chronic kidney disease, or unspecified chronic kidney disease: Secondary | ICD-10-CM | POA: Diagnosis present

## 2023-02-18 DIAGNOSIS — S80919A Unspecified superficial injury of unspecified knee, initial encounter: Secondary | ICD-10-CM | POA: Diagnosis not present

## 2023-02-18 DIAGNOSIS — D62 Acute posthemorrhagic anemia: Secondary | ICD-10-CM | POA: Diagnosis not present

## 2023-02-18 DIAGNOSIS — S72001A Fracture of unspecified part of neck of right femur, initial encounter for closed fracture: Secondary | ICD-10-CM | POA: Diagnosis not present

## 2023-02-18 DIAGNOSIS — M6281 Muscle weakness (generalized): Secondary | ICD-10-CM | POA: Diagnosis not present

## 2023-02-18 DIAGNOSIS — I69828 Other speech and language deficits following other cerebrovascular disease: Secondary | ICD-10-CM | POA: Diagnosis not present

## 2023-02-18 DIAGNOSIS — R531 Weakness: Secondary | ICD-10-CM | POA: Diagnosis not present

## 2023-02-18 DIAGNOSIS — W1830XA Fall on same level, unspecified, initial encounter: Secondary | ICD-10-CM | POA: Diagnosis present

## 2023-02-18 DIAGNOSIS — I69891 Dysphagia following other cerebrovascular disease: Secondary | ICD-10-CM | POA: Diagnosis not present

## 2023-02-18 DIAGNOSIS — F0390 Unspecified dementia without behavioral disturbance: Secondary | ICD-10-CM

## 2023-02-18 LAB — CBC WITH DIFFERENTIAL/PLATELET
Abs Immature Granulocytes: 0.07 10*3/uL (ref 0.00–0.07)
Basophils Absolute: 0 10*3/uL (ref 0.0–0.1)
Basophils Relative: 0 %
Eosinophils Absolute: 0 10*3/uL (ref 0.0–0.5)
Eosinophils Relative: 0 %
HCT: 33.9 % — ABNORMAL LOW (ref 36.0–46.0)
Hemoglobin: 11.5 g/dL — ABNORMAL LOW (ref 12.0–15.0)
Immature Granulocytes: 0 %
Lymphocytes Relative: 4 %
Lymphs Abs: 0.7 10*3/uL (ref 0.7–4.0)
MCH: 30.8 pg (ref 26.0–34.0)
MCHC: 33.9 g/dL (ref 30.0–36.0)
MCV: 90.9 fL (ref 80.0–100.0)
Monocytes Absolute: 1.8 10*3/uL — ABNORMAL HIGH (ref 0.1–1.0)
Monocytes Relative: 11 %
Neutro Abs: 14.4 10*3/uL — ABNORMAL HIGH (ref 1.7–7.7)
Neutrophils Relative %: 85 %
Platelets: 343 10*3/uL (ref 150–400)
RBC: 3.73 MIL/uL — ABNORMAL LOW (ref 3.87–5.11)
RDW: 12.8 % (ref 11.5–15.5)
WBC: 17 10*3/uL — ABNORMAL HIGH (ref 4.0–10.5)
nRBC: 0 % (ref 0.0–0.2)

## 2023-02-18 LAB — BASIC METABOLIC PANEL
Anion gap: 6 (ref 5–15)
Anion gap: 7 (ref 5–15)
Anion gap: 10 (ref 5–15)
BUN: 14 mg/dL (ref 8–23)
BUN: 14 mg/dL (ref 8–23)
BUN: 15 mg/dL (ref 8–23)
CO2: 25 mmol/L (ref 22–32)
CO2: 25 mmol/L (ref 22–32)
CO2: 22 mmol/L (ref 22–32)
Calcium: 8.5 mg/dL — ABNORMAL LOW (ref 8.9–10.3)
Calcium: 8.5 mg/dL — ABNORMAL LOW (ref 8.9–10.3)
Calcium: 8.2 mg/dL — ABNORMAL LOW (ref 8.9–10.3)
Chloride: 93 mmol/L — ABNORMAL LOW (ref 98–111)
Chloride: 95 mmol/L — ABNORMAL LOW (ref 98–111)
Chloride: 96 mmol/L — ABNORMAL LOW (ref 98–111)
Creatinine, Ser: 0.78 mg/dL (ref 0.44–1.00)
Creatinine, Ser: 1.06 mg/dL — ABNORMAL HIGH (ref 0.44–1.00)
Creatinine, Ser: 1.16 mg/dL — ABNORMAL HIGH (ref 0.44–1.00)
GFR, Estimated: >60 mL/min (ref >60)
GFR, Estimated: 49 mL/min — ABNORMAL LOW (ref >60)
GFR, Estimated: 44 mL/min — ABNORMAL LOW (ref >60)
Glucose, Bld: 174 mg/dL — ABNORMAL HIGH (ref 70–99)
Glucose, Bld: 158 mg/dL — ABNORMAL HIGH (ref 70–99)
Glucose, Bld: 128 mg/dL — ABNORMAL HIGH (ref 70–99)
Potassium: 4.6 mmol/L (ref 3.5–5.1)
Potassium: 4.7 mmol/L (ref 3.5–5.1)
Potassium: 4.9 mmol/L (ref 3.5–5.1)
Sodium: 124 mmol/L — ABNORMAL LOW (ref 135–145)
Sodium: 127 mmol/L — ABNORMAL LOW (ref 135–145)
Sodium: 128 mmol/L — ABNORMAL LOW (ref 135–145)

## 2023-02-18 LAB — OSMOLALITY: Osmolality: 273 mOsm/kg — ABNORMAL LOW (ref 275–295)

## 2023-02-18 LAB — CBC
HCT: 30.2 % — ABNORMAL LOW (ref 36.0–46.0)
Hemoglobin: 10.2 g/dL — ABNORMAL LOW (ref 12.0–15.0)
MCH: 31 pg (ref 26.0–34.0)
MCHC: 33.8 g/dL (ref 30.0–36.0)
MCV: 91.8 fL (ref 80.0–100.0)
Platelets: 316 10*3/uL (ref 150–400)
RBC: 3.29 MIL/uL — ABNORMAL LOW (ref 3.87–5.11)
RDW: 12.9 % (ref 11.5–15.5)
WBC: 16.5 10*3/uL — ABNORMAL HIGH (ref 4.0–10.5)
nRBC: 0 % (ref 0.0–0.2)

## 2023-02-18 LAB — PROTIME-INR
INR: 1.1 (ref 0.8–1.2)
Prothrombin Time: 13.7 seconds (ref 11.4–15.2)

## 2023-02-18 LAB — SODIUM: Sodium: 129 mmol/L — ABNORMAL LOW (ref 135–145)

## 2023-02-18 MED ORDER — MORPHINE SULFATE (PF) 4 MG/ML IV SOLN
4.0000 mg | Freq: Once | INTRAVENOUS | Status: AC
  Administered 2023-02-18: 4 mg via INTRAVENOUS
  Filled 2023-02-18: qty 1

## 2023-02-18 MED ORDER — LEVOTHYROXINE SODIUM 88 MCG PO TABS
88.0000 ug | ORAL_TABLET | Freq: Every day | ORAL | Status: DC
  Administered 2023-02-20 – 2023-02-21 (×2): 88 ug via ORAL
  Filled 2023-02-18 (×2): qty 1

## 2023-02-18 MED ORDER — SODIUM CHLORIDE 0.9 % IV SOLN: INTRAVENOUS | Status: DC

## 2023-02-18 MED ORDER — ROSUVASTATIN CALCIUM 5 MG PO TABS
5.0000 mg | ORAL_TABLET | ORAL | Status: DC
  Administered 2023-02-21: 5 mg via ORAL
  Filled 2023-02-18 (×2): qty 1

## 2023-02-18 MED ORDER — ONDANSETRON HCL 4 MG/2ML IJ SOLN
4.0000 mg | Freq: Once | INTRAMUSCULAR | Status: AC
  Administered 2023-02-18: 4 mg via INTRAVENOUS
  Filled 2023-02-18: qty 2

## 2023-02-18 MED ORDER — PANTOPRAZOLE SODIUM 40 MG PO TBEC
40.0000 mg | DELAYED_RELEASE_TABLET | Freq: Every day | ORAL | Status: DC
  Administered 2023-02-20 – 2023-02-21 (×2): 40 mg via ORAL
  Filled 2023-02-18 (×2): qty 1

## 2023-02-18 MED ORDER — NALOXONE HCL 0.4 MG/ML IJ SOLN: 0.4000 mg | INTRAMUSCULAR | Status: DC | PRN

## 2023-02-18 MED ORDER — MORPHINE SULFATE (PF) 2 MG/ML IV SOLN: 1.0000 mg | INTRAVENOUS | Status: DC | PRN

## 2023-02-18 MED ORDER — CYANOCOBALAMIN 500 MCG PO TABS
500.0000 ug | ORAL_TABLET | Freq: Every day | ORAL | Status: DC
  Administered 2023-02-20 – 2023-02-21 (×2): 500 ug via ORAL
  Filled 2023-02-18 (×2): qty 1

## 2023-02-18 MED ORDER — ACETAMINOPHEN 325 MG PO TABS
650.0000 mg | ORAL_TABLET | Freq: Four times a day (QID) | ORAL | Status: DC | PRN
  Administered 2023-02-18: 650 mg via ORAL
  Filled 2023-02-18: qty 2

## 2023-02-18 NOTE — ED Notes (Signed)
Pts daughter, [name], at bedside. Daughter reports she will take pts 2 hearing aids home to be charged.

## 2023-02-18 NOTE — Assessment & Plan Note (Signed)
On thyroid replacement.  Follow tsh.  

## 2023-02-18 NOTE — ED Notes (Signed)
[facility] called for transport. 

## 2023-02-18 NOTE — Assessment & Plan Note (Signed)
Continues on cymbalta.  Reviewed depression screening.  Denies any significant depressive symptoms.  Denies any suicidal ideations.  Assures me she would not hurt herself.

## 2023-02-18 NOTE — Assessment & Plan Note (Signed)
On lisinopril.  Follow pressures.  Follow metabolic panel.  

## 2023-02-18 NOTE — ED Notes (Signed)
Pt transported to imaging.

## 2023-02-18 NOTE — Assessment & Plan Note (Signed)
Follow met b and a1c.  

## 2023-02-18 NOTE — ED Triage Notes (Signed)
Pt BIB [facility] from [facility].    Pt with mechanical fall after standing, "knees buckled."  Pt denies head injury, denies LOC, denies blood thinners  C/o BL knee pain.   Alert to self only, reported as pts baseline.

## 2023-02-18 NOTE — Assessment & Plan Note (Signed)
On cymbalta.  Stable.  Desires no further w/up or evaluation.  

## 2023-02-18 NOTE — Progress Notes (Signed)
Plan of Care Note for accepted transfer   Patient: Tasha Avila MRN: [id number]   Doyle: 02/18/2023  Facility requesting transfer: [facility] Requesting Provider: Veryl Speak, MD   Reason for transfer: Right hip fracture Facility course: Patient is a 89 year old female presenting with complaints of a fall.  She has a history of non-Hodgkin's lymphoma, dementia, hypertension, and GERD.  Patient is a resident of an extended care facility where the fall occurred.  She was apparently found on the floor.  Staff attempted to assist her back to bed, however she could not stand due to pain in her left hip.  Patient is extremely hard of hearing and adds little additional history and has no recollection of the fall, but does deny to me she is experiencing any neck pain, headache, or other injury.   When she came to the ER BP was 133/59 with temperature of 97.5 and otherwise normal vital signs.  Labs revealed hyponatremia of 124 and hypochloremia of 93 with blood glucose of 174 and calcium 8.5.  CBC showed leukocytosis of 17 with neutrophilia and anemia.  INR was 1.1 and PT 13.7.  The patient was given 4 mg of IV morphine sulfate and 4 mg of IV Zofran and normal saline at 125 mL/h.  Contact was made with Dr. Mable Fill who recommended admission to [place] of care: The patient is accepted for admission to [place]  unit, at [facility]..   The patient will be under the care and responsibility of the ER physician until arrival to [facility].  Author: Christel Mormon, MD 02/18/2023  Check [email or web address] for on-call coverage.  Nursing staff, Please call [place] number on Amion as soon as patient's arrival, so appropriate admitting provider can evaluate the pt.

## 2023-02-18 NOTE — ED Notes (Signed)
Attempted report to 5N x 2. No answer.

## 2023-02-18 NOTE — Assessment & Plan Note (Signed)
Avoid antiinflammatories.  Follow metabolic panel. Continues on lisinopril.

## 2023-02-18 NOTE — Care Plan (Signed)
Came by patient's room this morning patient out of room for additional scanning and testing.  Spoke with patient's daughter and discussed care plan.  We will not be able to perform her surgery today, but I have discussed patient with Dr. Marcelino Scot who is planning to take care of her tomorrow in the orthopedic trauma room.  From orthopedic standpoint okay for patient to have a diet today, n.p.o. at midnight for OR tomorrow.  Hip fracture block NWB RLE NPO midnight for OR 02/19/2023

## 2023-02-18 NOTE — Assessment & Plan Note (Signed)
Has been evaluated by oncology.  Desires no further w/up.    ?

## 2023-02-18 NOTE — ED Provider Notes (Signed)
[place] EMERGENCY DEPARTMENT AT [place] Provider Note   CSN: [id number] Arrival date & time: 02/18/23  0021     History  Chief Complaint  Patient presents with   [facility] Tasha Avila is a 89 y.o. female.  Patient is a 89 year old female presenting with complaints of a fall.  She has a history of non-Hodgkin's lymphoma, dementia, hypertension, and GERD.  Patient is a resident of an extended care facility where the fall occurred.  She was apparently found on the floor.  Staff attempted to assist her back to bed, however she could not stand due to pain in her left hip.  Patient is extremely hard of hearing and adds little additional history and has no recollection of the fall, but does deny to me she is experiencing any neck pain, headache, or other injury.  The history is provided by the patient.       Home Medications Prior to Admission medications   Medication Sig Start Date End Date Taking? Authorizing Provider  acetaminophen (TYLENOL) 500 MG tablet Take 500 mg by mouth every 6 (six) hours as needed.    [provider]  Cholecalciferol (VITAMIN D3) 125 MCG (5000 UT) capsule Take 1 capsule (5,000 Units total) by mouth daily. 01/31/23   Einar Pheasant, MD  cyanocobalamin 500 MCG TABS Take 500 mcg by mouth daily. 01/31/23   Einar Pheasant, MD  DULoxetine (CYMBALTA) 60 MG capsule Take 1 capsule (60 mg total) by mouth daily. 10/13/22   Einar Pheasant, MD  Homeopathic Products ([employer] LEG CRAMPS PAIN RELIEF PO) Take by mouth as needed.    [provider]  levothyroxine (SYNTHROID) 88 MCG tablet Take 1 tablet by mouth once daily 12/05/22   Einar Pheasant, MD  lisinopril (ZESTRIL) 40 MG tablet Take 1 tablet (40 mg total) by mouth daily. 10/13/22   Einar Pheasant, MD  pantoprazole (PROTONIX) 40 MG tablet Take 1 tablet (40 mg total) by mouth daily. 10/13/22   Einar Pheasant, MD  Propylene Glycol (SYSTANE BALANCE OP) Apply 1 drop to eye as needed.     [provider]  psyllium (METAMUCIL) 58.6 % packet Take 1 packet by mouth daily.    [provider]  rosuvastatin (CRESTOR) 5 MG tablet TAKE 1 TABLET BY MOUTH ON Adelfa Koh, [facility] AND FRIDAY 12/05/22   Einar Pheasant, MD  SALINE NASAL SPRAY NA Place into the nose as needed.    [provider]      Allergies    No known drug allergy    Review of Systems   Review of Systems  All other systems reviewed and are negative.   Physical Exam Updated Vital Signs BP (!) 133/59   Pulse 70   Temp (!) 97.5 F (36.4 C) (Axillary)   Resp 19   SpO2 95%  Physical Exam Vitals and nursing note reviewed.  Constitutional:      General: She is not in acute distress.    Appearance: She is well-developed. She is not diaphoretic.  HENT:     Head: Normocephalic and atraumatic.  Eyes:     Extraocular Movements: Extraocular movements intact.     Pupils: Pupils are equal, round, and reactive to light.  Neck:     Comments: There is no cervical spine tenderness.  Patient has painless range of motion in all directions. Cardiovascular:     Rate and Rhythm: Normal rate and regular rhythm.     Heart sounds: No murmur heard.  No friction rub. No gallop.  Pulmonary:     Effort: Pulmonary effort is normal. No respiratory distress.     Breath sounds: Normal breath sounds. No wheezing.  Abdominal:     General: Bowel sounds are normal. There is no distension.     Palpations: Abdomen is soft.     Tenderness: There is no abdominal tenderness.  Musculoskeletal:        General: Normal range of motion.     Cervical back: Normal range of motion and neck supple.     Comments: There is tenderness in the right hip and pain noted with range of motion.  The right leg does appear somewhat shorter than the left.  DP pulses are palpable and motor and sensation are intact throughout the entire foot.  Skin:    General: Skin is warm and dry.  Neurological:     General: No focal deficit present.      Mental Status: She is alert and oriented to person, place, and time.     Cranial Nerves: No cranial nerve deficit.     Motor: No weakness.     ED Results / Procedures / Treatments   Labs (all labs ordered are listed, but only abnormal results are displayed) Labs Reviewed - No data to display  EKG None  Radiology No results found.  Procedures Procedures    Medications Ordered in ED Medications - No data to display  ED Course/ Medical Decision Making/ A&P  Patient is a 89 year old female with history of dementia and other past medical history as per HPI.  She is brought by EMS for evaluation of a fall.  She was attempting to ambulate at her extended care facility and apparently fell.  She was found by staff on the floor.  Patient arrives here complaining of right hip pain.  She has no other complaints.  Physical examination does reveal tenderness with palpation and movement of the right hip.  There are no signs of head trauma and she has painless range of motion of her neck in all directions.  There are no other apparent injuries.  Laboratory studies obtained including CBC, metabolic panel, and coags, all of which are unremarkable.  X-ray obtained of the right hip showing a comminuted intertrochanteric fracture.  Chest x-ray clear.  The above fracture was discussed with Dr. Mable Fill from orthopedics.  He has recommended admission to [facility] for repair, most likely on Monday.  I have also spoken with Dr. Sidney Ace who agrees to admit.  Patient given morphine for pain here in the ED and maintenance fluids initiated.    Final Clinical Impression(s) / ED Diagnoses Final diagnoses:  None    Rx / DC Orders ED Discharge Orders     None         Veryl Speak, MD 02/18/23 [phone]

## 2023-02-18 NOTE — Assessment & Plan Note (Signed)
Continue protonix  

## 2023-02-18 NOTE — Progress Notes (Signed)
Initial Nutrition Assessment RD working remotely.   DOCUMENTATION CODES:   Not applicable  INTERVENTION:  - complete NFPE when feasible. - diet advancement as medically feasible.  NUTRITION DIAGNOSIS:   Increased nutrient needs related to hip fracture as evidenced by estimated needs  GOAL:   Patient will meet greater than or equal to 90% of their needs  MONITOR:   Diet advancement, Labs, Weight trends, Skin  REASON FOR ASSESSMENT:   Consult Hip fracture protocol  ASSESSMENT:   89 year old female with medical history of non-Hodgkin's lymphoma, dementia, HTN, HLD, stage 2-3 CKD, GERD, and frequent UTIs. Patient is a resident on memory care unit at [facility] (move in date of 02/02/23) where she experienced a fall. She was found on the floor by staff who attempted to assist her back to bed, however she could not stand due to pain in her left hip. Patient is extremely hard of hearing.  Patient with known hx of dementia with documentation indicating she is a/o x1. She has been NPO since admission.   She has not been assessed by a [place] RD at any time in the past.  Weight on 02/12/23 was 153 lb which is stable since 10/13/22 and up from other weight recordings in 2023 and 2022. No information documented in the edema section of flow sheet.  Orthopedic team has been consulted.   Labs reviewed; Na: 124 mmol/l, Cl: 93 mmol/l, Ca: 8.5 mg/dl. Medications reviewed. IVF; NS @ 125 ml/hr.    NUTRITION - FOCUSED PHYSICAL EXAM:  RD working remotely.  Diet Order:   Diet Order             Diet NPO time specified  Diet effective now                   EDUCATION NEEDS:   No education needs have been identified at this time  Skin:  Skin Assessment: Reviewed RN Assessment  Last BM:  PTA/unknown  Height:   Ht Readings from Last 1 Encounters:  02/12/23 5\' 2"  (1.575 m)    Weight:   Wt Readings from Last 1 Encounters:  02/12/23 69.8 kg    Ideal Body Weight:   50 kg  Estimated Nutritional Needs:  Kcal:  1600-1800 kcal Protein:  75-85 grams Fluid:  >/= 1.7 L/day    Jarome Matin, MS, RD, LDN, CNSC Clinical Dietitian PRN/Relief staff On-call/weekend pager # available in [facility]

## 2023-02-18 NOTE — Assessment & Plan Note (Signed)
Evaluated by neurology.   

## 2023-02-18 NOTE — ED Notes (Signed)
Attempted to call report to 5N x 1. No answer.

## 2023-02-18 NOTE — Progress Notes (Signed)
Orthopedic Tech Progress Note Patient Details:  Tasha Avila [birth year] [id number]  Patient ID: Tasha Avila, female   DOB: 1928-10-19, 89 y.o.   MRN: [id number] Patient doesn't meet criteria for ohf. Must be under 70 to have an ohf.  Karolee Stamps 02/18/2023, 6:51 AM

## 2023-02-18 NOTE — Assessment & Plan Note (Signed)
Previously saw oncology.  Has declined further w/up and evaluation.  

## 2023-02-18 NOTE — Assessment & Plan Note (Signed)
Follow lipid panel. Continue crestor.  

## 2023-02-18 NOTE — H&P (Signed)
History and Physical    Tasha Avila [id number] DOB: February 26, 1928 DOA: 02/18/2023  PCP: Einar Pheasant, MD  Patient coming from: [facility] ED  Chief Complaint: Fall  HPI: Tasha Avila is a 89 y.o. female with medical history significant of dementia, hypertension, hyperlipidemia, GERD, hypothyroidism, depression, anemia, history of non-Hodgkin's lymphoma, CKD stage II-IIIa presented to [facility] ED from [facility] living for evaluation of right hip pain after a mechanical fall.  In the ED, vital signs stable.  Labs significant for WBC 17.0, hemoglobin 11.5 (previously 12-14 range), MCV 90.9, sodium 124, chloride 93, glucose 174, calcium 8.5.  X-ray of right hip/pelvis showing angulated displaced right femoral intertrochanteric fracture.  Chest x-ray showing no active disease.  EKG showing sinus rhythm and no acute ischemic changes. ED physician consulted Dr. Mable Fill with orthopedics who recommended admission at [facility].  Patient received morphine and Zofran in the ED.  Patient resting in the bed comfortably, smiling.  She is extremely hard of hearing and is not able to give any history.  Daughter [name] at bedside states patient has dementia and recently moved into the memory care unit at [facility] on March 1st.  She was informed that the patient had a fall at her facility tonight and was found on the floor.  When staff tried to help her stand up, she could not put any weight on her legs.  She complained of pain in her right leg.  Unknown whether she hit her head or lost consciousness.  Daughter states patient otherwise has been in her usual state of health and recently had regular checkups by her PCP and cardiologist.  She is not on anticoagulation or antiplatelet agents.  Daughter is not aware of patient having any vomiting or diarrhea.  She does report patient having history of frequent UTIs.  Review of Systems:  Review of Systems  Reason unable to perform  ROS: Dementia and patient extremely hard of hearing.  All other systems reviewed and are negative.   Past Medical History:  Diagnosis Date   Anemia    Arthritis    GERD (gastroesophageal reflux disease)    Hypercholesterolemia    Hypertension    Hypothyroidism     Past Surgical History:  Procedure Laterality Date   ABDOMINAL HYSTERECTOMY  1960s   bladder tack at the same time, ovaries not removed   LAPAROSCOPIC CHOLECYSTECTOMY  5/11     reports that she has never smoked. She has never used smokeless tobacco. She reports that she does not drink alcohol and does not use drugs.  Allergies  Allergen Reactions   No Known Drug Allergy     Family History  Problem Relation Age of Onset   Stroke Mother    Lymphoma Brother    Diabetes Brother    Diabetes Brother        x2   Thyroid disease Brother    Stroke Brother     Prior to Admission medications   Medication Sig Start Date End Date Taking? Authorizing Provider  acetaminophen (TYLENOL) 500 MG tablet Take 500 mg by mouth every 6 (six) hours as needed.    [provider]  Cholecalciferol (VITAMIN D3) 125 MCG (5000 UT) capsule Take 1 capsule (5,000 Units total) by mouth daily. 01/31/23   Einar Pheasant, MD  cyanocobalamin 500 MCG TABS Take 500 mcg by mouth daily. 01/31/23   Einar Pheasant, MD  DULoxetine (CYMBALTA) 60 MG capsule Take 1 capsule (60 mg total) by mouth daily. 10/13/22  Einar Pheasant, MD  Homeopathic Products ([employer] LEG CRAMPS PAIN RELIEF PO) Take by mouth as needed.    [provider]  levothyroxine (SYNTHROID) 88 MCG tablet Take 1 tablet by mouth once daily 12/05/22   Einar Pheasant, MD  lisinopril (ZESTRIL) 40 MG tablet Take 1 tablet (40 mg total) by mouth daily. 10/13/22   Einar Pheasant, MD  pantoprazole (PROTONIX) 40 MG tablet Take 1 tablet (40 mg total) by mouth daily. 10/13/22   Einar Pheasant, MD  Propylene Glycol (SYSTANE BALANCE OP) Apply 1 drop to eye as needed.    [provider]  psyllium (METAMUCIL) 58.6 % packet Take 1 packet by mouth daily.    [provider]  rosuvastatin (CRESTOR) 5 MG tablet TAKE 1 TABLET BY MOUTH ON Adelfa Koh, [facility] AND FRIDAY 12/05/22   Einar Pheasant, MD  SALINE NASAL SPRAY NA Place into the nose as needed.    [provider]    Physical Exam: Vitals:   02/18/23 0027 02/18/23 0035 02/18/23 0400 02/18/23 0443  BP:  (!) 133/59 (!) 123/52 121/60  Pulse:  70 71 83  Resp:  19 19 17   Temp:  (!) 97.5 F (36.4 C)  97.7 F (36.5 C)  TempSrc:  Axillary    SpO2: 99% 95% 93% (!) 83%    Physical Exam Vitals reviewed.  Constitutional:      General: She is not in acute distress. HENT:     Head: Normocephalic and atraumatic.  Eyes:     Extraocular Movements: Extraocular movements intact.  Cardiovascular:     Rate and Rhythm: Normal rate and regular rhythm.     Pulses: Normal pulses.  Pulmonary:     Effort: Pulmonary effort is normal. No respiratory distress.     Breath sounds: Normal breath sounds. No wheezing or rales.  Abdominal:     General: Bowel sounds are normal. There is no distension.     Palpations: Abdomen is soft.     Tenderness: There is no abdominal tenderness.  Musculoskeletal:     Cervical back: Normal range of motion.     Right lower leg: No edema.     Left lower leg: No edema.     Comments: Right lower extremity shortened and externally rotated.  Dorsalis pedis pulse palpable.  Skin:    General: Skin is warm and dry.  Neurological:     Mental Status: She is alert. Mental status is at baseline.     Labs on Admission: I have personally reviewed following labs and imaging studies  CBC: Recent Labs  Lab 02/18/23 0137  WBC 17.0*  NEUTROABS 14.4*  HGB 11.5*  HCT 33.9*  MCV 90.9  PLT [id number]   Basic Metabolic Panel: Recent Labs  Lab 02/18/23 0137  NA 124*  K 4.6  CL 93*  CO2 25  GLUCOSE 174*  BUN 14  CREATININE 0.78  CALCIUM 8.5*   GFR: Estimated Creatinine Clearance:  41.1 mL/min (by C-G formula based on SCr of 0.78 mg/dL). Liver Function Tests: No results for input(s): "AST", "ALT", "ALKPHOS", "BILITOT", "PROT", "ALBUMIN" in the last 168 hours. No results for input(s): "LIPASE", "AMYLASE" in the last 168 hours. No results for input(s): "AMMONIA" in the last 168 hours. Coagulation Profile: Recent Labs  Lab 02/18/23 0137  INR 1.1   Cardiac Enzymes: No results for input(s): "CKTOTAL", "CKMB", "CKMBINDEX", "TROPONINI" in the last 168 hours. BNP (last 3 results) No results for input(s): "PROBNP" in the last 8760 hours. HbA1C: No results for input(s): "  HGBA1C" in the last 72 hours. CBG: No results for input(s): "GLUCAP" in the last 168 hours. Lipid Profile: No results for input(s): "CHOL", "HDL", "LDLCALC", "TRIG", "CHOLHDL", "LDLDIRECT" in the last 72 hours. Thyroid Function Tests: No results for input(s): "TSH", "T4TOTAL", "FREET4", "T3FREE", "THYROIDAB" in the last 72 hours. Anemia Panel: No results for input(s): "VITAMINB12", "FOLATE", "FERRITIN", "TIBC", "IRON", "RETICCTPCT" in the last 72 hours. Urine analysis:    Component Value Date/Time   COLORURINE YELLOW 11/10/2021 1441   APPEARANCEUR CLEAR 11/10/2021 1441   APPEARANCEUR Hazy 02/22/2014 1039   LABSPEC <=1.005 (A) 11/10/2021 1441   LABSPEC 1.009 02/22/2014 1039   PHURINE 7.0 11/10/2021 1441   GLUCOSEU NEGATIVE 11/10/2021 1441   HGBUR NEGATIVE 11/10/2021 1441   BILIRUBINUR NEGATIVE 11/10/2021 1441   BILIRUBINUR Negative 02/22/2014 1039   KETONESUR NEGATIVE 11/10/2021 1441   PROTEINUR NEGATIVE 09/26/2019 1522   UROBILINOGEN 0.2 11/10/2021 1441   NITRITE NEGATIVE 11/10/2021 1441   LEUKOCYTESUR TRACE (A) 11/10/2021 1441   LEUKOCYTESUR 3+ 02/22/2014 1039    Radiological Exams on Admission: DG Chest 1 View  Result Date: 02/18/2023 CLINICAL DATA:  Right hip fracture.  Fall. EXAM: CHEST  1 VIEW COMPARISON:  09/27/2021 FINDINGS: Heart and mediastinal contours are within normal limits.  No focal opacities or effusions. No acute bony abnormality. IMPRESSION: No active disease. Electronically Signed   By: Rolm Baptise M.D.   On: 02/18/2023 01:38   DG HIP UNILAT WITH PELVIS 2-3 VIEWS RIGHT  Result Date: 02/18/2023 CLINICAL DATA:  Fall out of bed, right hip pain EXAM: DG HIP (WITH OR WITHOUT PELVIS) 2-3V RIGHT COMPARISON:  None Available. FINDINGS: There is a comminuted right femoral intertrochanteric fracture with displaced fracture fragments and varus angulation. No subluxation or dislocation. IMPRESSION: Angulated displaced right femoral intertrochanteric fracture. Electronically Signed   By: Rolm Baptise M.D.   On: 02/18/2023 01:37    Assessment and Plan  Angulated displaced right femoral intertrochanteric fracture Secondary to a mechanical fall. ED physician consulted Dr. Mable Fill with orthopedics.  Keep n.p.o. at this time, nonweightbearing, and continue pain management. EKG without acute ischemic changes and patient is not endorsing chest pain.  She was recently seen by cardiology at [facility] system on 02/05/2023 and no documented history of CAD. Chest x-ray showing no active disease.  Hyponatremia Possibly medication induced as she is on Cymbalta. ?Poor po intake.  Not on diuretics. Sodium 124.  IV fluid hydration and monitor labs every 6 hours.  Check serum osmolarity.  Goal rate of correction 4 to 6 mEq in 24 hours.  Hold Cymbalta at this time.  Leukocytosis WBC 17.0, no fever or signs of sepsis.  Chest x-ray not suggestive of pneumonia.  Check UA to rule out UTI.  Repeat CBC.  Unwitnessed fall Unknown whether patient hit her head or lost consciousness.  Awake and alert at this time and appears comfortable.  Neuroexam challenging as she has dementia and also extremely hard of hearing.  CT head ordered.  Normocytic anemia Hemoglobin 11.5, previously in the 12-14 range.  Type and screen, continue to monitor CBC.  CKD stage II-IIIa Renal function stable,  continue to monitor.  Dementia Delirium precautions.  History of Non-Hodgkin's lymphoma Outpatient oncology follow-up.  Depression Hold Cymbalta given hyponatremia.  Hypertension: Currently normotensive. Hyperlipidemia Hypothyroidism: TSH normal on 10/13/2022. GERD Pharmacy med rec pending.  DVT prophylaxis: SCDs Code Status: DNR/DNI (discussed with the patient's daughter) Family Communication: Daughter at bedside. Consults called: Orthopedics Level of care: Med-Surg Admission status: It  is my clinical opinion that admission to INPATIENT is reasonable and necessary because of the expectation that this patient will require hospital care that crosses at least 2 midnights to treat this condition based on the medical complexity of the problems presented.  Given the aforementioned information, the predictability of an adverse outcome is felt to be significant.   Shela Leff MD [facility]  If 7PM-7AM, please contact night-coverage [email or web address]  02/18/2023, 5:12 AM

## 2023-02-18 NOTE — Progress Notes (Signed)
PROGRESS NOTE    Tasha Avila  [id number] DOB: March 25, 1928 DOA: 02/18/2023 PCP: Einar Pheasant, MD   Brief Narrative:  HPI: Tasha Avila is a 89 y.o. female with medical history significant of dementia, hypertension, hyperlipidemia, GERD, hypothyroidism, depression, anemia, history of non-Hodgkin's lymphoma, CKD stage II-IIIa presented to [place] ED from [facility] living for evaluation of right hip pain after a mechanical fall.  In the ED, vital signs stable.  Labs significant for WBC 17.0, hemoglobin 11.5 (previously 12-14 range), MCV 90.9, sodium 124, chloride 93, glucose 174, calcium 8.5.  X-ray of right hip/pelvis showing angulated displaced right femoral intertrochanteric fracture.  Chest x-ray showing no active disease.  EKG showing sinus rhythm and no acute ischemic changes. ED physician consulted Dr. Mable Fill with orthopedics who recommended admission at [facility].  Patient received morphine and Zofran in the ED.   Patient resting in the bed comfortably, smiling.  She is extremely hard of hearing and is not able to give any history.  Daughter [name] at bedside states patient has dementia and recently moved into the memory care unit at [facility] on March 1st.  She was informed that the patient had a fall at her facility tonight and was found on the floor.  When staff tried to help her stand up, she could not put any weight on her legs.  She complained of pain in her right leg.  Unknown whether she hit her head or lost consciousness.  Daughter states patient otherwise has been in her usual state of health and recently had regular checkups by her PCP and cardiologist.  She is not on anticoagulation or antiplatelet agents.  Daughter is not aware of patient having any vomiting or diarrhea.  She does report patient having history of frequent UTIs.  Assessment & Plan:   Principal Problem:   Closed right hip fracture (HCC) Active Problems:   Essential  hypertension   Non Hodgkin's lymphoma (HCC)   Depression   Normocytic anemia   Hyponatremia   Leukocytosis   Fall   Chronic kidney disease, stage II (mild)   Dementia ([place])  Angulated displaced right femoral intertrochanteric fracture Secondary to a mechanical fall.  Patient appears comfortable.  Dr. Lavina Hamman from orthopedic is consulted.  Plan is for surgery today.    Acute hyponatremia Possibly medication induced as she is on Cymbalta. ?Poor po intake.  Not on diuretics. Sodium 124 upon presentation but improved to 127 today after receiving some IV fluids.  Continue to hold Cymbalta and continue IV fluids and monitor sodium every 8 hours..  IV fluid hydration and monitor labs every 6 hours.     Leukocytosis WBC 17.0, no fever or signs of sepsis.  Chest x-ray not suggestive of pneumonia.  UA pending.     Unwitnessed fall Unknown whether patient hit her head or lost consciousness.  Awake and alert at this time and appears comfortable.  She is fully alert and moving all extremities spontaneously.  CT head shows chronic right occipital infarct but nothing acute.   Normocytic anemia Hemoglobin 11.5, previously in the 12-14 range.  Type and screen, continue to monitor CBC.   CKD stage II-IIIa Renal function stable, continue to monitor.   Dementia Delirium precautions.   History of Non-Hodgkin's lymphoma Outpatient oncology follow-up.   Depression Hold Cymbalta given hyponatremia.   Hypertension: Blood pressure slightly low.  Continue to hold lisinopril.  Hyperlipidemia: Will resume Crestor starting tomorrow.  Hypothyroidism: TSH normal on 10/13/2022.  Will resume  Synthroid.  GERD: Will resume PPI after surgery.   DVT prophylaxis: SCDs Start: 02/18/23 [id number]   Code Status: DNR  Family Communication:  None present at bedside.   Status is: Inpatient Remains inpatient appropriate because: Patient is scheduled for orthopedic surgery.   Estimated body mass index is 28.13 kg/m  as calculated from the following:   Height as of 02/12/23: 5\' 2"  (1.575 m).   Weight as of 02/12/23: 69.8 kg.    Nutritional Assessment: There is no height or weight on file to calculate BMI.. Seen by dietician.  I agree with the assessment and plan as outlined below: Nutrition Status: Nutrition Problem: Increased nutrient needs Etiology: hip fracture Signs/Symptoms: estimated needs Interventions: Refer to RD note for recommendations  . Skin Assessment: I have examined the patient's skin and I agree with the wound assessment as performed by the wound care RN as outlined below:    Consultants:  Orthopedics  Procedures:  As above  Antimicrobials:  Anti-infectives (From admission, onward)    None         Subjective: Patient seen and examined.  Patient appears to be alert but not oriented, with history of dementia, this is her baseline.  She is also very hard of hearing.  Objective: Vitals:   02/18/23 0400 02/18/23 0443 02/18/23 0510 02/18/23 0739  BP: (!) 123/52 121/60  97/67  Pulse: 71 83  79  Resp: 19 17  17   Temp:  97.7 F (36.5 C)    TempSrc:      SpO2: 93% (!) 83% 92%    No intake or output data in the 24 hours ending 02/18/23 1022 There were no vitals filed for this visit.  Examination:  General exam: Appears calm and comfortable  Respiratory system: Clear to auscultation. Respiratory effort normal. Cardiovascular system: S1 & S2 heard, RRR. No JVD, murmurs, rubs, gallops or clicks. No pedal edema. Gastrointestinal system: Abdomen is nondistended, soft and nontender. No organomegaly or masses felt. Normal bowel sounds heard. Central nervous system: Alert but not oriented. No focal neurological deficits.   Data Reviewed: I have personally reviewed following labs and imaging studies  CBC: Recent Labs  Lab 02/18/23 0137 02/18/23 0637  WBC 17.0* 16.5*  NEUTROABS 14.4*  --   HGB 11.5* 10.2*  HCT 33.9* 30.2*  MCV 90.9 91.8  PLT 343 [id number]   Basic  Metabolic Panel: Recent Labs  Lab 02/18/23 0137 02/18/23 0637  NA 124* 127*  K 4.6 4.7  CL 93* 95*  CO2 25 25  GLUCOSE 174* 158*  BUN 14 14  CREATININE 0.78 1.06*  CALCIUM 8.5* 8.5*   GFR: Estimated Creatinine Clearance: 31 mL/min (A) (by C-G formula based on SCr of 1.06 mg/dL (H)). Liver Function Tests: No results for input(s): "AST", "ALT", "ALKPHOS", "BILITOT", "PROT", "ALBUMIN" in the last 168 hours. No results for input(s): "LIPASE", "AMYLASE" in the last 168 hours. No results for input(s): "AMMONIA" in the last 168 hours. Coagulation Profile: Recent Labs  Lab 02/18/23 0137  INR 1.1   Cardiac Enzymes: No results for input(s): "CKTOTAL", "CKMB", "CKMBINDEX", "TROPONINI" in the last 168 hours. BNP (last 3 results) No results for input(s): "PROBNP" in the last 8760 hours. HbA1C: No results for input(s): "HGBA1C" in the last 72 hours. CBG: No results for input(s): "GLUCAP" in the last 168 hours. Lipid Profile: No results for input(s): "CHOL", "HDL", "LDLCALC", "TRIG", "CHOLHDL", "LDLDIRECT" in the last 72 hours. Thyroid Function Tests: No results for input(s): "TSH", "T4TOTAL", "FREET4", "T3FREE", "THYROIDAB" in  the last 72 hours. Anemia Panel: No results for input(s): "VITAMINB12", "FOLATE", "FERRITIN", "TIBC", "IRON", "RETICCTPCT" in the last 72 hours. Sepsis Labs: No results for input(s): "PROCALCITON", "LATICACIDVEN" in the last 168 hours.  No results found for this or any previous visit (from the past 240 hour(s)).   Radiology Studies: CT HEAD WO CONTRAST (5MM)  Result Date: 02/18/2023 CLINICAL DATA:  Follow-up after fall EXAM: CT HEAD WITHOUT CONTRAST TECHNIQUE: Contiguous axial images were obtained from the base of the skull through the vertex without intravenous contrast. RADIATION DOSE REDUCTION: This exam was performed according to the departmental dose-optimization program which includes automated exposure control, adjustment of the mA and/or kV according  to patient size and/or use of iterative reconstruction technique. COMPARISON:  05/12/2022 FINDINGS: Brain: No evidence of acute infarction, hemorrhage, hydrocephalus, extra-axial collection or mass lesion/mass effect. Chronic right occipital infarct. Generalized cerebral volume loss. Vascular: No hyperdense vessel or unexpected calcification. Skull: Normal. Negative for fracture or focal lesion. Sinuses/Orbits: No acute finding. IMPRESSION: 1. No evidence of injury. 2. Chronic right occipital infarct. Electronically Signed   By: Jorje Guild M.D.   On: 02/18/2023 08:24   DG Chest 1 View  Result Date: 02/18/2023 CLINICAL DATA:  Right hip fracture.  Fall. EXAM: CHEST  1 VIEW COMPARISON:  09/27/2021 FINDINGS: Heart and mediastinal contours are within normal limits. No focal opacities or effusions. No acute bony abnormality. IMPRESSION: No active disease. Electronically Signed   By: Rolm Baptise M.D.   On: 02/18/2023 01:38   DG HIP UNILAT WITH PELVIS 2-3 VIEWS RIGHT  Result Date: 02/18/2023 CLINICAL DATA:  Fall out of bed, right hip pain EXAM: DG HIP (WITH OR WITHOUT PELVIS) 2-3V RIGHT COMPARISON:  None Available. FINDINGS: There is a comminuted right femoral intertrochanteric fracture with displaced fracture fragments and varus angulation. No subluxation or dislocation. IMPRESSION: Angulated displaced right femoral intertrochanteric fracture. Electronically Signed   By: Rolm Baptise M.D.   On: 02/18/2023 01:37    Scheduled Meds: Continuous Infusions:  sodium chloride 125 mL/hr at 02/18/23 0701     LOS: 0 days   Darliss Cheney, MD [facility]  02/18/2023, 10:22 AM   *Please note that this is a verbal dictation therefore any spelling or grammatical errors are due to the "[place]" system interpretation.  Please page via [place] and do not message via secure chat for urgent patient care matters. Secure chat can be used for non urgent patient care matters.  How to contact the [facility]  Attending or Consulting provider [place] or covering provider during after hours [place], for this patient?  Check the care team in [facility] and look for a) attending/consulting TRH provider listed and b) the [facility] team listed. Page or secure chat [phone]. Log into [email or web address] and use [place]'s universal password to access. If you do not have the password, please contact the hospital operator. Locate the [facility] provider you are looking for under [facility] and page to a number that you can be directly reached. If you still have difficulty reaching the provider, please page the [facility] (Director on Call) for the Hospitalists listed on amion for assistance.

## 2023-02-18 NOTE — Assessment & Plan Note (Signed)
Previously evaluated by urology and nephrology.  Has declined f/u scan or further evaluation.  Did have CT in ER previously.  CT revealed - 1 cm right renal upper pole cyst.   ?

## 2023-02-19 ENCOUNTER — Other Ambulatory Visit: Payer: Self-pay

## 2023-02-19 ENCOUNTER — Inpatient Hospital Stay (HOSPITAL_COMMUNITY): Payer: Medicare Other

## 2023-02-19 ENCOUNTER — Inpatient Hospital Stay (HOSPITAL_COMMUNITY): Payer: Medicare Other | Admitting: Anesthesiology

## 2023-02-19 ENCOUNTER — Encounter (HOSPITAL_COMMUNITY)
Admission: EM | Disposition: A | Discharge status: Skilled Nursing Facility | Payer: Self-pay | Source: Skilled Nursing Facility | Attending: Family Medicine

## 2023-02-19 DIAGNOSIS — S72141A Displaced intertrochanteric fracture of right femur, initial encounter for closed fracture: Secondary | ICD-10-CM | POA: Diagnosis not present

## 2023-02-19 DIAGNOSIS — S72001A Fracture of unspecified part of neck of right femur, initial encounter for closed fracture: Secondary | ICD-10-CM | POA: Diagnosis not present

## 2023-02-19 HISTORY — PX: FEMUR IM NAIL: SHX1597

## 2023-02-19 LAB — CBC WITH DIFFERENTIAL/PLATELET
Abs Immature Granulocytes: 0.09 10*3/uL — ABNORMAL HIGH (ref 0.00–0.07)
Basophils Absolute: 0.1 10*3/uL (ref 0.0–0.1)
Basophils Relative: 0 %
Eosinophils Absolute: 0 10*3/uL (ref 0.0–0.5)
Eosinophils Relative: 0 %
HCT: 28.1 % — ABNORMAL LOW (ref 36.0–46.0)
Hemoglobin: 9.7 g/dL — ABNORMAL LOW (ref 12.0–15.0)
Immature Granulocytes: 1 %
Lymphocytes Relative: 6 %
Lymphs Abs: 1 10*3/uL (ref 0.7–4.0)
MCH: 31.8 pg (ref 26.0–34.0)
MCHC: 34.5 g/dL (ref 30.0–36.0)
MCV: 92.1 fL (ref 80.0–100.0)
Monocytes Absolute: 2.4 10*3/uL — ABNORMAL HIGH (ref 0.1–1.0)
Monocytes Relative: 15 %
Neutro Abs: 12.3 10*3/uL — ABNORMAL HIGH (ref 1.7–7.7)
Neutrophils Relative %: 78 %
Platelets: 335 10*3/uL (ref 150–400)
RBC: 3.05 MIL/uL — ABNORMAL LOW (ref 3.87–5.11)
RDW: 13.1 % (ref 11.5–15.5)
WBC: 15.8 10*3/uL — ABNORMAL HIGH (ref 4.0–10.5)
nRBC: 0 % (ref 0.0–0.2)

## 2023-02-19 LAB — SURGICAL PCR SCREEN
MRSA, PCR: NEGATIVE
Staphylococcus aureus: POSITIVE — AB

## 2023-02-19 LAB — BASIC METABOLIC PANEL
Anion gap: 7 (ref 5–15)
BUN: 18 mg/dL (ref 8–23)
CO2: 24 mmol/L (ref 22–32)
Calcium: 8.2 mg/dL — ABNORMAL LOW (ref 8.9–10.3)
Chloride: 97 mmol/L — ABNORMAL LOW (ref 98–111)
Creatinine, Ser: 1.04 mg/dL — ABNORMAL HIGH (ref 0.44–1.00)
GFR, Estimated: 50 mL/min — ABNORMAL LOW (ref >60)
Glucose, Bld: 148 mg/dL — ABNORMAL HIGH (ref 70–99)
Potassium: 4.8 mmol/L (ref 3.5–5.1)
Sodium: 128 mmol/L — ABNORMAL LOW (ref 135–145)

## 2023-02-19 SURGERY — INSERTION, INTRAMEDULLARY ROD, FEMUR
Anesthesia: General | Laterality: Right

## 2023-02-19 MED ORDER — MENTHOL 3 MG MT LOZG: 1.0000 | LOZENGE | OROMUCOSAL | Status: DC | PRN

## 2023-02-19 MED ORDER — CEFAZOLIN SODIUM-DEXTROSE 2-4 GM/100ML-% IV SOLN
2.0000 g | Freq: Four times a day (QID) | INTRAVENOUS | Status: AC
  Administered 2023-02-19 (×2): 2 g via INTRAVENOUS
  Filled 2023-02-19 (×2): qty 100

## 2023-02-19 MED ORDER — ENOXAPARIN SODIUM 40 MG/0.4ML IJ SOSY
40.0000 mg | PREFILLED_SYRINGE | INTRAMUSCULAR | Status: DC
  Administered 2023-02-20 – 2023-02-21 (×2): 40 mg via SUBCUTANEOUS
  Filled 2023-02-19 (×2): qty 0.4

## 2023-02-19 MED ORDER — PHENYLEPHRINE HCL-NACL 20-0.9 MG/250ML-% IV SOLN: INTRAVENOUS | Status: DC | PRN

## 2023-02-19 MED ORDER — PHENYLEPHRINE 80 MCG/ML (10ML) SYRINGE FOR IV PUSH (FOR BLOOD PRESSURE SUPPORT)
PREFILLED_SYRINGE | INTRAVENOUS | Status: DC | PRN
  Administered 2023-02-19: 80 ug via INTRAVENOUS
  Administered 2023-02-19: 160 ug via INTRAVENOUS

## 2023-02-19 MED ORDER — ROCURONIUM BROMIDE 10 MG/ML (PF) SYRINGE
PREFILLED_SYRINGE | INTRAVENOUS | Status: DC | PRN
  Administered 2023-02-19: 50 mg via INTRAVENOUS
  Administered 2023-02-19: 10 mg via INTRAVENOUS

## 2023-02-19 MED ORDER — ACETAMINOPHEN 10 MG/ML IV SOLN
INTRAVENOUS | Status: AC
  Filled 2023-02-19: qty 100

## 2023-02-19 MED ORDER — FENTANYL CITRATE (PF) 250 MCG/5ML IJ SOLN
INTRAMUSCULAR | Status: DC | PRN
  Administered 2023-02-19 (×5): 25 ug via INTRAVENOUS

## 2023-02-19 MED ORDER — ONDANSETRON HCL 4 MG/2ML IJ SOLN
4.0000 mg | Freq: Four times a day (QID) | INTRAMUSCULAR | Status: DC | PRN
  Administered 2023-02-20 (×2): 4 mg via INTRAVENOUS
  Filled 2023-02-19 (×2): qty 2

## 2023-02-19 MED ORDER — CHLORHEXIDINE GLUCONATE 0.12 % MT SOLN
OROMUCOSAL | Status: AC
  Filled 2023-02-19: qty 15

## 2023-02-19 MED ORDER — METOCLOPRAMIDE HCL 5 MG PO TABS: 5.0000 mg | ORAL_TABLET | Freq: Three times a day (TID) | ORAL | Status: DC | PRN

## 2023-02-19 MED ORDER — SUGAMMADEX SODIUM 200 MG/2ML IV SOLN
INTRAVENOUS | Status: DC | PRN
  Administered 2023-02-19: 150 mg via INTRAVENOUS

## 2023-02-19 MED ORDER — DOCUSATE SODIUM 100 MG PO CAPS
100.0000 mg | ORAL_CAPSULE | Freq: Two times a day (BID) | ORAL | Status: DC
  Administered 2023-02-19 – 2023-02-21 (×4): 100 mg via ORAL
  Filled 2023-02-19 (×4): qty 1

## 2023-02-19 MED ORDER — CEFAZOLIN SODIUM-DEXTROSE 2-3 GM-%(50ML) IV SOLR
INTRAVENOUS | Status: DC | PRN
  Administered 2023-02-19: 2 g via INTRAVENOUS

## 2023-02-19 MED ORDER — NYSTATIN 100000 UNIT/GM EX POWD
Freq: Three times a day (TID) | CUTANEOUS | Status: DC
  Filled 2023-02-19: qty 15

## 2023-02-19 MED ORDER — ONDANSETRON HCL 4 MG/2ML IJ SOLN
INTRAMUSCULAR | Status: DC | PRN
  Administered 2023-02-19: 4 mg via INTRAVENOUS

## 2023-02-19 MED ORDER — FENTANYL CITRATE (PF) 250 MCG/5ML IJ SOLN
INTRAMUSCULAR | Status: AC
  Filled 2023-02-19: qty 5

## 2023-02-19 MED ORDER — CHLORHEXIDINE GLUCONATE CLOTH 2 % EX PADS: 6.0000 | MEDICATED_PAD | Freq: Every day | CUTANEOUS | Status: DC

## 2023-02-19 MED ORDER — METOCLOPRAMIDE HCL 5 MG/ML IJ SOLN
5.0000 mg | Freq: Three times a day (TID) | INTRAMUSCULAR | Status: DC | PRN
  Administered 2023-02-21: 10 mg via INTRAVENOUS
  Filled 2023-02-19: qty 2

## 2023-02-19 MED ORDER — CHLORHEXIDINE GLUCONATE 0.12 % MT SOLN
15.0000 mL | Freq: Once | OROMUCOSAL | Status: AC
  Administered 2023-02-19: 15 mL via OROMUCOSAL

## 2023-02-19 MED ORDER — PROPOFOL 10 MG/ML IV BOLUS
INTRAVENOUS | Status: AC
  Filled 2023-02-19: qty 20

## 2023-02-19 MED ORDER — LACTATED RINGERS IV SOLN: INTRAVENOUS | Status: DC

## 2023-02-19 MED ORDER — ACETAMINOPHEN 10 MG/ML IV SOLN
INTRAVENOUS | Status: DC | PRN
  Administered 2023-02-19: 1000 mg via INTRAVENOUS

## 2023-02-19 MED ORDER — SODIUM CHLORIDE 0.9 % IV SOLN: INTRAVENOUS | Status: AC

## 2023-02-19 MED ORDER — ACETAMINOPHEN 325 MG PO TABS
650.0000 mg | ORAL_TABLET | Freq: Three times a day (TID) | ORAL | Status: DC
  Administered 2023-02-19 – 2023-02-21 (×7): 650 mg via ORAL
  Filled 2023-02-19 (×7): qty 2

## 2023-02-19 MED ORDER — PHENYLEPHRINE HCL-NACL 20-0.9 MG/250ML-% IV SOLN
INTRAVENOUS | Status: DC | PRN
  Administered 2023-02-19: 50 ug/min via INTRAVENOUS

## 2023-02-19 MED ORDER — OXYCODONE HCL 5 MG PO TABS
5.0000 mg | ORAL_TABLET | ORAL | Status: DC | PRN
  Administered 2023-02-20 – 2023-02-21 (×4): 5 mg via ORAL
  Filled 2023-02-19 (×4): qty 1

## 2023-02-19 MED ORDER — TRANEXAMIC ACID-NACL 1000-0.7 MG/100ML-% IV SOLN
1000.0000 mg | Freq: Once | INTRAVENOUS | Status: AC
  Administered 2023-02-19: 1000 mg via INTRAVENOUS
  Filled 2023-02-19: qty 100

## 2023-02-19 MED ORDER — PROPOFOL 10 MG/ML IV BOLUS
INTRAVENOUS | Status: DC | PRN
  Administered 2023-02-19: 20 mg via INTRAVENOUS
  Administered 2023-02-19: 70 mg via INTRAVENOUS
  Administered 2023-02-19: 10 mg via INTRAVENOUS
  Administered 2023-02-19: 20 mg via INTRAVENOUS

## 2023-02-19 MED ORDER — ONDANSETRON HCL 4 MG PO TABS: 4.0000 mg | ORAL_TABLET | Freq: Four times a day (QID) | ORAL | Status: DC | PRN

## 2023-02-19 MED ORDER — PROPOFOL 500 MG/50ML IV EMUL
INTRAVENOUS | Status: DC | PRN
  Administered 2023-02-19: 150 ug/kg/min via INTRAVENOUS
  Administered 2023-02-19: 100 ug/kg/min via INTRAVENOUS

## 2023-02-19 MED ORDER — PHENOL 1.4 % MT LIQD: 1.0000 | OROMUCOSAL | Status: DC | PRN

## 2023-02-19 MED ORDER — ORAL CARE MOUTH RINSE: 15.0000 mL | Freq: Once | OROMUCOSAL | Status: AC

## 2023-02-19 MED ORDER — ALBUMIN HUMAN 5 % IV SOLN: INTRAVENOUS | Status: DC | PRN

## 2023-02-19 MED ORDER — MUPIROCIN 2 % EX OINT
1.0000 | TOPICAL_OINTMENT | Freq: Two times a day (BID) | CUTANEOUS | Status: DC
  Administered 2023-02-19 – 2023-02-21 (×5): 1 via NASAL
  Filled 2023-02-19 (×2): qty 22

## 2023-02-19 MED ORDER — 0.9 % SODIUM CHLORIDE (POUR BTL) OPTIME
TOPICAL | Status: DC | PRN
  Administered 2023-02-19: 1000 mL

## 2023-02-19 SURGICAL SUPPLY — 51 items
BAG COUNTER SPONGE SURGICOUNT (BAG) ×1 IMPLANT
BIT DRILL 4.3MMS DISTAL GRDTED (BIT) IMPLANT
BNDG COHESIVE 6X5 TAN ST LF (GAUZE/BANDAGES/DRESSINGS) IMPLANT
BOLT INSERTION JIG (BOLT) IMPLANT
BRUSH SCRUB EZ PLAIN DRY (MISCELLANEOUS) ×2 IMPLANT
COVER PERINEAL POST (MISCELLANEOUS) ×1 IMPLANT
COVER SURGICAL LIGHT HANDLE (MISCELLANEOUS) ×2 IMPLANT
DRAPE C-ARMOR (DRAPES) ×1 IMPLANT
DRAPE HALF SHEET 40X57 (DRAPES) IMPLANT
DRAPE ORTHO SPLIT 77X108 STRL (DRAPES) ×2
DRAPE SURG ORHT 6 SPLT 77X108 (DRAPES) ×2 IMPLANT
DRAPE U-SHAPE 47X51 STRL (DRAPES) ×1 IMPLANT
DRESSING MEPILEX FLEX 4X4 (GAUZE/BANDAGES/DRESSINGS) ×1 IMPLANT
DRILL 4.3MMS DISTAL GRADUATED (BIT) ×1
DRIVER HEX DIS SCREW 3.5 (ORTHOPEDIC DISPOSABLE SUPPLIES) IMPLANT
DRSG MEPILEX FLEX 4X4 (GAUZE/BANDAGES/DRESSINGS) ×3
DRSG MEPILEX POST OP 4X8 (GAUZE/BANDAGES/DRESSINGS) ×1 IMPLANT
ELECT REM PT RETURN 9FT ADLT (ELECTROSURGICAL) ×1
ELECTRODE REM PT RTRN 9FT ADLT (ELECTROSURGICAL) ×1 IMPLANT
GLOVE BIO SURGEON STRL SZ7.5 (GLOVE) ×1 IMPLANT
GLOVE BIO SURGEON STRL SZ8 (GLOVE) ×1 IMPLANT
GLOVE BIOGEL PI IND STRL 7.5 (GLOVE) ×1 IMPLANT
GLOVE BIOGEL PI IND STRL 8 (GLOVE) ×1 IMPLANT
GLOVE SURG ORTHO LTX SZ7.5 (GLOVE) ×2 IMPLANT
GOWN STRL REUS W/ TWL LRG LVL3 (GOWN DISPOSABLE) ×2 IMPLANT
GOWN STRL REUS W/ TWL XL LVL3 (GOWN DISPOSABLE) ×1 IMPLANT
GOWN STRL REUS W/TWL LRG LVL3 (GOWN DISPOSABLE) ×2
GOWN STRL REUS W/TWL XL LVL3 (GOWN DISPOSABLE) ×1
GUIDEPIN VERSANAIL DSP 3.2X444 (ORTHOPEDIC DISPOSABLE SUPPLIES) IMPLANT
GUIDEWIRE BALL NOSE 100CM (WIRE) IMPLANT
KIT BASIN OR (CUSTOM PROCEDURE TRAY) ×1 IMPLANT
KIT TURNOVER KIT B (KITS) ×1 IMPLANT
MANIFOLD NEPTUNE II (INSTRUMENTS) ×1 IMPLANT
NAIL HIP FRACTURE 11X380MM (Nail) IMPLANT
NS IRRIG 1000ML POUR BTL (IV SOLUTION) ×1 IMPLANT
PACK GENERAL/GYN (CUSTOM PROCEDURE TRAY) ×1 IMPLANT
PAD ARMBOARD 7.5X6 YLW CONV (MISCELLANEOUS) ×2 IMPLANT
SCREW BONE CORTICAL 5.0X42 (Screw) IMPLANT
SCREW CANN THRD AFF 10.5X100 (Screw) IMPLANT
STAPLER VISISTAT 35W (STAPLE) ×1 IMPLANT
STOCKINETTE IMPERVIOUS LG (DRAPES) IMPLANT
SUT ETHILON 2 0 FS 18 (SUTURE) ×1 IMPLANT
SUT VIC AB 0 CT1 27 (SUTURE) ×1
SUT VIC AB 0 CT1 27XBRD ANBCTR (SUTURE) ×1 IMPLANT
SUT VIC AB 1 CT1 27 (SUTURE) ×1
SUT VIC AB 1 CT1 27XBRD ANBCTR (SUTURE) ×1 IMPLANT
SUT VIC AB 2-0 CT1 27 (SUTURE) ×1
SUT VIC AB 2-0 CT1 TAPERPNT 27 (SUTURE) ×1 IMPLANT
TOWEL GREEN STERILE (TOWEL DISPOSABLE) ×2 IMPLANT
TOWEL GREEN STERILE FF (TOWEL DISPOSABLE) ×1 IMPLANT
WATER STERILE IRR 1000ML POUR (IV SOLUTION) ×1 IMPLANT

## 2023-02-19 NOTE — Consult Note (Signed)
Orthopaedic Trauma Service (OTS) Consultation   Patient ID: Tasha Avila MRN: [id number] DOB/AGE: [birth year] 89 y.o.   Reason for Consult:right hip fracture Referring Physician: Darliss Cheney, MD and Georgeanna Harrison, MD  HPI: Tasha Avila is an 89 y.o. female who sustained fall on Saturday with right hip fracture. Uses cane at [facility] in [place], dementia, no recent falls.  Past Medical History:  Diagnosis Date   Anemia    Arthritis    GERD (gastroesophageal reflux disease)    Hypercholesterolemia    Hypertension    Hypothyroidism     Past Surgical History:  Procedure Laterality Date   ABDOMINAL HYSTERECTOMY  1960s   bladder tack at the same time, ovaries not removed   LAPAROSCOPIC CHOLECYSTECTOMY  5/11    Family History  Problem Relation Age of Onset   Stroke Mother    Lymphoma Brother    Diabetes Brother    Diabetes Brother        x2   Thyroid disease Brother    Stroke Brother     Social History:  reports that she has never smoked. She has never used smokeless tobacco. She reports that she does not drink alcohol and does not use drugs.  Allergies: No Known Allergies  Medications: Prior to Admission:  Medications Prior to Admission  Medication Sig Dispense Refill Last Dose   acetaminophen (TYLENOL) 500 MG tablet Take 500 mg by mouth every 6 (six) hours as needed.      Cholecalciferol (VITAMIN D3) 125 MCG (5000 UT) capsule Take 1 capsule (5,000 Units total) by mouth daily. 30 capsule 0    cyanocobalamin 500 MCG TABS Take 500 mcg by mouth daily. 30 tablet 0    DULoxetine (CYMBALTA) 60 MG capsule Take 1 capsule (60 mg total) by mouth daily. 90 capsule 2    Homeopathic Products ([employer] LEG CRAMPS PAIN RELIEF PO) Take by mouth as needed.      levothyroxine (SYNTHROID) 88 MCG tablet Take 1 tablet by mouth once daily 90 tablet 3    lisinopril (ZESTRIL) 40 MG tablet Take 1 tablet (40 mg total) by mouth daily. 90 tablet 2    pantoprazole (PROTONIX)  40 MG tablet Take 1 tablet (40 mg total) by mouth daily. 90 tablet 2    Propylene Glycol (SYSTANE BALANCE OP) Apply 1 drop to eye as needed.      psyllium (METAMUCIL) 58.6 % packet Take 1 packet by mouth daily.      rosuvastatin (CRESTOR) 5 MG tablet TAKE 1 TABLET BY MOUTH ON MONDAY, WEDNESDAY AND FRIDAY 40 tablet 1    SALINE NASAL SPRAY NA Place into the nose as needed.       Results for orders placed or performed during the hospital encounter of 02/18/23 (from the past 48 hour(s))  Basic metabolic panel     Status: Abnormal   Collection Time: 02/18/23  1:37 AM  Result Value Ref Range   Sodium 124 (L) 135 - 145 mmol/L   Potassium 4.6 3.5 - 5.1 mmol/L   Chloride 93 (L) 98 - 111 mmol/L   CO2 25 22 - 32 mmol/L   Glucose, Bld 174 (H) 70 - 99 mg/dL    Comment: Glucose reference range applies only to samples taken after fasting for at least 8 hours.   BUN 14 8 - 23 mg/dL   Creatinine, Ser 0.78 0.44 - 1.00 mg/dL   Calcium 8.5 (L) 8.9 - 10.3 mg/dL   GFR, Estimated >  60 >60 mL/min    Comment: (NOTE) Calculated using the CKD-EPI Creatinine Equation (2021)    Anion gap 6 5 - 15    Comment: Performed at [facility], [address] [place], [place] [address]  CBC with Differential     Status: Abnormal   Collection Time: 02/18/23  1:37 AM  Result Value Ref Range   WBC 17.0 (H) 4.0 - 10.5 K/uL   RBC 3.73 (L) 3.87 - 5.11 MIL/uL   Hemoglobin 11.5 (L) 12.0 - 15.0 g/dL   HCT 33.9 (L) 36.0 - 46.0 %   MCV 90.9 80.0 - 100.0 fL   MCH 30.8 26.0 - 34.0 pg   MCHC 33.9 30.0 - 36.0 g/dL   RDW 12.8 11.5 - 15.5 %   Platelets 343 150 - 400 K/uL   nRBC 0.0 0.0 - 0.2 %   Neutrophils Relative % 85 %   Neutro Abs 14.4 (H) 1.7 - 7.7 K/uL   Lymphocytes Relative 4 %   Lymphs Abs 0.7 0.7 - 4.0 K/uL   Monocytes Relative 11 %   Monocytes Absolute 1.8 (H) 0.1 - 1.0 K/uL   Eosinophils Relative 0 %   Eosinophils Absolute 0.0 0.0 - 0.5 K/uL   Basophils Relative 0 %   Basophils Absolute 0.0 0.0 - 0.1  K/uL   Immature Granulocytes 0 %   Abs Immature Granulocytes 0.07 0.00 - 0.07 K/uL    Comment: Performed at [facility], [address] [place], [place] [address]  Protime-INR     Status: None   Collection Time: 02/18/23  1:37 AM  Result Value Ref Range   Prothrombin Time 13.7 11.4 - 15.2 seconds   INR 1.1 0.8 - 1.2    Comment: (NOTE) INR goal varies based on device and disease states. Performed at [facility], [address] [place], [place] [address]   Type and screen [name]     Status: None   Collection Time: 02/18/23  6:35 AM  Result Value Ref Range   ABO/RH(D) A NEG    Antibody Screen NEG    Sample Expiration      02/21/2023,2359 Performed at [facility], [place] [address], [place], [place] [address]   CBC     Status: Abnormal   Collection Time: 02/18/23  6:37 AM  Result Value Ref Range   WBC 16.5 (H) 4.0 - 10.5 K/uL   RBC 3.29 (L) 3.87 - 5.11 MIL/uL   Hemoglobin 10.2 (L) 12.0 - 15.0 g/dL   HCT 30.2 (L) 36.0 - 46.0 %   MCV 91.8 80.0 - 100.0 fL   MCH 31.0 26.0 - 34.0 pg   MCHC 33.8 30.0 - 36.0 g/dL   RDW 12.9 11.5 - 15.5 %   Platelets 316 150 - 400 K/uL   nRBC 0.0 0.0 - 0.2 %    Comment: Performed at [facility], [place] [address] [place], [place] [id number]  Basic metabolic panel     Status: Abnormal   Collection Time: 02/18/23  6:37 AM  Result Value Ref Range   Sodium 127 (L) 135 - 145 mmol/L   Potassium 4.7 3.5 - 5.1 mmol/L   Chloride 95 (L) 98 - 111 mmol/L   CO2 25 22 - 32 mmol/L   Glucose, Bld 158 (H) 70 - 99 mg/dL    Comment: Glucose reference range applies only to samples taken after fasting for at least 8 hours.  BUN 14 8 - 23 mg/dL   Creatinine, Ser 1.06 (H) 0.44 - 1.00 mg/dL   Calcium 8.5 (L) 8.9 - 10.3 mg/dL   GFR, Estimated 49 (L) >60 mL/min    Comment: (NOTE) Calculated using the CKD-EPI Creatinine Equation (2021)    Anion gap 7 5 - 15    Comment: Performed at [facility] [address], [place], [place] [address]  Osmolality     Status: Abnormal   Collection Time: 02/18/23  6:37 AM  Result Value Ref Range   Osmolality 273 (L) 275 - 295 mOsm/kg    Comment: Performed at [facility], [place] [address], [place], [place] [id number]  Basic metabolic panel     Status: Abnormal   Collection Time: 02/18/23 12:02 PM  Result Value Ref Range   Sodium 128 (L) 135 - 145 mmol/L   Potassium 4.9 3.5 - 5.1 mmol/L   Chloride 96 (L) 98 - 111 mmol/L   CO2 22 22 - 32 mmol/L   Glucose, Bld 128 (H) 70 - 99 mg/dL    Comment: Glucose reference range applies only to samples taken after fasting for at least 8 hours.   BUN 15 8 - 23 mg/dL   Creatinine, Ser 1.16 (H) 0.44 - 1.00 mg/dL   Calcium 8.2 (L) 8.9 - 10.3 mg/dL   GFR, Estimated 44 (L) >60 mL/min    Comment: (NOTE) Calculated using the CKD-EPI Creatinine Equation (2021)    Anion gap 10 5 - 15    Comment: Performed at [facility] [address] [place], [place] [address]  Sodium     Status: Abnormal   Collection Time: 02/18/23  8:19 PM  Result Value Ref Range   Sodium 129 (L) 135 - 145 mmol/L    Comment: Performed at [facility] [address], [place], [place] [address]  CBC with Differential/Platelet     Status: Abnormal   Collection Time: 02/19/23  4:30 AM  Result Value Ref Range   WBC 15.8 (H) 4.0 - 10.5 K/uL   RBC 3.05 (L) 3.87 - 5.11 MIL/uL   Hemoglobin 9.7 (L) 12.0 - 15.0 g/dL   HCT 28.1 (L) 36.0 - 46.0 %   MCV 92.1 80.0 - 100.0 fL   MCH 31.8 26.0 - 34.0 pg   MCHC 34.5 30.0 - 36.0 g/dL   RDW 13.1 11.5 - 15.5 %   Platelets 335 150 - 400 K/uL   nRBC 0.0 0.0 - 0.2 %   Neutrophils Relative % 78 %   Neutro Abs 12.3 (H) 1.7 - 7.7 K/uL   Lymphocytes Relative 6 %   Lymphs Abs 1.0 0.7 - 4.0 K/uL   Monocytes Relative 15 %   Monocytes Absolute 2.4 (H) 0.1 - 1.0 K/uL   Eosinophils Relative 0 %   Eosinophils Absolute 0.0 0.0 - 0.5 K/uL   Basophils Relative 0 %   Basophils Absolute 0.1 0.0 - 0.1 K/uL    Immature Granulocytes 1 %   Abs Immature Granulocytes 0.09 (H) 0.00 - 0.07 K/uL    Comment: Performed at [facility] [address] [place], [place] [id number]  Basic metabolic panel     Status: Abnormal   Collection Time: 02/19/23  4:30 AM  Result Value Ref Range   Sodium 128 (L) 135 - 145 mmol/L   Potassium 4.8 3.5 - 5.1 mmol/L   Chloride 97 (L) 98 - 111 mmol/L   CO2 24 22 - 32  mmol/L   Glucose, Bld 148 (H) 70 - 99 mg/dL    Comment: Glucose reference range applies only to samples taken after fasting for at least 8 hours.   BUN 18 8 - 23 mg/dL   Creatinine, Ser 1.04 (H) 0.44 - 1.00 mg/dL   Calcium 8.2 (L) 8.9 - 10.3 mg/dL   GFR, Estimated 50 (L) >60 mL/min    Comment: (NOTE) Calculated using the CKD-EPI Creatinine Equation (2021)    Anion gap 7 5 - 15    Comment: Performed at [facility] [address] [place], [place] [address]    DG Knee 1-2 Views Right  Result Date: 02/19/2023 CLINICAL DATA:  Right hip fracture. EXAM: RIGHT KNEE - 1-2 VIEW COMPARISON:  None Available. FINDINGS: A small calcification is seen adjacent to the medial femoral condyle, suggesting Pellegrini-Stieda lesion. No dislocation. Mild degenerative changes are present in the patellofemoral compartment. There is a trace suprapatellar joint effusion. Vascular calcifications are present in the soft tissues. IMPRESSION: 1. Small calcification adjacent to the medial femoral condyle, compatible with Pellegrini-Stieda lesion. 2. Mild degenerative changes in the patellofemoral compartment. 3. Mild suprapatellar joint effusion. Electronically Signed   By: Brett Fairy M.D.   On: 02/19/2023 00:02   CT HEAD WO CONTRAST (5MM)  Result Date: 02/18/2023 CLINICAL DATA:  Follow-up after fall EXAM: CT HEAD WITHOUT CONTRAST TECHNIQUE: Contiguous axial images were obtained from the base of the skull through the vertex without intravenous contrast. RADIATION DOSE REDUCTION: This exam was performed according to the  departmental dose-optimization program which includes automated exposure control, adjustment of the mA and/or kV according to patient size and/or use of iterative reconstruction technique. COMPARISON:  05/12/2022 FINDINGS: Brain: No evidence of acute infarction, hemorrhage, hydrocephalus, extra-axial collection or mass lesion/mass effect. Chronic right occipital infarct. Generalized cerebral volume loss. Vascular: No hyperdense vessel or unexpected calcification. Skull: Normal. Negative for fracture or focal lesion. Sinuses/Orbits: No acute finding. IMPRESSION: 1. No evidence of injury. 2. Chronic right occipital infarct. Electronically Signed   By: Jorje Guild M.D.   On: 02/18/2023 08:24   DG Chest 1 View  Result Date: 02/18/2023 CLINICAL DATA:  Right hip fracture.  Fall. EXAM: CHEST  1 VIEW COMPARISON:  09/27/2021 FINDINGS: Heart and mediastinal contours are within normal limits. No focal opacities or effusions. No acute bony abnormality. IMPRESSION: No active disease. Electronically Signed   By: Rolm Baptise M.D.   On: 02/18/2023 01:38   DG HIP UNILAT WITH PELVIS 2-3 VIEWS RIGHT  Result Date: 02/18/2023 CLINICAL DATA:  Fall out of bed, right hip pain EXAM: DG HIP (WITH OR WITHOUT PELVIS) 2-3V RIGHT COMPARISON:  None Available. FINDINGS: There is a comminuted right femoral intertrochanteric fracture with displaced fracture fragments and varus angulation. No subluxation or dislocation. IMPRESSION: Angulated displaced right femoral intertrochanteric fracture. Electronically Signed   By: Rolm Baptise M.D.   On: 02/18/2023 01:37    Intake/Output      03/17 0701 03/18 0700 03/18 0701 03/19 0700   I.V. 696.4    Total Intake 696.4    Net +696.4            ROS No recent fever, bleeding abnormalities, urologic dysfunction, GI problems, or weight gain.  Blood pressure 123/64, pulse 84, temperature 100.3 F (37.9 C), temperature source Oral, resp. rate 16, SpO2 94 %. Physical Exam Pleasant, no  distress RLE Shortening  Edema/ swelling controlled  Sens: DPN, SPN, TN intact  Motor: EHL, FHL, and lessor toe ext  and flex all intact grossly  Brisk cap refill, warm to touch No wheezing  Assessment/Plan:  Right hip fracture for IMN  The risks and benefits of surgery were discussed with the patient's daughter, including the possibility of infection, nerve injury, vessel injury, wound breakdown, arthritis, symptomatic hardware, DVT/ PE, loss of motion, malunion, nonunion, and need for further surgery among others. These risks were acknowledged and consent provided to proceed.  Altamese Chatham, MD [facility], [facility] [phone]  02/19/2023, 8:16 AM  [facility] [place] [id number] [phone] Jenetta [phone] (F)    After 5pm and on the weekends please log on to Amion, go to orthopaedics and the look under the [facility] Call for the provider(s) on call. You can also call our office at [phone] and then follow the prompts to be connected to the call team.

## 2023-02-19 NOTE — TOC CAGE-AID Note (Signed)
Transition of Care [facility]) - CAGE-AID Screening   Patient Details  Name: Tasha Avila MRN: [id number] Date of Birth: 1928-05-26  [facility] CM/SW Contact:    Clovis Cao, RN Phone Number: [phone] 02/19/2023, 1:11 PM   Clinical Narrative: Pt denies alcohol or drug use.  Screening complete.   CAGE-AID Screening:    Have You Ever Felt You Ought to Cut Down on Your Drinking or Drug Use?: No Have People Annoyed You By Critizing Your Drinking Or Drug Use?: No Have You Felt Bad Or Guilty About Your Drinking Or Drug Use?: No Have You Ever Had a Drink or Used Drugs First Thing In The Morning to Steady Your Nerves or to Get Rid of a Hangover?: No CAGE-AID Score: 0  Substance Abuse Education Offered: No

## 2023-02-19 NOTE — Op Note (Signed)
02/19/2023  11:09 AM  PATIENT:  Tasha Avila  [birth year] female   MEDICAL RECORD NUMBER: [id number]  PRE-OPERATIVE DIAGNOSIS:  RIGHT HIP FRACTURE  POST-OPERATIVE DIAGNOSIS:  RIGHT HIP FRACTURE  PROCEDURE:  INTRAMEDULLARY NAILING OF RIGHT FOUR PART INTERTROCHANTERIC HIP using a Biomet Affixus nail 11 X 380 mm.  SURGEON:  Astrid Divine. Marcelino Scot, M.D.  ASSISTANT:  Ainsley Spinner, PA-C.  ANESTHESIA:  General.  COMPLICATIONS:  None.  ESTIMATED BLOOD LOSS:  Less than 150 mL.  DISPOSITION:  To PACU.  CONDITION:  Stable.  DELAY START OF DVT PROPHYLAXIS BECAUSE OF BLEEDING RISK: NO  BRIEF SUMMARY AND INDICATION OF PROCEDURE:  Tasha Avila is a 89 y.o. year- old with multiple medical problems.  I discussed with the patient and family risks and benefits of surgical treatment including the potential for malunion, nonunion, symptomatic hardware, heart attack, stroke, neurovascular injury, bleeding, and others.  After full discussion, the patient and family wished to proceed.  BRIEF SUMMARY OF PROCEDURE:  The patient was taken to the operating room where general anesthesia was induced.  She was positioned supine on the Hana fracture table.  A closed reduction maneuver was performed of the fractured proximal femur and this was confirmed on both AP and lateral xray views. A thorough scrub and wash with chlorhexidine and then Betadine scrub and paint was performed.  After sterile drapes and time-out, a long instrument was used to identify the appropriate starting position under C-arm on both AP and lateral images.  A 3 cm incision was made proximal to the greater trochanter.  The curved cannulated awl was inserted just medial to the tip of the lateral trochanter and then the starting guidewire advanced into the proximal femur.  This was checked on AP and lateral views.  The starting reamer was engaged with the soft tissue protected by a sleeve.  The curved ball-tipped guidewire was then  inserted, making sure it was just posterior as possible in the distal femur and across the fracture site, which stayed in a reduced position.  It was sequentially reamed up to 13 mm and an 11 x 380 mm nail inserted to the appropriate depth.  The guidewire for the lag screw was then inserted with the appropriate anteversion to make sure it was in a center-center position.  This was measured and the lag screw placed with excellent purchase and position checked on both views.  The set screw was then engaged within the groove of the lag screw, which was allowed to telescope.  Traction was released and compression achieved with the screw.  This was followed by placement of one distal locking screw using perfect circle technique.  This was confirmed on AP and lateral images. Wounds were irrigated thoroughly, closed in a standard layered fashion. Sterile gently compressive dressings were applied.  Ainsley Spinner, PA-C, assisted throughout.  The patient was awakened from anesthesia and transported to the PACU in stable condition.  PROGNOSIS:  The patient will be weightbearing as tolerated with physical therapy beginning DVT prophylaxis with Lovenox for now but no long term prophylaxis because of her dementia.  She has no range of motion precautions.  We will continue to follow through at the hospital.  Anticipate follow up in the office in 2 weeks for removal of sutures and further evaluation.     Astrid Divine. Marcelino Scot, M.D.

## 2023-02-19 NOTE — Anesthesia Procedure Notes (Signed)
Spinal  Patient location during procedure: OR Start time: 02/19/2023 8:35 AM End time: 02/19/2023 8:45 AM Reason for block: surgical anesthesia Staffing Performed: anesthesiologist  Anesthesiologist: Roderic Palau, MD Performed by: Roderic Palau, MD Authorized by: Roderic Palau, MD   Preanesthetic Checklist Completed: patient identified, IV checked, risks and benefits discussed, surgical consent, monitors and equipment checked, pre-op evaluation and timeout performed Spinal Block Patient position: left lateral decubitus Prep: DuraPrep Patient monitoring: cardiac monitor, continuous pulse ox and blood pressure Approach: midline Location: L3-4 Injection technique: single-shot Needle Needle type: Quincke  Needle gauge: 22 G Needle length: 9 cm Assessment Events: other event Additional Notes Functioning IV was confirmed and monitors were applied. Sterile prep and drape, including hand hygiene and sterile gloves were used. The patient was positioned and the spine was prepped.   Attempted spinal in the midline as well as the paramedian approach. Unable to locate the space. Procedure aborted. Will proceed with GETA.

## 2023-02-19 NOTE — Anesthesia Preprocedure Evaluation (Addendum)
Anesthesia Evaluation  Patient identified by MRN, date of birth, ID band Patient awake    Reviewed: Allergy & Precautions, H&P , NPO status , Patient's Chart, lab work & pertinent test results  Airway Mallampati: II  TM Distance: >3 FB Neck ROM: Full    Dental no notable dental hx. (+) Teeth Intact, Dental Advisory Given   Pulmonary neg pulmonary ROS   Pulmonary exam normal breath sounds clear to auscultation       Cardiovascular hypertension, Pt. on medications  Rhythm:Regular Rate:Normal     Neuro/Psych  Headaches   Depression   Dementia    GI/Hepatic Neg liver ROS,GERD  Medicated,,  Endo/Other  Hypothyroidism    Renal/GU negative Renal ROS  negative genitourinary   Musculoskeletal  (+) Arthritis , Osteoarthritis,    Abdominal   Peds  Hematology  (+) Blood dyscrasia, anemia   Anesthesia Other Findings   Reproductive/Obstetrics negative OB ROS                             Anesthesia Physical Anesthesia Plan  ASA: 2  Anesthesia Plan: Spinal   Post-op Pain Management: Ofirmev IV (intra-op)*   Induction: Intravenous  PONV Risk Score and Plan: 3 and Ondansetron, Propofol infusion and Treatment may vary due to age or medical condition  Airway Management Planned: Natural Airway and Simple Face Mask  Additional Equipment:   Intra-op Plan:   Post-operative Plan:   Informed Consent: I have reviewed the patients History and Physical, chart, labs and discussed the procedure including the risks, benefits and alternatives for the proposed anesthesia with the patient or authorized representative who has indicated his/her understanding and acceptance.   Patient has DNR.  Discussed DNR with power of attorney and Continue DNR.   Dental advisory given  Plan Discussed with: CRNA  Anesthesia Plan Comments:        Anesthesia Quick Evaluation

## 2023-02-19 NOTE — Progress Notes (Signed)
PROGRESS NOTE    Tasha Avila  [id number] DOB: Feb 10, 1928 DOA: 02/18/2023 PCP: Einar Pheasant, MD   Brief Narrative:  HPI: Tasha Avila is a 89 y.o. female with medical history significant of dementia, hypertension, hyperlipidemia, GERD, hypothyroidism, depression, anemia, history of non-Hodgkin's lymphoma, CKD stage II-IIIa presented to [facility] ED from [facility] living for evaluation of right hip pain after a mechanical fall.  In the ED, vital signs stable.  Labs significant for WBC 17.0, hemoglobin 11.5 (previously 12-14 range), MCV 90.9, sodium 124, chloride 93, glucose 174, calcium 8.5.  X-ray of right hip/pelvis showing angulated displaced right femoral intertrochanteric fracture.  Chest x-ray showing no active disease.  EKG showing sinus rhythm and no acute ischemic changes. ED physician consulted Dr. Mable Fill with orthopedics who recommended admission at [facility].  Patient received morphine and Zofran in the ED.   Patient resting in the bed comfortably, smiling.  She is extremely hard of hearing and is not able to give any history.  Daughter [name] at bedside states patient has dementia and recently moved into the memory care unit at [facility] on March 1st.  She was informed that the patient had a fall at her facility tonight and was found on the floor.  When staff tried to help her stand up, she could not put any weight on her legs.  She complained of pain in her right leg.  Unknown whether she hit her head or lost consciousness.  Daughter states patient otherwise has been in her usual state of health and recently had regular checkups by her PCP and cardiologist.  She is not on anticoagulation or antiplatelet agents.  Daughter is not aware of patient having any vomiting or diarrhea.  She does report patient having history of frequent UTIs.  Assessment & Plan:   Principal Problem:   Closed right hip fracture (HCC) Active Problems:   Essential  hypertension   Non Hodgkin's lymphoma (HCC)   Depression   Normocytic anemia   Hyponatremia   Leukocytosis   Fall   Chronic kidney disease, stage II (mild)   Dementia ([place])  Angulated displaced right femoral intertrochanteric fracture Secondary to a mechanical fall.  Underwent hip surgery 02/19/2023.  Management per orthopedics.  Acute hyponatremia Possibly medication induced as she is on Cymbalta. ?Poor po intake.  Not on diuretics. Sodium 124 upon presentation but improved to 128 today after receiving some IV fluids.  Continue to hold Cymbalta and resume IV fluids.   Leukocytosis WBC 17.0 upon admission, improving, no fever or signs of sepsis.  Chest x-ray not suggestive of pneumonia.  UA pending.     Unwitnessed fall Unknown whether patient hit her head or lost consciousness.  Awake and alert at this time and appears comfortable.  She is fully alert and moving all extremities spontaneously.  CT head shows chronic right occipital infarct but nothing acute.   Normocytic anemia Hemoglobin 11.5, previously in the 12-14 range.  Type and screen, continue to monitor CBC.   CKD stage II-IIIa Renal function stable, continue to monitor.   Dementia Delirium precautions.   History of Non-Hodgkin's lymphoma Outpatient oncology follow-up.   Depression Hold Cymbalta given hyponatremia.   Hypertension: Blood pressure slightly low.  Continue to hold lisinopril.  Hyperlipidemia: Will resume Crestor starting tomorrow.  Hypothyroidism: TSH normal on 10/13/2022.  Will resume Synthroid.  GERD: Will resume PPI   DVT prophylaxis: SCDs Start: 02/18/23 [id number]   Code Status: DNR  Family Communication:  None present  at bedside.   Status is: Inpatient Remains inpatient appropriate because: Patient just went orthopedic surgery.   Estimated body mass index is 28.13 kg/m as calculated from the following:   Height as of 02/12/23: 5\' 2"  (1.575 m).   Weight as of 02/12/23: 69.8 kg.     Nutritional Assessment: There is no height or weight on file to calculate BMI.. Seen by dietician.  I agree with the assessment and plan as outlined below: Nutrition Status: Nutrition Problem: Increased nutrient needs Etiology: hip fracture Signs/Symptoms: estimated needs Interventions: Refer to RD note for recommendations  . Skin Assessment: I have examined the patient's skin and I agree with the wound assessment as performed by the wound care RN as outlined below:    Consultants:  Orthopedics  Procedures:  As above  Antimicrobials:  Anti-infectives (From admission, onward)    None         Subjective: Patient seen and Szymon after surgery.  She was groggy/under the influence of anesthesia and at baseline, she is usually only alert and not oriented due to her advanced dementia.  Objective: Vitals:   02/19/23 0727 02/19/23 1045 02/19/23 1100 02/19/23 1115  BP: 123/64 136/88 (!) 126/24 (!) 123/110  Pulse: 84 71 69 68  Resp: 16 15 15 17   Temp: 100.3 F (37.9 C) (!) 97.3 F (36.3 C)  98.2 F (36.8 C)  TempSrc: Oral     SpO2: 94% 95% 97% 96%    Intake/Output Summary (Last 24 hours) at 02/19/2023 1149 Last data filed at 02/19/2023 1039 Gross per 24 hour  Intake 2046.35 ml  Output 25 ml  Net 2021.35 ml   There were no vitals filed for this visit.  Examination:  General exam: Appears calm and comfortable and slightly lethargic. Respiratory system: Clear to auscultation. Respiratory effort normal. Cardiovascular system: S1 & S2 heard, RRR. No JVD, murmurs, rubs, gallops or clicks. No pedal edema. Gastrointestinal system: Abdomen is nondistended, soft and nontender. No organomegaly or masses felt. Normal bowel sounds heard.   Data Reviewed: I have personally reviewed following labs and imaging studies  CBC: Recent Labs  Lab 02/18/23 0137 02/18/23 0637 02/19/23 0430  WBC 17.0* 16.5* 15.8*  NEUTROABS 14.4*  --  12.3*  HGB 11.5* 10.2* 9.7*  HCT 33.9* 30.2*  28.1*  MCV 90.9 91.8 92.1  PLT 343 316 [id number]    Basic Metabolic Panel: Recent Labs  Lab 02/18/23 0137 02/18/23 0637 02/18/23 1202 02/18/23 2019 02/19/23 0430  NA 124* 127* 128* 129* 128*  K 4.6 4.7 4.9  --  4.8  CL 93* 95* 96*  --  97*  CO2 25 25 22   --  24  GLUCOSE 174* 158* 128*  --  148*  BUN 14 14 15   --  18  CREATININE 0.78 1.06* 1.16*  --  1.04*  CALCIUM 8.5* 8.5* 8.2*  --  8.2*    GFR: Estimated Creatinine Clearance: 31.6 mL/min (A) (by C-G formula based on SCr of 1.04 mg/dL (H)). Liver Function Tests: No results for input(s): "AST", "ALT", "ALKPHOS", "BILITOT", "PROT", "ALBUMIN" in the last 168 hours. No results for input(s): "LIPASE", "AMYLASE" in the last 168 hours. No results for input(s): "AMMONIA" in the last 168 hours. Coagulation Profile: Recent Labs  Lab 02/18/23 0137  INR 1.1    Cardiac Enzymes: No results for input(s): "CKTOTAL", "CKMB", "CKMBINDEX", "TROPONINI" in the last 168 hours. BNP (last 3 results) No results for input(s): "PROBNP" in the last 8760 hours. HbA1C: No results for input(s): "  HGBA1C" in the last 72 hours. CBG: No results for input(s): "GLUCAP" in the last 168 hours. Lipid Profile: No results for input(s): "CHOL", "HDL", "LDLCALC", "TRIG", "CHOLHDL", "LDLDIRECT" in the last 72 hours. Thyroid Function Tests: No results for input(s): "TSH", "T4TOTAL", "FREET4", "T3FREE", "THYROIDAB" in the last 72 hours. Anemia Panel: No results for input(s): "VITAMINB12", "FOLATE", "FERRITIN", "TIBC", "IRON", "RETICCTPCT" in the last 72 hours. Sepsis Labs: No results for input(s): "PROCALCITON", "LATICACIDVEN" in the last 168 hours.  No results found for this or any previous visit (from the past 240 hour(s)).   Radiology Studies: DG FEMUR, MIN 2 VIEWS RIGHT  Result Date: 02/19/2023 CLINICAL DATA:  Known intratrochanteric fracture on the right EXAM: RIGHT FEMUR 2 VIEWS COMPARISON:  02/18/2023 FLUOROSCOPY TIME:  Radiation Exposure Index (as  provided by the fluoroscopic device): 23.56 mGy If the device does not provide the exposure index: Fluoroscopy Time:  1 minutes 31 seconds Number of Acquired Images:  6 FINDINGS: Initial images again demonstrate the intratrochanteric fracture somewhat reduced. Medullary rod was then placed in the femur with proximal the distal fixation screws. Fracture fragments are in near anatomic alignment. IMPRESSION: ORIF of right proximal femoral fracture Electronically Signed   By: Inez Catalina M.D.   On: 02/19/2023 10:44   DG C-Arm 1-60 Min-No Report  Result Date: 02/19/2023 Fluoroscopy was utilized by the requesting physician.  No radiographic interpretation.   DG C-Arm 1-60 Min-No Report  Result Date: 02/19/2023 Fluoroscopy was utilized by the requesting physician.  No radiographic interpretation.   DG Knee 1-2 Views Right  Result Date: 02/19/2023 CLINICAL DATA:  Right hip fracture. EXAM: RIGHT KNEE - 1-2 VIEW COMPARISON:  None Available. FINDINGS: A small calcification is seen adjacent to the medial femoral condyle, suggesting Pellegrini-Stieda lesion. No dislocation. Mild degenerative changes are present in the patellofemoral compartment. There is a trace suprapatellar joint effusion. Vascular calcifications are present in the soft tissues. IMPRESSION: 1. Small calcification adjacent to the medial femoral condyle, compatible with Pellegrini-Stieda lesion. 2. Mild degenerative changes in the patellofemoral compartment. 3. Mild suprapatellar joint effusion. Electronically Signed   By: Brett Fairy M.D.   On: 02/19/2023 00:02   CT HEAD WO CONTRAST (5MM)  Result Date: 02/18/2023 CLINICAL DATA:  Follow-up after fall EXAM: CT HEAD WITHOUT CONTRAST TECHNIQUE: Contiguous axial images were obtained from the base of the skull through the vertex without intravenous contrast. RADIATION DOSE REDUCTION: This exam was performed according to the departmental dose-optimization program which includes automated exposure  control, adjustment of the mA and/or kV according to patient size and/or use of iterative reconstruction technique. COMPARISON:  05/12/2022 FINDINGS: Brain: No evidence of acute infarction, hemorrhage, hydrocephalus, extra-axial collection or mass lesion/mass effect. Chronic right occipital infarct. Generalized cerebral volume loss. Vascular: No hyperdense vessel or unexpected calcification. Skull: Normal. Negative for fracture or focal lesion. Sinuses/Orbits: No acute finding. IMPRESSION: 1. No evidence of injury. 2. Chronic right occipital infarct. Electronically Signed   By: Jorje Guild M.D.   On: 02/18/2023 08:24   DG Chest 1 View  Result Date: 02/18/2023 CLINICAL DATA:  Right hip fracture.  Fall. EXAM: CHEST  1 VIEW COMPARISON:  09/27/2021 FINDINGS: Heart and mediastinal contours are within normal limits. No focal opacities or effusions. No acute bony abnormality. IMPRESSION: No active disease. Electronically Signed   By: Rolm Baptise M.D.   On: 02/18/2023 01:38   DG HIP UNILAT WITH PELVIS 2-3 VIEWS RIGHT  Result Date: 02/18/2023 CLINICAL DATA:  Fall out of bed, right hip pain  EXAM: DG HIP (WITH OR WITHOUT PELVIS) 2-3V RIGHT COMPARISON:  None Available. FINDINGS: There is a comminuted right femoral intertrochanteric fracture with displaced fracture fragments and varus angulation. No subluxation or dislocation. IMPRESSION: Angulated displaced right femoral intertrochanteric fracture. Electronically Signed   By: Rolm Baptise M.D.   On: 02/18/2023 01:37    Scheduled Meds:  chlorhexidine       cyanocobalamin  500 mcg Oral Daily   levothyroxine  88 mcg Oral Q0600   pantoprazole  40 mg Oral Daily   rosuvastatin  5 mg Oral Q M,W,F   Continuous Infusions:  sodium chloride Stopped (02/18/23 1236)     LOS: 1 day   Darliss Cheney, MD [facility]  02/19/2023, 11:49 AM   *Please note that this is a verbal dictation therefore any spelling or grammatical errors are due to the "[employer]" system interpretation.  Please page via [place] and do not message via secure chat for urgent patient care matters. Secure chat can be used for non urgent patient care matters.  How to contact the [facility] Attending or Consulting provider [place] or covering provider during after hours [place], for this patient?  Check the care team in [facility] and look for a) attending/consulting [facility] provider listed and b) the [facility] team listed. Page or secure chat [phone]. Log into [email or web address] and use [place] universal password to access. If you do not have the password, please contact the hospital operator. Locate the [facility] provider you are looking for under [facility] and page to a number that you can be directly reached. If you still have difficulty reaching the provider, please page the [facility] (Director on Call) for the Hospitalists listed on amion for assistance.

## 2023-02-19 NOTE — Anesthesia Postprocedure Evaluation (Signed)
Anesthesia Post Note  Patient: Tasha Avila  Procedure(s) Performed: INTRAMEDULLARY (IM) NAIL, HIP (Right)     Patient location during evaluation: PACU Anesthesia Type: General Level of consciousness: awake and alert Pain management: pain level controlled Vital Signs Assessment: post-procedure vital signs reviewed and stable Respiratory status: spontaneous breathing, nonlabored ventilation, respiratory function stable and patient connected to nasal cannula oxygen Cardiovascular status: blood pressure returned to baseline and stable Postop Assessment: no apparent nausea or vomiting Anesthetic complications: no  No notable events documented.  Last Vitals:  Vitals:   02/19/23 1100 02/19/23 1115  BP: (!) 126/24 (!) 123/110  Pulse: 69 68  Resp: 15 17  Temp:  36.8 C  SpO2: 97% 96%    Last Pain:  Vitals:   02/19/23 0727  TempSrc: Oral  PainSc:                  Sulo Janczak,W. EDMOND

## 2023-02-19 NOTE — Anesthesia Procedure Notes (Signed)
Procedure Name: Intubation Date/Time: 02/19/2023 9:07 AM  Performed by: Clearnce Sorrel, CRNAPre-anesthesia Checklist: Patient identified, Emergency Drugs available, Suction available and Patient being monitored Patient Re-evaluated:Patient Re-evaluated prior to induction Oxygen Delivery Method: Circle System Utilized Preoxygenation: Pre-oxygenation with 100% oxygen Induction Type: IV induction Ventilation: Mask ventilation without difficulty Laryngoscope Size: Mac and 3 Grade View: Grade I Tube type: Oral Tube size: 7.0 mm Number of attempts: 1 Airway Equipment and Method: Stylet and Oral airway Placement Confirmation: ETT inserted through vocal cords under direct vision, positive ETCO2 and breath sounds checked- equal and bilateral Secured at: 21 cm Tube secured with: Tape Dental Injury: Teeth and Oropharynx as per pre-operative assessment

## 2023-02-19 NOTE — Evaluation (Signed)
Clinical/Bedside Swallow Evaluation Patient Details  Name: Carlinda Tape MRN: [id number] Date of Birth: June 20, 1928  Today's Date: 02/19/2023 Time: SLP Start Time (ACUTE ONLY): 54 SLP Stop Time (ACUTE ONLY): 1425 SLP Time Calculation (min) (ACUTE ONLY): 12 min  Past Medical History:  Past Medical History:  Diagnosis Date   Anemia    Arthritis    GERD (gastroesophageal reflux disease)    Hypercholesterolemia    Hypertension    Hypothyroidism    Past Surgical History:  Past Surgical History:  Procedure Laterality Date   ABDOMINAL HYSTERECTOMY  1960s   bladder tack at the same time, ovaries not removed   LAPAROSCOPIC CHOLECYSTECTOMY  5/11   HPI:  Ronan Nelli Larin is a 89 y.o. female with medical history significant of dementia, hypertension, hyperlipidemia, GERD, hypothyroidism, depression, anemia, history of non-Hodgkin's lymphoma, CKD stage II-IIIa presented to [place] ED from [facility] living for evaluation of right hip pain after a mechanical fall. X-ray of right hip/pelvis showing angulated displaced right femoral intertrochanteric fracture.  Chest x-ray showing no active disease. Underwent intramedullary nailing of right hip earlier today. No previous swallow notes.    Assessment / Plan / Recommendation  Clinical Impression  Pt seen for swallow assessment following surgical repair of hip fracture earlier today with a functional oropharyngeal swallow. She is significanlty hard of hearing and do not feel she heard question related to prior swallow function. She has the majority of her dentition missing several posterior upper and lower. Vocal quality is clear with fairly strong cough. Pt consumed straw sips thin without s/s aspiration with single and sequential sips. Minimal lingual residue with regular and pt requested liquid wash to successfully clear. Aspiration risk appears low and recommend she continue regular texture, thin liquids, pills with thin,  sit upright and small sips/thin. No further ST needed. SLP Visit Diagnosis: Dysphagia, unspecified (R13.10)    Aspiration Risk  Mild aspiration risk    Diet Recommendation Regular;Thin liquid   Liquid Administration via: Cup;Straw Medication Administration: Whole meds with liquid Supervision: Patient able to self feed Compensations: Slow rate;Small sips/bites Postural Changes: Seated upright at 90 degrees    Other  Recommendations Oral Care Recommendations: Oral care BID    Recommendations for follow up therapy are one component of a multi-disciplinary discharge planning process, led by the attending physician.  Recommendations may be updated based on patient status, additional functional criteria and insurance authorization.  Follow up Recommendations No SLP follow up      Assistance Recommended at Discharge    Functional Status Assessment Patient has not had a recent decline in their functional status  Frequency and Duration            Prognosis        Swallow Study   General Date of Onset: 02/18/23 HPI: Ethelwyn Buol is a 89 y.o. female with medical history significant of dementia, hypertension, hyperlipidemia, GERD, hypothyroidism, depression, anemia, history of non-Hodgkin's lymphoma, CKD stage II-IIIa presented to [facility] ED from [facility] living for evaluation of right hip pain after a mechanical fall. X-ray of right hip/pelvis showing angulated displaced right femoral intertrochanteric fracture.  Chest x-ray showing no active disease. Underwent intramedullary nailing of right hip earlier today. No previous swallow notes. Type of Study: Bedside Swallow Evaluation Previous Swallow Assessment:  (none) Diet Prior to this Study: Regular;Thin liquids (Level 0) Temperature Spikes Noted: No Respiratory Status: Nasal cannula History of Recent Intubation: Yes Total duration of intubation (days):  (during  surgery today) Date extubated:  02/19/23 Behavior/Cognition: Alert;Cooperative;Pleasant mood (significantly HOH) Oral Cavity Assessment: Dry Oral Care Completed by SLP: No Oral Cavity - Dentition: Missing dentition (missing several posterior) Vision: Functional for self-feeding Self-Feeding Abilities: Needs assist Patient Positioning: Upright in bed Baseline Vocal Quality: Normal Volitional Cough: Strong    Oral/Motor/Sensory Function Overall Oral Motor/Sensory Function: Within functional limits   Ice Chips Ice chips: Not tested   Thin Liquid Thin Liquid: Within functional limits    Nectar Thick Nectar Thick Liquid: Not tested   Honey Thick Honey Thick Liquid: Not tested   Puree Puree: Within functional limits   Solid     Solid: Impaired Oral Phase Functional Implications: Oral residue (minimal lingual residue- pt requesting liquid wash) Pharyngeal Phase Impairments:  (none)      [place], Orbie Pyo 02/19/2023,2:53 PM

## 2023-02-19 NOTE — Transfer of Care (Signed)
Immediate Anesthesia Transfer of Care Note  Patient: Tasha Avila  Procedure(s) Performed: INTRAMEDULLARY (IM) NAIL, HIP (Right)  Patient Location: PACU  Anesthesia Type:General and Spinal  Level of Consciousness: drowsy  Airway & Oxygen Therapy: Patient Spontanous Breathing and Patient connected to face mask oxygen  Post-op Assessment: Report given to RN and Post -op Vital signs reviewed and stable  Post vital signs: Reviewed and stable  Last Vitals:  Vitals Value Taken Time  BP 136/88 02/19/23 1045  Temp    Pulse 69 02/19/23 1047  Resp 14 02/19/23 1047  SpO2 93 % 02/19/23 1047  Vitals shown include unvalidated device data.  Last Pain:  Vitals:   02/19/23 0727  TempSrc: Oral  PainSc:       Patients Stated Pain Goal: 0 (0000000 [id number])  Complications: No notable events documented.

## 2023-02-20 ENCOUNTER — Inpatient Hospital Stay (HOSPITAL_COMMUNITY): Payer: Medicare Other

## 2023-02-20 DIAGNOSIS — S72001A Fracture of unspecified part of neck of right femur, initial encounter for closed fracture: Secondary | ICD-10-CM | POA: Diagnosis not present

## 2023-02-20 LAB — CBC WITH DIFFERENTIAL/PLATELET
Abs Immature Granulocytes: 0.08 10*3/uL — ABNORMAL HIGH (ref 0.00–0.07)
Basophils Absolute: 0.1 10*3/uL (ref 0.0–0.1)
Basophils Relative: 0 %
Eosinophils Absolute: 0 10*3/uL (ref 0.0–0.5)
Eosinophils Relative: 0 %
HCT: 21 % — ABNORMAL LOW (ref 36.0–46.0)
Hemoglobin: 7.4 g/dL — ABNORMAL LOW (ref 12.0–15.0)
Immature Granulocytes: 1 %
Lymphocytes Relative: 8 %
Lymphs Abs: 1.2 10*3/uL (ref 0.7–4.0)
MCH: 32.3 pg (ref 26.0–34.0)
MCHC: 35.2 g/dL (ref 30.0–36.0)
MCV: 91.7 fL (ref 80.0–100.0)
Monocytes Absolute: 2.5 10*3/uL — ABNORMAL HIGH (ref 0.1–1.0)
Monocytes Relative: 17 %
Neutro Abs: 11 10*3/uL — ABNORMAL HIGH (ref 1.7–7.7)
Neutrophils Relative %: 74 %
Platelets: 286 10*3/uL (ref 150–400)
RBC: 2.29 MIL/uL — ABNORMAL LOW (ref 3.87–5.11)
RDW: 13 % (ref 11.5–15.5)
WBC: 14.9 10*3/uL — ABNORMAL HIGH (ref 4.0–10.5)
nRBC: 0 % (ref 0.0–0.2)

## 2023-02-20 LAB — BASIC METABOLIC PANEL
Anion gap: 6 (ref 5–15)
BUN: 16 mg/dL (ref 8–23)
CO2: 25 mmol/L (ref 22–32)
Calcium: 7.9 mg/dL — ABNORMAL LOW (ref 8.9–10.3)
Chloride: 97 mmol/L — ABNORMAL LOW (ref 98–111)
Creatinine, Ser: 0.78 mg/dL (ref 0.44–1.00)
GFR, Estimated: >60 mL/min (ref >60)
Glucose, Bld: 144 mg/dL — ABNORMAL HIGH (ref 70–99)
Potassium: 4.3 mmol/L (ref 3.5–5.1)
Sodium: 128 mmol/L — ABNORMAL LOW (ref 135–145)

## 2023-02-20 LAB — HEMOGLOBIN AND HEMATOCRIT, BLOOD
HCT: 20.3 % — ABNORMAL LOW (ref 36.0–46.0)
Hemoglobin: 6.5 g/dL — CL (ref 12.0–15.0)

## 2023-02-20 LAB — PREPARE RBC (CROSSMATCH)

## 2023-02-20 LAB — VITAMIN D 25 HYDROXY (VIT D DEFICIENCY, FRACTURES): Vit D, 25-Hydroxy: 43.23 ng/mL (ref 30–100)

## 2023-02-20 MED ORDER — SODIUM CHLORIDE 0.9% IV SOLUTION: Freq: Once | INTRAVENOUS | Status: AC

## 2023-02-20 NOTE — Evaluation (Signed)
Physical Therapy Evaluation Patient Details Name: Tasha Avila MRN: [id number] DOB: 12/31/1927 Today's Date: 02/20/2023  History of Present Illness  89 y.o. female presented to [facility] ED from [facility] living s/p mechanical fall. Transferred to [facility] 3/17 and underwent IM Nail for R hip fracture. PMH: dementia, hypertension, hyperlipidemia, GERD, hypothyroidism, depression, anemia, history of non-Hodgkin's lymphoma, CKD stage II-IIIa  Clinical Impression  Patient is s/p above surgery resulting in functional limitations due to the deficits listed below (see PT Problem List). Total-Max assist +2 for bed mobility and transfer out of bed. Pain reported in back, RLE, and bil knees. Pt pleasant and agreeable to therapy. Assisted to stand with max A +2 using RW, followed by stedy for transfer to recliner. Patient will benefit from skilled PT to increase their independence and safety with mobility to allow discharge to the venue listed below.            Recommendations for follow up therapy are one component of a multi-disciplinary discharge planning process, led by the attending physician.  Recommendations may be updated based on patient status, additional functional criteria and insurance authorization.  Follow Up Recommendations Skilled nursing-short term rehab (<3 hours/day) Can patient physically be transported by private vehicle: No    Assistance Recommended at Discharge Frequent or constant Supervision/Assistance  Patient can return home with the following  Two people to help with walking and/or transfers;Two people to help with bathing/dressing/bathroom;Assistance with cooking/housework;Assist for transportation;Help with stairs or ramp for entrance    Equipment Recommendations None recommended by PT  Recommendations for Other Services       Functional Status Assessment Patient has had a recent decline in their functional status and demonstrates the ability to  make significant improvements in function in a reasonable and predictable amount of time.     Precautions / Restrictions Precautions Precautions: Fall;Other (comment) (desats on RA) Restrictions Weight Bearing Restrictions: Yes RLE Weight Bearing: Weight bearing as tolerated      Mobility  Bed Mobility Overal bed mobility: Needs Assistance Bed Mobility: Supine to Sit     Supine to sit: Total assist, +2 for physical assistance, HOB elevated     General bed mobility comments: +2 total assist. required use of bed pad; movement significantly limited by her resisting movement apparently due to hip pain. Leaning posteriorly. Severe back pain sitting.    Transfers Overall transfer level: Needs assistance Equipment used: Rolling walker (2 wheels), Ambulation equipment used Transfers: Sit to/from Stand, Bed to chair/wheelchair/BSC Sit to Stand: Max assist, +2 physical assistance, From elevated surface           General transfer comment: Performed initially with RW, +2 max assist for boost to stand, pt with urinary incontince, stood with max support. Cues for hand placement and technique. Performed again from bed with Stedy max A +2, limited WB tolerance through RLE but does well with knees blocked. Paddles down to support during transfer. Transfer via Lift Equipment: Stedy  Ambulation/Gait                  Stairs            Wheelchair Mobility    Modified Rankin (Stroke Patients Only)       Balance Overall balance assessment: History of Falls, Needs assistance Sitting-balance support: Feet supported Sitting balance-Leahy Scale: Fair Sitting balance - Comments: Sat several minutes at EOB, progressed to unsupported sitting.   Standing balance support: Bilateral upper extremity supported, Reliant on assistive device for  balance, During functional activity Standing balance-Leahy Scale: Zero                               Pertinent Vitals/Pain Pain  Assessment Pain Assessment: Faces Faces Pain Scale: Hurts even more Pain Location: R hip Pain Descriptors / Indicators: Aching, Discomfort, Grimacing, Guarding, Moaning Pain Intervention(s): Limited activity within patient's tolerance, Monitored during session, Repositioned, Ice applied    Home Living Family/patient expects to be discharged to:: Skilled nursing facility                        Prior Function Prior Level of Function : Needs assist;Patient poor historian/Family not available               ADLs Comments: Lives at [place]; recently moved to the memory care unit     Hand Dominance   Dominant Hand: Right    Extremity/Trunk Assessment   Upper Extremity Assessment Upper Extremity Assessment: Defer to OT evaluation    Lower Extremity Assessment Lower Extremity Assessment: RLE deficits/detail;Difficult to assess due to impaired cognition RLE Deficits / Details: RLE weakness and guarding as expected post op.    Cervical / Trunk Assessment Cervical / Trunk Assessment: Kyphotic  Communication   Communication: HOH (extremely; can read)  Cognition Arousal/Alertness: Awake/alert Behavior During Therapy: WFL for tasks assessed/performed Overall Cognitive Status: No family/caregiver present to determine baseline cognitive functioning                                 General Comments: Dementia at baseline; oriented to self only; poor STM; unaware she has a broken hip        General Comments General comments (skin integrity, edema, etc.): Severe back pain sitting. BIL knee pain standing. SpO2 87-95% on 2L during therapy session.    Exercises     Assessment/Plan    PT Assessment Patient needs continued PT services  PT Problem List Decreased strength;Decreased range of motion;Decreased activity tolerance;Decreased balance;Decreased mobility;Decreased cognition;Decreased knowledge of use of DME;Decreased safety awareness;Decreased knowledge  of precautions;Cardiopulmonary status limiting activity;Obesity;Pain       PT Treatment Interventions DME instruction;Gait training;Functional mobility training;Therapeutic activities;Therapeutic exercise;Balance training;Neuromuscular re-education;Cognitive remediation;Patient/family education;Wheelchair mobility training;Modalities    PT Goals (Current goals can be found in the Care Plan section)  Acute Rehab PT Goals Patient Stated Goal: none staetd PT Goal Formulation: Patient unable to participate in goal setting Time For Goal Achievement: 03/06/23 Potential to Achieve Goals: Fair    Frequency Min 3X/week     Co-evaluation PT/OT/SLP Co-Evaluation/Treatment: Yes Reason for Co-Treatment: Necessary to address cognition/behavior during functional activity;For patient/therapist safety;To address functional/ADL transfers PT goals addressed during session: Mobility/safety with mobility;Balance;Proper use of DME OT goals addressed during session: ADL's and self-care       AM-PAC PT "6 Clicks" Mobility  Outcome Measure Help needed turning from your back to your side while in a flat bed without using bedrails?: Total Help needed moving from lying on your back to sitting on the side of a flat bed without using bedrails?: Total Help needed moving to and from a bed to a chair (including a wheelchair)?: Total Help needed standing up from a chair using your arms (e.g., wheelchair or bedside chair)?: Total Help needed to walk in hospital room?: Total Help needed climbing 3-5 steps with a railing? : Total 6 Click Score:  6    End of Session Equipment Utilized During Treatment: Gait belt;Oxygen Activity Tolerance: Patient limited by pain Patient left: in chair;with call bell/phone within reach;with chair alarm set;with SCD's reapplied Nurse Communication:  (OT spoke with RN) PT Visit Diagnosis: Unsteadiness on feet (R26.81);Other abnormalities of gait and mobility (R26.89);Muscle weakness  (generalized) (M62.81);History of falling (Z91.81);Difficulty in walking, not elsewhere classified (R26.2);Pain Pain - Right/Left: Right Pain - part of body: Hip (back and BIL knees)    Time: 1031-1100 PT Time Calculation (min) (ACUTE ONLY): 29 min   Charges:   PT Evaluation $PT Eval Moderate Complexity: [place], PT, DPT Physical Therapist Acute Rehabilitation Services [facility]   Ellouise Newer 02/20/2023, 12:27 PM

## 2023-02-20 NOTE — Care Management Important Message (Signed)
Important Message  Patient Details  Name: Tasha Avila MRN: [id number] Date of Birth: 1928/08/17   Medicare Important Message Given:  Yes     Hannah Beat 02/20/2023, 12:00 PM
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
## 2023-02-20 NOTE — Progress Notes (Signed)
PROGRESS NOTE    Tasha Avila  [id number] DOB: 1928-08-20 DOA: 02/18/2023 PCP: Tasha Pheasant, MD   Brief Narrative:  HPI: Tasha Avila is a 89 y.o. female with medical history significant of dementia, hypertension, hyperlipidemia, GERD, hypothyroidism, depression, anemia, history of non-Hodgkin's lymphoma, CKD stage II-IIIa presented to [place] ED from [facility] living for evaluation of right hip pain after a mechanical fall.  In the ED, vital signs stable.  Labs significant for WBC 17.0, hemoglobin 11.5 (previously 12-14 range), MCV 90.9, sodium 124, chloride 93, glucose 174, calcium 8.5.  X-ray of right hip/pelvis showing angulated displaced right femoral intertrochanteric fracture.  Chest x-ray showing no active disease.  EKG showing sinus rhythm and no acute ischemic changes. ED physician consulted Dr. Mable Avila with orthopedics who recommended admission at [facility].  Patient received morphine and Zofran in the ED.   Patient resting in the bed comfortably, smiling.  She is extremely hard of hearing and is not able to give any history.  Daughter Tasha Avila at bedside states patient has dementia and recently moved into the memory care unit at [facility] on March 1st.  She was informed that the patient had a fall at her facility tonight and was found on the floor.  When staff tried to help her stand up, she could not put any weight on her legs.  She complained of pain in her right leg.  Unknown whether she hit her head or lost consciousness.  Daughter states patient otherwise has been in her usual state of health and recently had regular checkups by her PCP and cardiologist.  She is not on anticoagulation or antiplatelet agents.  Daughter is not aware of patient having any vomiting or diarrhea.  She does report patient having history of frequent UTIs.  Assessment & Plan:   Principal Problem:   Closed right hip fracture (HCC) Active Problems:   Essential  hypertension   Non Hodgkin's lymphoma (HCC)   Depression   Normocytic anemia   Hyponatremia   Leukocytosis   Fall   Chronic kidney disease, stage II (mild)   Dementia ([place])  Angulated displaced right femoral intertrochanteric fracture Secondary to a mechanical fall.  Underwent INTRAMEDULLARY NAILING OF RIGHT FOUR PART INTERTROCHANTERIC HIP on 02/19/2023.  Pain fairly controlled.  Management per orthopedics.  Acute hyponatremia Possibly medication induced as she is on Cymbalta. ?Poor po intake.  Not on diuretics. Sodium 124 upon presentation but improved to 128 again today after receiving some IV fluids.  Continue to hold Cymbalta but will stop IV fluids now.   Leukocytosis WBC 17.0 upon admission, improving, no fever or signs of sepsis.  Chest x-ray not suggestive of pneumonia.  UA pending.     Unwitnessed fall Unknown whether patient hit her head or lost consciousness.  Awake and alert at this time and appears comfortable.  She is fully alert and moving all extremities spontaneously.  CT head shows chronic right occipital infarct but nothing acute.   Normocytic anemia/acute blood loss anemia/acute postoperative anemia Hemoglobin 11.5, previously in the 12-14 range.  Type and screen, continue to monitor CBC.  Hemoglobin further dropped to 7.4 postoperatively.  Will recheck around 5 pm and transfuse if less than 7.   CKD stage II-IIIa Renal function stable, continue to monitor.   Dementia Delirium precautions.   History of Non-Hodgkin's lymphoma Outpatient oncology follow-up.   Depression Hold Cymbalta given hyponatremia.   Hypertension: Blood pressure controlled.  Continue to hold lisinopril.  Hyperlipidemia: Continue Crestor.  Hypothyroidism: TSH normal on 10/13/2022.  Continue Synthroid.  GERD: Continue with PPI.   DVT prophylaxis: enoxaparin (LOVENOX) injection 40 mg Start: 02/20/23 0800 SCDs Start: 02/19/23 1156 SCDs Start: 02/18/23 [id number]   Code Status: DNR  Family  Communication:  None present at bedside.   Status is: Inpatient Remains inpatient appropriate because: Patient just went orthopedic surgery.   Estimated body mass index is 28.13 kg/m as calculated from the following:   Height as of 02/12/23: 5\' 2"  (1.575 m).   Weight as of 02/12/23: 69.8 kg.    Nutritional Assessment: There is no height or weight on file to calculate BMI.. Seen by dietician.  I agree with the assessment and plan as outlined below: Nutrition Status: Nutrition Problem: Increased nutrient needs Etiology: hip fracture Signs/Symptoms: estimated needs Interventions: Refer to RD note for recommendations  . Skin Assessment: I have examined the patient's skin and I agree with the wound assessment as performed by the wound care RN as outlined below:    Consultants:  Orthopedics  Procedures:  As above  Antimicrobials:  Anti-infectives (From admission, onward)    Start     Dose/Rate Route Frequency Ordered Stop   02/19/23 1500  ceFAZolin (ANCEF) IVPB 2g/100 mL premix        2 g 200 mL/hr over 30 Minutes Intravenous Every 6 hours 02/19/23 1155 02/20/23 0802         Subjective: Patient seen and examined.  She is fully alert but not oriented due to her dementia.  She was complaining of dry mouth.  No other complaint.  Objective: Vitals:   02/19/23 2002 02/20/23 0011 02/20/23 0415 02/20/23 0809  BP: (!) 136/41 (!) 131/46 (!) 112/52 139/87  Pulse: 83 82 83 86  Resp: 18 16 18 17   Temp: 98.2 F (36.8 C) 97.8 F (36.6 C) 98.2 F (36.8 C) 98.1 F (36.7 C)  TempSrc:    Oral  SpO2: 94% 95% 92% 95%    Intake/Output Summary (Last 24 hours) at 02/20/2023 0951 Last data filed at 02/19/2023 1039 Gross per 24 hour  Intake 1350 ml  Output 25 ml  Net 1325 ml    There were no vitals filed for this visit.  Examination:  General exam: Appears calm and comfortable  Respiratory system: Clear to auscultation. Respiratory effort normal. Cardiovascular system: S1 &  S2 heard, RRR. No JVD, murmurs, rubs, gallops or clicks. No pedal edema. Gastrointestinal system: Abdomen is nondistended, soft and nontender. No organomegaly or masses felt. Normal bowel sounds heard. Central nervous system: Alert but not oriented.  No focal deficits. Skin: No rashes, lesions or ulcers.    Data Reviewed: I have personally reviewed following labs and imaging studies  CBC: Recent Labs  Lab 02/18/23 0137 02/18/23 0637 02/19/23 0430 02/20/23 0144  WBC 17.0* 16.5* 15.8* 14.9*  NEUTROABS 14.4*  --  12.3* 11.0*  HGB 11.5* 10.2* 9.7* 7.4*  HCT 33.9* 30.2* 28.1* 21.0*  MCV 90.9 91.8 92.1 91.7  PLT 343 316 335 [id number]    Basic Metabolic Panel: Recent Labs  Lab 02/18/23 0137 02/18/23 0637 02/18/23 1202 02/18/23 2019 02/19/23 0430 02/20/23 0144  NA 124* 127* 128* 129* 128* 128*  K 4.6 4.7 4.9  --  4.8 4.3  CL 93* 95* 96*  --  97* 97*  CO2 25 25 22   --  24 25  GLUCOSE 174* 158* 128*  --  148* 144*  BUN 14 14 15   --  18 16  CREATININE 0.78 1.06* 1.16*  --  1.04* 0.78  CALCIUM 8.5* 8.5* 8.2*  --  8.2* 7.9*    GFR: Estimated Creatinine Clearance: 41.1 mL/min (by C-G formula based on SCr of 0.78 mg/dL). Liver Function Tests: No results for input(s): "AST", "ALT", "ALKPHOS", "BILITOT", "PROT", "ALBUMIN" in the last 168 hours. No results for input(s): "LIPASE", "AMYLASE" in the last 168 hours. No results for input(s): "AMMONIA" in the last 168 hours. Coagulation Profile: Recent Labs  Lab 02/18/23 0137  INR 1.1    Cardiac Enzymes: No results for input(s): "CKTOTAL", "CKMB", "CKMBINDEX", "TROPONINI" in the last 168 hours. BNP (last 3 results) No results for input(s): "PROBNP" in the last 8760 hours. HbA1C: No results for input(s): "HGBA1C" in the last 72 hours. CBG: No results for input(s): "GLUCAP" in the last 168 hours. Lipid Profile: No results for input(s): "CHOL", "HDL", "LDLCALC", "TRIG", "CHOLHDL", "LDLDIRECT" in the last 72 hours. Thyroid Function  Tests: No results for input(s): "TSH", "T4TOTAL", "FREET4", "T3FREE", "THYROIDAB" in the last 72 hours. Anemia Panel: No results for input(s): "VITAMINB12", "FOLATE", "FERRITIN", "TIBC", "IRON", "RETICCTPCT" in the last 72 hours. Sepsis Labs: No results for input(s): "PROCALCITON", "LATICACIDVEN" in the last 168 hours.  Recent Results (from the past 240 hour(s))  Surgical pcr screen     Status: Abnormal   Collection Time: 02/19/23  7:25 AM   Specimen: Nasal Mucosa; Nasal Swab  Result Value Ref Range Status   MRSA, PCR NEGATIVE NEGATIVE Final   Staphylococcus aureus POSITIVE (A) NEGATIVE Final    Comment: (NOTE) The Xpert SA Assay (FDA approved for NASAL specimens in patients 25 years of age and older), is one component of a comprehensive surveillance program. It is not intended to diagnose infection nor to guide or monitor treatment. Performed at [facility], [place] [address], [place], [place] [address]      Radiology Studies: DG FEMUR PORT, MIN 2 VIEWS RIGHT  Result Date: 02/19/2023 CLINICAL DATA:  Closed right hip fracture.  Postoperative. EXAM: RIGHT FEMUR PORTABLE 2 VIEW COMPARISON:  Intraoperative fluoroscopy right femur 02/19/2023, pelvis and right hip radiographs and right knee radiographs 02/18/2023 FINDINGS: There is diffuse decreased bone mineralization. Interval long cephalomedullary nail fixation of the previously seen proximal right femoral intertrochanteric fracture. Single distal transverse interlocking screw. Unchanged moderate medial displacement of the lesser trochanter. There is near anatomic alignment of the dominant fixated femoral fracture components. Severe patellofemoral joint space narrowing. Small knee joint effusion. Expected postoperative subcutaneous air within the lateral proximal right thigh. Hernia mesh coils overlie the pelvis. IMPRESSION: Interval long cephalomedullary nail fixation of the previously seen proximal right femoral intertrochanteric  fracture. Electronically Signed   By: Yvonne Kendall M.D.   On: 02/19/2023 13:46   DG FEMUR, MIN 2 VIEWS RIGHT  Result Date: 02/19/2023 CLINICAL DATA:  Known intratrochanteric fracture on the right EXAM: RIGHT FEMUR 2 VIEWS COMPARISON:  02/18/2023 FLUOROSCOPY TIME:  Radiation Exposure Index (as provided by the fluoroscopic device): 23.56 mGy If the device does not provide the exposure index: Fluoroscopy Time:  1 minutes 31 seconds Number of Acquired Images:  6 FINDINGS: Initial images again demonstrate the intratrochanteric fracture somewhat reduced. Medullary rod was then placed in the femur with proximal the distal fixation screws. Fracture fragments are in near anatomic alignment. IMPRESSION: ORIF of right proximal femoral fracture Electronically Signed   By: Inez Catalina M.D.   On: 02/19/2023 10:44   DG C-Arm 1-60 Min-No Report  Result Date: 02/19/2023 Fluoroscopy was utilized by the requesting physician.  No radiographic interpretation.  DG C-Arm 1-60 Min-No Report  Result Date: 02/19/2023 Fluoroscopy was utilized by the requesting physician.  No radiographic interpretation.   DG Knee 1-2 Views Right  Result Date: 02/19/2023 CLINICAL DATA:  Right hip fracture. EXAM: RIGHT KNEE - 1-2 VIEW COMPARISON:  None Available. FINDINGS: A small calcification is seen adjacent to the medial femoral condyle, suggesting Pellegrini-Stieda lesion. No dislocation. Mild degenerative changes are present in the patellofemoral compartment. There is a trace suprapatellar joint effusion. Vascular calcifications are present in the soft tissues. IMPRESSION: 1. Small calcification adjacent to the medial femoral condyle, compatible with Pellegrini-Stieda lesion. 2. Mild degenerative changes in the patellofemoral compartment. 3. Mild suprapatellar joint effusion. Electronically Signed   By: Brett Fairy M.D.   On: 02/19/2023 00:02    Scheduled Meds:  acetaminophen  650 mg Oral Q8H   cyanocobalamin  500 mcg Oral Daily    docusate sodium  100 mg Oral BID   enoxaparin (LOVENOX) injection  40 mg Subcutaneous Q24H   levothyroxine  88 mcg Oral Q0600   mupirocin ointment  1 Application Nasal BID   nystatin   Topical TID   pantoprazole  40 mg Oral Daily   rosuvastatin  5 mg Oral Q M,W,F   Continuous Infusions:  sodium chloride 125 mL/hr at 02/20/23 0943     LOS: 2 days   Darliss Cheney, MD [facility]  02/20/2023, 9:51 AM   *Please note that this is a verbal dictation therefore any spelling or grammatical errors are due to the "[place]" system interpretation.  Please page via [place] and do not message via secure chat for urgent patient care matters. Secure chat can be used for non urgent patient care matters.  How to contact the [facility] Attending or Consulting provider [place] or covering provider during after hours [place], for this patient?  Check the care team in [facility] and look for a) attending/consulting TRH provider listed and b) the [facility] team listed. Page or secure chat [phone]. Log into [email or web address] and use [place]'s universal password to access. If you do not have the password, please contact the hospital operator. Locate the [facility] provider you are looking for under [facility] and page to a number that you can be directly reached. If you still have difficulty reaching the provider, please page the [facility] (Director on Call) for the Hospitalists listed on amion for assistance.
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
## 2023-02-20 NOTE — Progress Notes (Signed)
RE:  Tasha Avila       Date of Birth:  2028-10-29     Date:   02/20/23       To Whom It May Concern:  Please be advised that the above-named patient will require a short-term nursing home stay - anticipated 30 days or less for rehabilitation and strengthening.  The plan is for return home.                 MD signature                Date

## 2023-02-20 NOTE — Progress Notes (Signed)
[facility] Progress Note  Patient ID: Alenia Stinar MRN: [id number] DOB/AGE: September 09, 1928 89 y.o.  Subjective:  No acute issues Daughter at bedside Recently moved into memory care unit  Uses cane at baseline but probably really needed to be using walker   Worked with therapy but was a max assist +2 for sit to stand and a +2 total assist for bed mobility  Review of Systems  Unable to perform ROS: Dementia    Objective:   VITALS:   Vitals:   02/20/23 0415 02/20/23 0809 02/20/23 1029 02/20/23 1030  BP: (!) 112/52 139/87    Pulse: 83 86    Resp: 18 17    Temp: 98.2 F (36.8 C) 98.1 F (36.7 C)    TempSrc:  Oral    SpO2: 92% 95% (!) 81% 93%    Estimated body mass index is 28.13 kg/m as calculated from the following:   Height as of 02/12/23: 5\' 2"  (1.575 m).   Weight as of 02/12/23: 69.8 kg.   Intake/Output      03/18 0701 03/19 0700 03/19 0701 03/20 0700   P.O.  480   I.V. 2100 43.3   IV Piggyback 450 100   Total Intake 2550 623.3   Blood 25    Total Output 25    Net +2525 +623.3        Urine Occurrence 1 x    Emesis Occurrence 1 x      LABS  Results for orders placed or performed during the hospital encounter of 02/18/23 (from the past 24 hour(s))  Basic metabolic panel     Status: Abnormal   Collection Time: 02/20/23  1:44 AM  Result Value Ref Range   Sodium 128 (L) 135 - 145 mmol/L   Potassium 4.3 3.5 - 5.1 mmol/L   Chloride 97 (L) 98 - 111 mmol/L   CO2 25 22 - 32 mmol/L   Glucose, Bld 144 (H) 70 - 99 mg/dL   BUN 16 8 - 23 mg/dL   Creatinine, Ser 0.78 0.44 - 1.00 mg/dL   Calcium 7.9 (L) 8.9 - 10.3 mg/dL   GFR, Estimated >60 >60 mL/min   Anion gap 6 5 - 15  VITAMIN D 25 Hydroxy (Vit-D Deficiency, Fractures)     Status: None   Collection Time: 02/20/23  1:44 AM  Result Value Ref Range   Vit D, 25-Hydroxy 43.23 30 - 100 ng/mL  CBC with  Differential/Platelet     Status: Abnormal   Collection Time: 02/20/23  1:44 AM  Result Value Ref Range   WBC 14.9 (H) 4.0 - 10.5 K/uL   RBC 2.29 (L) 3.87 - 5.11 MIL/uL   Hemoglobin 7.4 (L) 12.0 - 15.0 g/dL   HCT 21.0 (L) 36.0 - 46.0 %   MCV 91.7 80.0 - 100.0 fL   MCH 32.3 26.0 - 34.0 pg   MCHC 35.2 30.0 - 36.0 g/dL   RDW 13.0 11.5 - 15.5 %   Platelets 286 150 - 400 K/uL   nRBC 0.0 0.0 - 0.2 %   Neutrophils Relative % 74 %   Neutro Abs 11.0 (H) 1.7 - 7.7 K/uL   Lymphocytes Relative 8 %   Lymphs Abs 1.2 0.7 - 4.0 K/uL   Monocytes Relative 17 %   Monocytes Absolute 2.5 (H) 0.1 -  1.0 K/uL   Eosinophils Relative 0 %   Eosinophils Absolute 0.0 0.0 - 0.5 K/uL   Basophils Relative 0 %   Basophils Absolute 0.1 0.0 - 0.1 K/uL   Immature Granulocytes 1 %   Abs Immature Granulocytes 0.08 (H) 0.00 - 0.07 K/uL     PHYSICAL EXAM:   Gen: Sitting up in bed, no acute distress.  Awake.  Not talking all that much but appears comfortable Lungs: unlabored Ext:       Right Lower Extremity              Dressings in place, clean, dry and intact              Ext warm              Mild swelling             Thigh is soft             No DCT              Compartments are soft             Distal motor and sensory functions appear to be grossly intact             + DP pulse    Assessment/Plan: 1 Day Post-Op   Principal Problem:   Closed right hip fracture (HCC) Active Problems:   Essential hypertension   Non Hodgkin's lymphoma (HCC)   Depression   Normocytic anemia   Hyponatremia   Leukocytosis   Fall   Chronic kidney disease, stage II (mild)   Dementia (HCC)   Anti-infectives (From admission, onward)    Start     Dose/Rate Route Frequency Ordered Stop   02/19/23 1500  ceFAZolin (ANCEF) IVPB 2g/100 mL premix        2 g 200 mL/hr over 30 Minutes Intravenous Every 6 hours 02/19/23 1155 02/20/23 0802     .  POD/HD#: 51  89 year old female fall with right intertrochanteric femur  fracture  - fall  -Post right intertrochanteric femur fracture s/p Intramedullary nailing  Weightbearing As tolerated right leg with assistance   ROM/Activity   Unrestricted range of motion of right hip and knee   Activity as tolerated as noted above   Wound care   Daily wound care starting tomorrow   PT and OT evaluations  TOC consult for SNF   Ice and elevation for swelling and pain control  - Pain management:  Multimodal, minimize narcotics - ABL anemia/Hemodynamics  CBC pending for this afternoon anticipate she will need a transfusion  No underlying cardiac disease other than hypertension  - Medical issues   Per primary - DVT/PE prophylaxis:  SCDs and Lovenox  Will do Lovenox for 21 days postop - ID:   Perioperative antibiotics - Metabolic Bone Disease:  Vitamin D levels look good however hip fracture and mechanism are suggestive of osteoporosis  Optimize nutrition  Suspect bone quality is poor due to low activity level  - Activity:  Above  - Impediments to fracture healing:  Age  Bone quality - Dispo:  Ortho issues stable  Continue with therapies  Follow-up on CBC this evening  TOC consult for SNF   Discussed with daughter going to visit 2-3 facilities to make a preference list  Likely ready for discharge Thursday or Friday      Jari Pigg, PA-C [phone] (C) 02/20/2023, 2:10 PM  [facility] [place] [address]  [phone] (O) [phone] (F)    After 5pm and on the weekends please log on to Amion, go to orthopaedics and the look under the [facility] Call for the provider(s) on call. You can also call our office at [phone] and then follow the prompts to be connected to the call team.  Patient ID: [name], female   DOB: 07/12/1928, 89 y.o.   MRN: [id number]

## 2023-02-20 NOTE — Evaluation (Signed)
Occupational Therapy Evaluation Patient Details Name: Tasha Avila MRN: [id number] DOB: 05-09-1928 Today's Date: 02/20/2023   History of Present Illness 89 y.o. female presented to [facility] ED from [facility] living s/p mechanical fall. Transferred to [facility] and underwent IM Nail for R hip fracture. PMH: dementia, hypertension, hyperlipidemia, GERD, hypothyroidism, depression, anemia, history of non-Hodgkin's lymphoma, CKD stage II-IIIa   Clinical Impression   PTA Tasha Avila lives at [facility] in the memory care unit.  Appears that she is a functional ambulator and has assistance for ADL by staff, however unable to give PLOF. Tasha Avila was able to progress to EOB then  to chair with use of the [facility] and Max A +2. Pt overall Max to total A with LB ADL tasks due to below listed deficits and will benefit from post acute rehab at SNF to maximize functional level of independence. Acute OT to follow. Per nsg, Pt desats to high 70s on RA. VSS during session on RA.      Recommendations for follow up therapy are one component of a multi-disciplinary discharge planning process, led by the attending physician.  Recommendations may be updated based on patient status, additional functional criteria and insurance authorization.   Follow Up Recommendations  Skilled nursing-short term rehab (<3 hours/day)     Assistance Recommended at Discharge Frequent or constant Supervision/Assistance  Patient can return home with the following Two people to help with walking and/or transfers;Two people to help with bathing/dressing/bathroom;Assistance with cooking/housework;Direct supervision/assist for medications management;Direct supervision/assist for financial management;Assist for transportation;Help with stairs or ramp for entrance    Functional Status Assessment  Patient has had a recent decline in their functional status and demonstrates the ability to make significant  improvements in function in a reasonable and predictable amount of time.  Equipment Recommendations  None recommended by OT    Recommendations for Other Services       Precautions / Restrictions Precautions Precautions: Fall;Other (comment) (desats on RA)      Mobility Bed Mobility Overal bed mobility: Needs Assistance Bed Mobility: Supine to Sit     Supine to sit: Total assist, +2 for physical assistance     General bed mobility comments: required use of bed pad; movement significantly limited by her resisting movement apparently due to hip pain    Transfers Overall transfer level: Needs assistance   Transfers: Sit to/from Stand, Bed to chair/wheelchair/BSC Sit to Stand: Max assist, +2 physical assistance, From elevated surface             Transfer via Lift Equipment: Stedy    Balance Overall balance assessment: History of Falls, Needs assistance   Sitting balance-Leahy Scale: Fair       Standing balance-Leahy Scale: Zero                             ADL either performed or assessed with clinical judgement   ADL Overall ADL's : Needs assistance/impaired Eating/Feeding: Set up   Grooming: Set up;Supervision/safety   Upper Body Bathing: Set up;Supervision/ safety;Sitting   Lower Body Bathing: Maximal assistance;Sit to/from stand   Upper Body Dressing : Moderate assistance   Lower Body Dressing: Total assistance   Toilet Transfer: +2 for physical assistance;Maximal assistance (Stedy used; pt helped to pull up on bar)   Toileting- Water quality scientist and Hygiene: Total assistance Toileting - Clothing Manipulation Details (indicate cue type and reason): incontinent     Functional mobility during ADLs:  Maximal assistance;+2 for physical assistance Charlaine Dalton)       Vision Patient Visual Report:  (per pt does not wear glasses; abl eto read "white board")       Perception     Praxis      Pertinent Vitals/Pain Pain Assessment Pain  Assessment: Faces Faces Pain Scale: Hurts even more Pain Location: R hip Pain Descriptors / Indicators: Aching, Discomfort, Grimacing, Guarding, Moaning Pain Intervention(s): Limited activity within patient's tolerance, Repositioned, Ice applied, Premedicated before session     Hand Dominance Right   Extremity/Trunk Assessment Upper Extremity Assessment Upper Extremity Assessment: Generalized weakness (limited B shoulder ROM)   Lower Extremity Assessment Lower Extremity Assessment: Defer to PT evaluation   Cervical / Trunk Assessment Cervical / Trunk Assessment: Kyphotic   Communication Communication Communication: HOH (extremely; can read)   Cognition Arousal/Alertness: Awake/alert Behavior During Therapy: WFL for tasks assessed/performed Overall Cognitive Status: No family/caregiver present to determine baseline cognitive functioning                                 General Comments: Dementia at baseline; oriented to self only; poor STM; unaware she has a broken hip     General Comments  multiple bruises; complaining of back pain and B knee pain    Exercises     Shoulder Instructions      Home Living Family/patient expects to be discharged to:: Skilled nursing facility                                        Prior Functioning/Environment Prior Level of Function : Needs assist;Patient poor historian/Family not available               ADLs Comments: Lives at [facility]; recently moved to the memory care unit        OT Problem List: Decreased strength;Decreased range of motion;Decreased activity tolerance;Impaired balance (sitting and/or standing);Decreased cognition;Decreased safety awareness;Decreased knowledge of use of DME or AE;Decreased knowledge of precautions;Cardiopulmonary status limiting activity;Obesity;Pain      OT Treatment/Interventions: Self-care/ADL training;Therapeutic exercise;DME and/or AE instruction;Therapeutic  activities;Cognitive remediation/compensation;Patient/family education;Balance training    OT Goals(Current goals can be found in the care plan section) Acute Rehab OT Goals Patient Stated Goal: none stated OT Goal Formulation: Patient unable to participate in goal setting Time For Goal Achievement: 03/06/23 Potential to Achieve Goals: Good  OT Frequency: Min 2X/week    Co-evaluation PT/OT/SLP Co-Evaluation/Treatment: Yes Reason for Co-Treatment: Necessary to address cognition/behavior during functional activity;For patient/therapist safety;To address functional/ADL transfers   OT goals addressed during session: ADL's and self-care      AM-PAC OT "6 Clicks" Daily Activity     Outcome Measure Help from another person eating meals?: A Little Help from another person taking care of personal grooming?: A Little Help from another person toileting, which includes using toliet, bedpan, or urinal?: Total Help from another person bathing (including washing, rinsing, drying)?: A Lot Help from another person to put on and taking off regular upper body clothing?: A Lot Help from another person to put on and taking off regular lower body clothing?: Total 6 Click Score: 12   End of Session Equipment Utilized During Treatment: Gait belt;Oxygen (2L) Nurse Communication: Mobility status;Need for lift equipment;Weight bearing status;Precautions (Stedy)  Activity Tolerance: Patient tolerated treatment well Patient left: in chair;with call bell/phone within reach;with  chair alarm set  OT Visit Diagnosis: Unsteadiness on feet (R26.81);Other abnormalities of gait and mobility (R26.89);Muscle weakness (generalized) (M62.81);History of falling (Z91.81);Other symptoms and signs involving cognitive function;Pain Pain - Right/Left: Right Pain - part of body: Hip (back)                Time: 1032-1100 OT Time Calculation (min): 28 min Charges:  OT General Charges $OT Visit: 1 Visit OT Evaluation $OT Eval  Moderate Complexity: [place], OT/L   Acute OT Clinical Specialist [place] Pager [phone] Office [phone]   [employer] [employer] 02/20/2023, 11:44 AM

## 2023-02-20 NOTE — Progress Notes (Signed)
Pt on 2L oxygen.  RN attempted to discontinue and pt SpO2 81% on room air at rest.  Pt placed back on 2L [place] oxygen - SpO2 93% at rest.

## 2023-02-21 ENCOUNTER — Encounter (HOSPITAL_COMMUNITY): Payer: Self-pay | Admitting: Orthopedic Surgery

## 2023-02-21 ENCOUNTER — Inpatient Hospital Stay (HOSPITAL_COMMUNITY): Payer: Medicare Other

## 2023-02-21 DIAGNOSIS — N1831 Chronic kidney disease, stage 3a: Secondary | ICD-10-CM | POA: Diagnosis not present

## 2023-02-21 DIAGNOSIS — I499 Cardiac arrhythmia, unspecified: Secondary | ICD-10-CM | POA: Diagnosis not present

## 2023-02-21 DIAGNOSIS — M6281 Muscle weakness (generalized): Secondary | ICD-10-CM | POA: Diagnosis not present

## 2023-02-21 DIAGNOSIS — I129 Hypertensive chronic kidney disease with stage 1 through stage 4 chronic kidney disease, or unspecified chronic kidney disease: Secondary | ICD-10-CM | POA: Diagnosis not present

## 2023-02-21 DIAGNOSIS — S8991XA Unspecified injury of right lower leg, initial encounter: Secondary | ICD-10-CM | POA: Diagnosis present

## 2023-02-21 DIAGNOSIS — R2681 Unsteadiness on feet: Secondary | ICD-10-CM | POA: Diagnosis not present

## 2023-02-21 DIAGNOSIS — X58XXXA Exposure to other specified factors, initial encounter: Secondary | ICD-10-CM | POA: Diagnosis not present

## 2023-02-21 DIAGNOSIS — E785 Hyperlipidemia, unspecified: Secondary | ICD-10-CM | POA: Diagnosis not present

## 2023-02-21 DIAGNOSIS — K219 Gastro-esophageal reflux disease without esophagitis: Secondary | ICD-10-CM | POA: Diagnosis not present

## 2023-02-21 DIAGNOSIS — R404 Transient alteration of awareness: Secondary | ICD-10-CM | POA: Diagnosis not present

## 2023-02-21 DIAGNOSIS — F0390 Unspecified dementia without behavioral disturbance: Secondary | ICD-10-CM | POA: Diagnosis not present

## 2023-02-21 DIAGNOSIS — R1312 Dysphagia, oropharyngeal phase: Secondary | ICD-10-CM | POA: Diagnosis not present

## 2023-02-21 DIAGNOSIS — I469 Cardiac arrest, cause unspecified: Secondary | ICD-10-CM | POA: Diagnosis not present

## 2023-02-21 DIAGNOSIS — I10 Essential (primary) hypertension: Secondary | ICD-10-CM | POA: Diagnosis not present

## 2023-02-21 DIAGNOSIS — R079 Chest pain, unspecified: Secondary | ICD-10-CM | POA: Diagnosis not present

## 2023-02-21 DIAGNOSIS — S72001A Fracture of unspecified part of neck of right femur, initial encounter for closed fracture: Secondary | ICD-10-CM | POA: Diagnosis not present

## 2023-02-21 DIAGNOSIS — S72141D Displaced intertrochanteric fracture of right femur, subsequent encounter for closed fracture with routine healing: Secondary | ICD-10-CM | POA: Diagnosis not present

## 2023-02-21 DIAGNOSIS — I69891 Dysphagia following other cerebrovascular disease: Secondary | ICD-10-CM | POA: Diagnosis not present

## 2023-02-21 DIAGNOSIS — R531 Weakness: Secondary | ICD-10-CM | POA: Diagnosis not present

## 2023-02-21 DIAGNOSIS — I69828 Other speech and language deficits following other cerebrovascular disease: Secondary | ICD-10-CM | POA: Diagnosis not present

## 2023-02-21 DIAGNOSIS — R0789 Other chest pain: Secondary | ICD-10-CM | POA: Diagnosis not present

## 2023-02-21 DIAGNOSIS — E039 Hypothyroidism, unspecified: Secondary | ICD-10-CM | POA: Diagnosis not present

## 2023-02-21 DIAGNOSIS — R41841 Cognitive communication deficit: Secondary | ICD-10-CM | POA: Diagnosis not present

## 2023-02-21 DIAGNOSIS — Z9181 History of falling: Secondary | ICD-10-CM | POA: Diagnosis not present

## 2023-02-21 DIAGNOSIS — S8011XA Contusion of right lower leg, initial encounter: Secondary | ICD-10-CM | POA: Diagnosis not present

## 2023-02-21 DIAGNOSIS — Z79899 Other long term (current) drug therapy: Secondary | ICD-10-CM | POA: Diagnosis not present

## 2023-02-21 DIAGNOSIS — Z7401 Bed confinement status: Secondary | ICD-10-CM | POA: Diagnosis not present

## 2023-02-21 LAB — BASIC METABOLIC PANEL
Anion gap: 5 (ref 5–15)
BUN: 20 mg/dL (ref 8–23)
CO2: 25 mmol/L (ref 22–32)
Calcium: 7.8 mg/dL — ABNORMAL LOW (ref 8.9–10.3)
Chloride: 93 mmol/L — ABNORMAL LOW (ref 98–111)
Creatinine, Ser: 0.72 mg/dL (ref 0.44–1.00)
GFR, Estimated: >60 mL/min (ref >60)
Glucose, Bld: 136 mg/dL — ABNORMAL HIGH (ref 70–99)
Potassium: 4.7 mmol/L (ref 3.5–5.1)
Sodium: 123 mmol/L — ABNORMAL LOW (ref 135–145)

## 2023-02-21 LAB — CBC
HCT: 24.8 % — ABNORMAL LOW (ref 36.0–46.0)
Hemoglobin: 8.4 g/dL — ABNORMAL LOW (ref 12.0–15.0)
MCH: 31.1 pg (ref 26.0–34.0)
MCHC: 33.9 g/dL (ref 30.0–36.0)
MCV: 91.9 fL (ref 80.0–100.0)
Platelets: 296 10*3/uL (ref 150–400)
RBC: 2.7 MIL/uL — ABNORMAL LOW (ref 3.87–5.11)
RDW: 12.8 % (ref 11.5–15.5)
WBC: 17.2 10*3/uL — ABNORMAL HIGH (ref 4.0–10.5)
nRBC: 0 % (ref 0.0–0.2)

## 2023-02-21 LAB — URINALYSIS, COMPLETE (UACMP) WITH MICROSCOPIC
Bilirubin Urine: NEGATIVE
Glucose, UA: NEGATIVE mg/dL
Hgb urine dipstick: NEGATIVE
Ketones, ur: NEGATIVE mg/dL
Leukocytes,Ua: NEGATIVE
Nitrite: NEGATIVE
Protein, ur: NEGATIVE mg/dL
Specific Gravity, Urine: 1.025 (ref 1.005–1.030)
pH: 6 (ref 5.0–8.0)

## 2023-02-21 LAB — TRANSFUSION REACTION
DAT C3: NEGATIVE
Post RXN DAT IgG: NEGATIVE

## 2023-02-21 LAB — TYPE AND SCREEN
ABO/RH(D): A NEG
Antibody Screen: NEGATIVE
Unit division: 0

## 2023-02-21 LAB — BPAM RBC
Blood Product Expiration Date: 202404142359
ISSUE DATE / TIME: 202403192124
Unit Type and Rh: 600

## 2023-02-21 LAB — SODIUM: Sodium: 125 mmol/L — ABNORMAL LOW (ref 135–145)

## 2023-02-21 MED ORDER — ALUM & MAG HYDROXIDE-SIMETH 200-200-20 MG/5ML PO SUSP
15.0000 mL | Freq: Once | ORAL | Status: AC
  Administered 2023-02-21: 15 mL via ORAL
  Filled 2023-02-21: qty 30

## 2023-02-21 MED ORDER — ENOXAPARIN SODIUM 40 MG/0.4ML IJ SOSY: 40.0000 mg | PREFILLED_SYRINGE | INTRAMUSCULAR | 0 refills | Status: DC

## 2023-02-21 MED ORDER — OXYCODONE HCL 5 MG PO TABS: 5.0000 mg | ORAL_TABLET | Freq: Three times a day (TID) | ORAL | 0 refills | Status: DC | PRN

## 2023-02-21 NOTE — Discharge Summary (Signed)
Physician Discharge Summary  Tasha Avila [id number] DOB: 1928-03-14 DOA: 02/18/2023  PCP: Einar Pheasant, MD  Admit date: 02/18/2023 Discharge date: 02/21/2023 30 Day Unplanned Readmission Risk Score    Flowsheet Row ED to Hosp-Admission (Current) from 02/18/2023 in [place]  30 Day Unplanned Readmission Risk Score (%) 18.7 Filed at 02/21/2023 0801       This score is the patient's risk of an unplanned readmission within 30 days of being discharged (0 -100%). The score is based on dignosis, age, lab data, medications, orders, and past utilization.   Low:  0-14.9   Medium: 15-21.9   High: 22-29.9   Extreme: 30 and above          Admitted From: Home Disposition: SNF  Recommendations for Outpatient Follow-up:  Follow up with PCP in 1-2 weeks Please obtain BMP/CBC in one week Follow-up with orthopedics/Dr. Marcelino Scot in 2 weeks as a scheduled Please follow up with your PCP on the following pending results: Unresulted Labs (From admission, onward)     Start     Ordered   02/26/23 0500  Creatinine, serum  (enoxaparin (LOVENOX)  CrCl >/= 30 mL/min  )  Weekly,   R     Comments: while on enoxaparin therapy.   Question:  Specimen collection method  Answer:  Lab=Lab collect   02/19/23 1155   08-Mar-2023 [id number]  Basic metabolic panel  Tomorrow morning,   R       Question:  Specimen collection method  Answer:  Lab=Lab collect   02/21/23 0809   2023-03-08 0500  CBC with Differential/Platelet  Tomorrow morning,   R       Question:  Specimen collection method  Answer:  Lab=Lab collect   02/21/23 0809   02/21/23 0810  Sodium  Now then every 8 hours,   R (with TIMED occurrences)     Question:  Specimen collection method  Answer:  Lab=Lab collect   02/21/23 0809   02/18/23 0616  Urinalysis, Routine w reflex microscopic -Urine, Clean Catch  Once,   R       Question:  Specimen Source  Answer:  Urine, Clean Catch   02/18/23 0615               Home Health: None Equipment/Devices: None  Discharge Condition: Stable CODE STATUS: DNR Diet recommendation: Cardiac  Subjective: Seen and examined.  No complaints.  Alert and oriented to self only at baseline.  Brief/Interim Summary: Tasha Avila is a 89 y.o. female with medical history significant of dementia, hypertension, hyperlipidemia, GERD, hypothyroidism, depression, anemia, history of non-Hodgkin's lymphoma, CKD stage II-IIIa presented to [facility] ED from [facility] living for evaluation of right hip pain after a mechanical fall.  In the ED, vital signs stable.  Admitted under hospital service.  Details below.   Angulated displaced right femoral intertrochanteric fracture secondary to unwitnessed fall. Secondary to a mechanical fall.  Underwent INTRAMEDULLARY NAILING OF RIGHT FOUR PART INTERTROCHANTERIC HIP on 02/19/2023.  Pain fairly controlled.  Cleared by orthopedics for discharge with Lovenox for 21 days.  Follow-up with them in 2 weeks.  Seen by PT OT and they recommend SNF.  She is being discharged in stable condition today.   Acute hyponatremia Possibly medication induced as she is on Cymbalta. ?Poor po intake.  Not on diuretics. Sodium 124 upon presentation but improved to 128.   Leukocytosis WBC 17.0 upon admission, improving, no fever or signs of sepsis.  Chest  x-ray not suggestive of pneumonia.  UA pending.  But I doubt any infection since she is afebrile.   Normocytic anemia/acute blood loss anemia/acute postoperative anemia Hemoglobin 11.5, previously in the 12-14 range.  Type and screen, continue to monitor CBC.  Hemoglobin further dropped to 7.4 postoperatively.  Will recheck around 5 pm and transfuse if less than 7.   CKD stage II-IIIa Renal function stable, continue to monitor.   Dementia Delirium precautions.   History of Non-Hodgkin's lymphoma Outpatient oncology follow-up.   Depression Resume home medications.    Hypertension: Blood pressure controlled.  Resume lisinopril.   Hyperlipidemia: Continue Crestor.   Hypothyroidism: TSH normal on 10/13/2022.  Continue Synthroid.   GERD: Continue with PPI.  Discharge plan was discussed with patient and/or family member and they verbalized understanding and agreed with it.  Discharge Diagnoses:  Principal Problem:   Closed right hip fracture ([place]) Active Problems:   Essential hypertension   Non Hodgkin's lymphoma (HCC)   Depression   Normocytic anemia   Hyponatremia   Leukocytosis   Fall   Chronic kidney disease, stage II (mild)   Dementia ([place])    Discharge Instructions   Allergies as of 02/21/2023   No Known Allergies      Medication List     TAKE these medications    acetaminophen 500 MG tablet Commonly known as: TYLENOL Take 500 mg by mouth every 6 (six) hours as needed.   [employer] LEG CRAMPS PAIN RELIEF PO Take by mouth as needed.   DULoxetine 60 MG capsule Commonly known as: CYMBALTA Take 1 capsule (60 mg total) by mouth daily.   enoxaparin 40 MG/0.4ML injection Commonly known as: LOVENOX Inject 0.4 mLs (40 mg total) into the skin daily for 21 days.   levothyroxine 88 MCG tablet Commonly known as: SYNTHROID Take 1 tablet by mouth once daily   lisinopril 40 MG tablet Commonly known as: ZESTRIL Take 1 tablet (40 mg total) by mouth daily.   oxyCODONE 5 MG immediate release tablet Commonly known as: Oxy IR/ROXICODONE Take 1 tablet (5 mg total) by mouth every 8 (eight) hours as needed for severe pain.   pantoprazole 40 MG tablet Commonly known as: PROTONIX Take 1 tablet (40 mg total) by mouth daily.   psyllium 58.6 % packet Commonly known as: METAMUCIL Take 1 packet by mouth daily.   rosuvastatin 5 MG tablet Commonly known as: CRESTOR TAKE 1 TABLET BY MOUTH ON MONDAY, WEDNESDAY AND FRIDAY   SALINE NASAL SPRAY NA Place into the nose as needed.   SYSTANE BALANCE OP Apply 1 drop to eye as needed.   Vitamin B  12 500 MCG Tabs Take 500 mcg by mouth daily.   Vitamin D3 125 MCG (5000 UT) capsule Generic drug: Cholecalciferol Take 1 capsule (5,000 Units total) by mouth daily.        Follow-up Information     Altamese [place], MD. Schedule an appointment as soon as possible for a visit in 2 week(s).   Specialty: Orthopedic Surgery Contact information: [place] [address] [phone]         Einar Pheasant, MD Follow up in 1 week(s).   Specialty: Internal Medicine Contact information: [address] [place] [address] [phone]                No Known Allergies  Consultations: Orthopedics   Procedures/Studies: DG Thoracic Spine 2 View  Result Date: 02/20/2023 CLINICAL DATA:  Back pain. EXAM: THORACIC SPINE 2  VIEWS COMPARISON:  None Available. FINDINGS: Normal alignment of the thoracic vertebral bodies. No acute bony findings. IMPRESSION: Normal alignment and no acute bony findings. Electronically Signed   By: Marijo Sanes M.D.   On: 02/20/2023 14:17   DG Lumbar Spine 2-3 Views  Result Date: 02/20/2023 CLINICAL DATA:  Back pain. EXAM: LUMBAR SPINE - 2-3 VIEW COMPARISON:  Radiographs 05/14/2018 FINDINGS: Stable significant left convex lumbar scoliosis and associated degenerative lumbar spondylosis. Remote L1 fracture with vertebral augmentation changes. No obvious acute bony abnormality. IMPRESSION: 1. Scoliosis and degenerative lumbar spondylosis. 2. Remote L1 fracture with vertebral augmentation changes. 3. No acute bony findings. Electronically Signed   By: Marijo Sanes M.D.   On: 02/20/2023 14:14   DG FEMUR PORT, MIN 2 VIEWS RIGHT  Result Date: 02/19/2023 CLINICAL DATA:  Closed right hip fracture.  Postoperative. EXAM: RIGHT FEMUR PORTABLE 2 VIEW COMPARISON:  Intraoperative fluoroscopy right femur 02/19/2023, pelvis and right hip radiographs and right knee radiographs 02/18/2023 FINDINGS: There is diffuse decreased bone  mineralization. Interval long cephalomedullary nail fixation of the previously seen proximal right femoral intertrochanteric fracture. Single distal transverse interlocking screw. Unchanged moderate medial displacement of the lesser trochanter. There is near anatomic alignment of the dominant fixated femoral fracture components. Severe patellofemoral joint space narrowing. Small knee joint effusion. Expected postoperative subcutaneous air within the lateral proximal right thigh. Hernia mesh coils overlie the pelvis. IMPRESSION: Interval long cephalomedullary nail fixation of the previously seen proximal right femoral intertrochanteric fracture. Electronically Signed   By: Yvonne Kendall M.D.   On: 02/19/2023 13:46   DG FEMUR, MIN 2 VIEWS RIGHT  Result Date: 02/19/2023 CLINICAL DATA:  Known intratrochanteric fracture on the right EXAM: RIGHT FEMUR 2 VIEWS COMPARISON:  02/18/2023 FLUOROSCOPY TIME:  Radiation Exposure Index (as provided by the fluoroscopic device): 23.56 mGy If the device does not provide the exposure index: Fluoroscopy Time:  1 minutes 31 seconds Number of Acquired Images:  6 FINDINGS: Initial images again demonstrate the intratrochanteric fracture somewhat reduced. Medullary rod was then placed in the femur with proximal the distal fixation screws. Fracture fragments are in near anatomic alignment. IMPRESSION: ORIF of right proximal femoral fracture Electronically Signed   By: Inez Catalina M.D.   On: 02/19/2023 10:44   DG C-Arm 1-60 Min-No Report  Result Date: 02/19/2023 Fluoroscopy was utilized by the requesting physician.  No radiographic interpretation.   DG C-Arm 1-60 Min-No Report  Result Date: 02/19/2023 Fluoroscopy was utilized by the requesting physician.  No radiographic interpretation.   DG Knee 1-2 Views Right  Result Date: 02/19/2023 CLINICAL DATA:  Right hip fracture. EXAM: RIGHT KNEE - 1-2 VIEW COMPARISON:  None Available. FINDINGS: A small calcification is seen adjacent  to the medial femoral condyle, suggesting Pellegrini-Stieda lesion. No dislocation. Mild degenerative changes are present in the patellofemoral compartment. There is a trace suprapatellar joint effusion. Vascular calcifications are present in the soft tissues. IMPRESSION: 1. Small calcification adjacent to the medial femoral condyle, compatible with Pellegrini-Stieda lesion. 2. Mild degenerative changes in the patellofemoral compartment. 3. Mild suprapatellar joint effusion. Electronically Signed   By: Brett Fairy M.D.   On: 02/19/2023 00:02   CT HEAD WO CONTRAST (5MM)  Result Date: 02/18/2023 CLINICAL DATA:  Follow-up after fall EXAM: CT HEAD WITHOUT CONTRAST TECHNIQUE: Contiguous axial images were obtained from the base of the skull through the vertex without intravenous contrast. RADIATION DOSE REDUCTION: This exam was performed according to the departmental dose-optimization program which includes automated exposure control, adjustment of  the mA and/or kV according to patient size and/or use of iterative reconstruction technique. COMPARISON:  05/12/2022 FINDINGS: Brain: No evidence of acute infarction, hemorrhage, hydrocephalus, extra-axial collection or mass lesion/mass effect. Chronic right occipital infarct. Generalized cerebral volume loss. Vascular: No hyperdense vessel or unexpected calcification. Skull: Normal. Negative for fracture or focal lesion. Sinuses/Orbits: No acute finding. IMPRESSION: 1. No evidence of injury. 2. Chronic right occipital infarct. Electronically Signed   By: Jorje Guild M.D.   On: 02/18/2023 08:24   DG Chest 1 View  Result Date: 02/18/2023 CLINICAL DATA:  Right hip fracture.  Fall. EXAM: CHEST  1 VIEW COMPARISON:  09/27/2021 FINDINGS: Heart and mediastinal contours are within normal limits. No focal opacities or effusions. No acute bony abnormality. IMPRESSION: No active disease. Electronically Signed   By: Rolm Baptise M.D.   On: 02/18/2023 01:38   DG HIP UNILAT  WITH PELVIS 2-3 VIEWS RIGHT  Result Date: 02/18/2023 CLINICAL DATA:  Fall out of bed, right hip pain EXAM: DG HIP (WITH OR WITHOUT PELVIS) 2-3V RIGHT COMPARISON:  None Available. FINDINGS: There is a comminuted right femoral intertrochanteric fracture with displaced fracture fragments and varus angulation. No subluxation or dislocation. IMPRESSION: Angulated displaced right femoral intertrochanteric fracture. Electronically Signed   By: Rolm Baptise M.D.   On: 02/18/2023 01:37     Discharge Exam: Vitals:   02/21/23 0405 02/21/23 0810  BP: (!) 148/72 (!) 123/52  Pulse: 81 61  Resp: 20 18  Temp: 98.6 F (37 C) 98.2 F (36.8 C)  SpO2: 95% 99%   Vitals:   02/21/23 0000 02/21/23 0127 02/21/23 0405 02/21/23 0810  BP:   (!) 148/72 (!) 123/52  Pulse: 78  81 61  Resp:   20 18  Temp:   98.6 F (37 C) 98.2 F (36.8 C)  TempSrc:   Axillary Oral  SpO2: 93%  95% 99%  Height:  5\' 2"  (1.575 m)      General: Pt is alert, awake, not in acute distress Cardiovascular: RRR, S1/S2 +, no rubs, no gallops Respiratory: CTA bilaterally, no wheezing, no rhonchi Abdominal: Soft, NT, ND, bowel sounds + Extremities: no edema, no cyanosis    The results of significant diagnostics from this hospitalization (including imaging, microbiology, ancillary and laboratory) are listed below for reference.     Microbiology: Recent Results (from the past 240 hour(s))  Surgical pcr screen     Status: Abnormal   Collection Time: 02/19/23  7:25 AM   Specimen: Nasal Mucosa; Nasal Swab  Result Value Ref Range Status   MRSA, PCR NEGATIVE NEGATIVE Final   Staphylococcus aureus POSITIVE (A) NEGATIVE Final    Comment: (NOTE) The Xpert SA Assay (FDA approved for NASAL specimens in patients 1 years of age and older), is one component of a comprehensive surveillance program. It is not intended to diagnose infection nor to guide or monitor treatment. Performed at [facility], [facility] [address] [place],  [place] [address]      Labs: BNP (last 3 results) No results for input(s): "BNP" in the last 8760 hours. Basic Metabolic Panel: Recent Labs  Lab 02/18/23 0637 02/18/23 1202 02/18/23 2019 02/19/23 0430 02/20/23 0144 02/21/23 0152  NA 127* 128* 129* 128* 128* 123*  K 4.7 4.9  --  4.8 4.3 4.7  CL 95* 96*  --  97* 97* 93*  CO2 25 22  --  24 25 25   GLUCOSE 158* 128*  --  148* 144* 136*  BUN 14 15  --  18 16 20   CREATININE 1.06* 1.16*  --  1.04* 0.78 0.72  CALCIUM 8.5* 8.2*  --  8.2* 7.9* 7.8*   Liver Function Tests: No results for input(s): "AST", "ALT", "ALKPHOS", "BILITOT", "PROT", "ALBUMIN" in the last 168 hours. No results for input(s): "LIPASE", "AMYLASE" in the last 168 hours. No results for input(s): "AMMONIA" in the last 168 hours. CBC: Recent Labs  Lab 02/18/23 0137 02/18/23 0637 02/19/23 0430 02/20/23 0144 02/20/23 1718 02/21/23 0152  WBC 17.0* 16.5* 15.8* 14.9*  --  17.2*  NEUTROABS 14.4*  --  12.3* 11.0*  --   --   HGB 11.5* 10.2* 9.7* 7.4* 6.5* 8.4*  HCT 33.9* 30.2* 28.1* 21.0* 20.3* 24.8*  MCV 90.9 91.8 92.1 91.7  --  91.9  PLT 343 316 335 286  --  296   Cardiac Enzymes: No results for input(s): "CKTOTAL", "CKMB", "CKMBINDEX", "TROPONINI" in the last 168 hours. BNP: Invalid input(s): "POCBNP" CBG: No results for input(s): "GLUCAP" in the last 168 hours. D-Dimer No results for input(s): "DDIMER" in the last 72 hours. Hgb A1c No results for input(s): "HGBA1C" in the last 72 hours. Lipid Profile No results for input(s): "CHOL", "HDL", "LDLCALC", "TRIG", "CHOLHDL", "LDLDIRECT" in the last 72 hours. Thyroid function studies No results for input(s): "TSH", "T4TOTAL", "T3FREE", "THYROIDAB" in the last 72 hours.  Invalid input(s): "FREET3" Anemia work up No results for input(s): "VITAMINB12", "FOLATE", "FERRITIN", "TIBC", "IRON", "RETICCTPCT" in the last 72 hours. Urinalysis    Component Value Date/Time   COLORURINE YELLOW 02/21/2023 0318   APPEARANCEUR  CLEAR 02/21/2023 0318   APPEARANCEUR Hazy 02/22/2014 1039   LABSPEC 1.025 02/21/2023 0318   LABSPEC 1.009 02/22/2014 1039   PHURINE 6.0 02/21/2023 0318   GLUCOSEU NEGATIVE 02/21/2023 0318   GLUCOSEU NEGATIVE 11/10/2021 1441   HGBUR NEGATIVE 02/21/2023 0318   BILIRUBINUR NEGATIVE 02/21/2023 0318   BILIRUBINUR Negative 02/22/2014 1039   KETONESUR NEGATIVE 02/21/2023 0318   PROTEINUR NEGATIVE 02/21/2023 0318   UROBILINOGEN 0.2 11/10/2021 1441   NITRITE NEGATIVE 02/21/2023 0318   LEUKOCYTESUR NEGATIVE 02/21/2023 0318   LEUKOCYTESUR 3+ 02/22/2014 1039   Sepsis Labs Recent Labs  Lab 02/18/23 0637 02/19/23 0430 02/20/23 0144 02/21/23 0152  WBC 16.5* 15.8* 14.9* 17.2*   Microbiology Recent Results (from the past 240 hour(s))  Surgical pcr screen     Status: Abnormal   Collection Time: 02/19/23  7:25 AM   Specimen: Nasal Mucosa; Nasal Swab  Result Value Ref Range Status   MRSA, PCR NEGATIVE NEGATIVE Final   Staphylococcus aureus POSITIVE (A) NEGATIVE Final    Comment: (NOTE) The Xpert SA Assay (FDA approved for NASAL specimens in patients 43 years of age and older), is one component of a comprehensive surveillance program. It is not intended to diagnose infection nor to guide or monitor treatment. Performed at [facility], [place] [address], [place], [place] [address]      Time coordinating discharge: Over 30 minutes  SIGNED:   Darliss Cheney, MD  [facility] 02/21/2023, 11:37 AM *Please note that this is a verbal dictation therefore any spelling or grammatical errors are due to the "[place] One" system interpretation. If 7PM-7AM, please contact night-coverage [email or web address]

## 2023-02-21 NOTE — Progress Notes (Signed)
Physical Therapy Treatment Patient Details Name: Tasha Avila MRN: [id number] DOB: 09/28/1928 Today's Date: 02/21/2023   History of Present Illness 89 y.o. female presented to [facility] ED from [facility] living s/p mechanical fall. Transferred to [facility] and underwent IM Nail for R hip fracture. PMH: dementia, hypertension, hyperlipidemia, GERD, hypothyroidism, depression, anemia, history of non-Hodgkin's lymphoma, CKD stage II-IIIa    PT Comments    Pt is slowly progressing towards goals. She was able to provide assistance for functional mobility this session but continues to require 2 person assist making her a total assist. Pt was able to increase standing time this session in the stedy prior to sitting for transfer with bil UE support. Due to pt current functional status, home set up and available assistance recommending skilled physical therapy services at a higher level of care and frequency on discharge from acute care hospital setting in order to decrease risk for falls, injury and re-hospitalization. Pt O2 sats dropped significantly with transfer with increased HR to compensate; RN was notified. Increased time to return to baseline.     Recommendations for follow up therapy are one component of a multi-disciplinary discharge planning process, led by the attending physician.  Recommendations may be updated based on patient status, additional functional criteria and insurance authorization.  Follow Up Recommendations  Skilled nursing-short term rehab (<3 hours/day) Can patient physically be transported by private vehicle: No   Assistance Recommended at Discharge Frequent or constant Supervision/Assistance  Patient can return home with the following Two people to help with walking and/or transfers;Two people to help with bathing/dressing/bathroom;Assistance with cooking/housework;Assist for transportation;Help with stairs or ramp for entrance   Equipment  Recommendations  None recommended by PT    Recommendations for Other Services       Precautions / Restrictions Precautions Precautions: Fall;Other (comment) Precaution Comments: desats on RA but is delayed. Watch vitals Restrictions Weight Bearing Restrictions: Yes RLE Weight Bearing: Weight bearing as tolerated     Mobility  Bed Mobility Overal bed mobility: Needs Assistance Bed Mobility: Supine to Sit     Supine to sit: HOB elevated, Mod assist, +2 for physical assistance, +2 for safety/equipment, Total assist     General bed mobility comments: Pt was Mod A for bil LE and trunk for getting to sitting EOB making her a total assist Patient Response: Cooperative, Anxious  Transfers Overall transfer level: Needs assistance Equipment used: Ambulation equipment used Transfers: Sit to/from Stand, Bed to chair/wheelchair/BSC Sit to Stand: +2 physical assistance, From elevated surface, Mod assist           General transfer comment: Pt did well with standing today. she was able to stand in the stedy for ~1 min Unable to progress LLE/LE due to pain used Stedy to get to chair Transfer via Lift Equipment: Stedy  Ambulation/Gait               General Gait Details: unable to progress gait today due to current functional status.        Balance Overall balance assessment: History of Falls, Needs assistance Sitting-balance support: Feet supported Sitting balance-Leahy Scale: Fair Sitting balance - Comments: Sat several minutes at EOB, progressed to unsupported sitting.   Standing balance support: Bilateral upper extremity supported, Reliant on assistive device for balance Standing balance-Leahy Scale: Poor Standing balance comment: Pt was able to stand with bil UE support in steady with physical therapy hands on gait belt.       Cognition Arousal/Alertness: Awake/alert Behavior During  Therapy: WFL for tasks assessed/performed Overall Cognitive Status: No  family/caregiver present to determine baseline cognitive functioning     General Comments: Dementia at baseline; oriented to self only; poor STM; unaware she has a broken hip           General Comments General comments (skin integrity, edema, etc.): Pt O2 sats dropped down to 73% with mobility HR said 30 bpm. Difficult to assess if this was correct, pleth line was not even but pt was less responsive. After sitting and reclining back pt was more alert. O2 sats up to 96% and Hr down to 68 bpm.      Pertinent Vitals/Pain Pain Assessment Pain Assessment: Faces Faces Pain Scale: Hurts even more Breathing: occasional labored breathing, short period of hyperventilation Negative Vocalization: occasional moan/groan, low speech, negative/disapproving quality Facial Expression: facial grimacing Body Language: relaxed Consolability: distracted or reassured by voice/touch PAINAD Score: 5 Pain Location: R hip Pain Descriptors / Indicators: Aching, Discomfort, Grimacing, Guarding, Moaning Pain Intervention(s): Monitored during session, Repositioned     PT Goals (current goals can now be found in the care plan section) Acute Rehab PT Goals Patient Stated Goal: none stated PT Goal Formulation: Patient unable to participate in goal setting Time For Goal Achievement: 03/06/23 Potential to Achieve Goals: Fair Progress towards PT goals: Progressing toward goals    Frequency    Min 3X/week      PT Plan Current plan remains appropriate       AM-PAC PT "6 Clicks" Mobility   Outcome Measure  Help needed turning from your back to your side while in a flat bed without using bedrails?: Total Help needed moving from lying on your back to sitting on the side of a flat bed without using bedrails?: Total Help needed moving to and from a bed to a chair (including a wheelchair)?: Total Help needed standing up from a chair using your arms (e.g., wheelchair or bedside chair)?: Total Help needed to  walk in hospital room?: Total Help needed climbing 3-5 steps with a railing? : Total 6 Click Score: 6    End of Session Equipment Utilized During Treatment: Gait belt;Oxygen Activity Tolerance: Patient tolerated treatment well Patient left: in chair;with call bell/phone within reach;with chair alarm set Nurse Communication: Mobility status PT Visit Diagnosis: Unsteadiness on feet (R26.81);Other abnormalities of gait and mobility (R26.89);Muscle weakness (generalized) (M62.81);History of falling (Z91.81);Difficulty in walking, not elsewhere classified (R26.2);Pain Pain - Right/Left: Right Pain - part of body: Hip     Time: 0940-1005 PT Time Calculation (min) (ACUTE ONLY): 25 min  Charges:  $Therapeutic Activity: 23-37 mins                     Tomma Rakers, DPT, CLT  Acute Rehabilitation Services Office: [phone] (Secure chat preferred)    Ander Purpura 02/21/2023, 3:08 PM

## 2023-02-21 NOTE — Progress Notes (Signed)
Gave report to [facility].

## 2023-02-21 NOTE — Plan of Care (Signed)

## 2023-02-21 NOTE — Progress Notes (Signed)
Orthopaedic Trauma Service Progress Note  Patient ID: Tasha Avila MRN: [id number] DOB/AGE: 01-26-1928 89 y.o.  Subjective:  No acute events overnight     Review of Systems  Unable to perform ROS: Dementia    Objective:   VITALS:   Vitals:   02/21/23 0000 02/21/23 0127 02/21/23 0405 02/21/23 0810  BP:   (!) 148/72 (!) 123/52  Pulse: 78  81 61  Resp:   20 18  Temp:   98.6 F (37 C) 98.2 F (36.8 C)  TempSrc:   Axillary Oral  SpO2: 93%  95% 99%  Height:  5\' 2"  (1.575 m)      Estimated body mass index is 28.13 kg/m as calculated from the following:   Height as of this encounter: 5\' 2"  (1.575 m).   Weight as of 02/12/23: 69.8 kg.   Intake/Output      03/19 0701 03/20 0700 03/20 0701 02/27/2023 0700   P.O. 780    I.V. 177.3    Blood 396.7    IV Piggyback 100    Total Intake 1454    Urine 285    Emesis/NG output 0    Blood     Total Output 285    Net +1169         Urine Occurrence 3 x    Emesis Occurrence 1 x      LABS  Results for orders placed or performed during the hospital encounter of 02/18/23 (from the past 24 hour(s))  Hemoglobin and hematocrit, blood     Status: Abnormal   Collection Time: 02/20/23  5:18 PM  Result Value Ref Range   Hemoglobin 6.5 (LL) 12.0 - 15.0 g/dL   HCT 20.3 (L) 36.0 - 46.0 %  Prepare RBC (crossmatch)     Status: None   Collection Time: 02/20/23  8:46 PM  Result Value Ref Range   Order Confirmation      ORDER PROCESSED BY BLOOD BANK Performed at [facility], [place] [address] [place], [place] [id number]   Basic metabolic panel     Status: Abnormal   Collection Time: 02/21/23  1:52 AM  Result Value Ref Range   Sodium 123 (L) 135 - 145 mmol/L   Potassium 4.7 3.5 - 5.1 mmol/L   Chloride 93 (L) 98 - 111 mmol/L   CO2 25 22 - 32 mmol/L   Glucose, Bld 136 (H) 70 - 99 mg/dL   BUN 20 8 - 23 mg/dL   Creatinine, Ser 0.72 0.44 - 1.00 mg/dL    Calcium 7.8 (L) 8.9 - 10.3 mg/dL   GFR, Estimated >60 >60 mL/min   Anion gap 5 5 - 15  Transfusion reaction     Status: None (Preliminary result)   Collection Time: 02/21/23  1:52 AM  Result Value Ref Range   Post RXN DAT IgG NEG    DAT C3      NEG Performed at [facility] [address] [place], [place] [address]    Path interp tx rxn PENDING   CBC     Status: Abnormal   Collection Time: 02/21/23  1:52 AM  Result Value Ref Range   WBC 17.2 (H) 4.0 - 10.5 K/uL   RBC 2.70 (L) 3.87 - 5.11 MIL/uL   Hemoglobin 8.4 (L) 12.0 - 15.0  g/dL   HCT 24.8 (L) 36.0 - 46.0 %   MCV 91.9 80.0 - 100.0 fL   MCH 31.1 26.0 - 34.0 pg   MCHC 33.9 30.0 - 36.0 g/dL   RDW 12.8 11.5 - 15.5 %   Platelets 296 150 - 400 K/uL   nRBC 0.0 0.0 - 0.2 %  Urinalysis, Complete w Microscopic -     Status: Abnormal   Collection Time: 02/21/23  3:18 AM  Result Value Ref Range   Color, Urine YELLOW YELLOW   APPearance CLEAR CLEAR   Specific Gravity, Urine 1.025 1.005 - 1.030   pH 6.0 5.0 - 8.0   Glucose, UA NEGATIVE NEGATIVE mg/dL   Hgb urine dipstick NEGATIVE NEGATIVE   Bilirubin Urine NEGATIVE NEGATIVE   Ketones, ur NEGATIVE NEGATIVE mg/dL   Protein, ur NEGATIVE NEGATIVE mg/dL   Nitrite NEGATIVE NEGATIVE   Leukocytes,Ua NEGATIVE NEGATIVE   Squamous Epithelial / HPF 0-5 0 - 5 /HPF   WBC, UA 0-5 0 - 5 WBC/hpf   RBC / HPF 0-5 0 - 5 RBC/hpf   Bacteria, UA RARE (A) NONE SEEN     PHYSICAL EXAM:   Gen: in bed, NAD.  Not talking all that much but appears comfortable Lungs: unlabored Ext:       Right Lower Extremity              Dressings in place, clean, dry and intact    Dressings removed to eval wounds   All surgical sites are clean    No erythema    No signs of infection              Ext warm              Mild swelling             Thigh is soft             No DCT              Compartments are soft             Distal motor and sensory functions appear to be grossly intact             + DP  pulse   Assessment/Plan: 2 Days Post-Op      Anti-infectives (From admission, onward)    Start     Dose/Rate Route Frequency Ordered Stop   02/19/23 1500  ceFAZolin (ANCEF) IVPB 2g/100 mL premix        2 g 200 mL/hr over 30 Minutes Intravenous Every 6 hours 02/19/23 1155 02/20/23 0802     .  POD/HD#: 3  89 year old female fall with right intertrochanteric femur fracture   - fall   -Post right intertrochanteric femur fracture s/p Intramedullary nailing  Weightbearing As tolerated right leg with assistance               ROM/Activity                         Unrestricted range of motion of right hip and knee                         Activity as tolerated as noted above               Wound care  dressings changed   Daily changes as needed   Ok to bathe and wash with soap and water                PT and OT evaluations             TOC consult for SNF               Ice and elevation for swelling and pain control   - Pain management:             Multimodal, minimize narcotics  - ABL anemia/Hemodynamics             transfused yesterday   Cbc this am looks good    - Medical issues              Per primary  - DVT/PE prophylaxis:             SCDs and Lovenox             Will do Lovenox for 21 days postop - ID:              Perioperative antibiotics - Metabolic Bone Disease:             Vitamin D levels look good however hip fracture and mechanism are suggestive of osteoporosis             Optimize nutrition             Suspect bone quality is poor due to low activity level   - Activity:             Above   - Impediments to fracture healing:             Age             Bone quality - Dispo:             Ortho issues stable             Continue with therapies             Follow-up on CBC this evening             TOC consult for SNF                         Discussed with daughter going to visit 2-3 facilities to make a preference list       Follow up with ortho in 10-14 days, sutures out at that time    Jari Pigg, PA-C [phone] (C) 02/21/2023, 9:33 AM  [facility] [place] [address] [phone] Jenetta [phone] (F)    After 5pm and on the weekends please log on to Amion, go to orthopaedics and the look under the [facility] Call for the provider(s) on call. You can also call our office at [phone] and then follow the prompts to be connected to the call team.  Patient ID: Tasha Avila, female   DOB: Apr 11, 1928, 89 y.o.   MRN: [id number]

## 2023-02-21 NOTE — TOC Progression Note (Signed)
Transition of Care [facility]) - Progression Note    Patient Details  Name: Tasha Avila MRN: [id number] Date of Birth: 04/18/1928  [facility] CM/SW Contact  Joanne Chars, LCSW Phone Number: 02/21/2023, 11:34 AM  Clinical Narrative:    PASSR additional info uploaded to [place] [facility].  CSW spoke with daughter [name] and discussed bed offers.  She would like to accept offer at [facility].  CSW confirmed with [facility]ki[facility] that they can accept pt today.  MD informed.    PASSR received: [id number]   Expected Discharge Plan: [place] Barriers to Discharge: Continued Medical Work up, SNF Pending bed offer  Expected Discharge Plan and Services In-house Referral: Clinical Social Work   Post Acute Care Choice: [place] Living arrangements for the past 2 months: [place] ([facility])                                       Social Determinants of Health (SDOH) Interventions SDOH Screenings   Food Insecurity: Unknown (02/18/2023)  Housing: Low Risk  (02/18/2023)  Transportation Needs: No Transportation Needs (02/18/2023)  Utilities: Not At Risk (02/18/2023)  Depression (PHQ2-9): High Risk (02/12/2023)  Financial Resource Strain: Low Risk  (08/18/2021)  Physical Activity: Unknown (08/08/2018)  Social Connections: Unknown (08/18/2021)  Stress: No Stress Concern Present (08/18/2021)  Tobacco Use: Low Risk  (02/21/2023)    Readmission Risk Interventions     No data to display

## 2023-02-21 NOTE — Progress Notes (Signed)
RE:  Tasha Avila       Date of Birth: 2028/11/12      Date:   02/21/23       To Whom It May Concern:  Please be advised that the above-named patient has a primary diagnosis of dementia which supersedes any psychiatric diagnosis.                 MD signature                Date

## 2023-02-21 NOTE — TOC Transition Note (Signed)
Transition of Care [facility]) - CM/SW Discharge Note   Patient Details  Name: Tasha Avila MRN: [id number] Date of Birth: 1927/12/14  Transition of Care [facility]) CM/SW Contact:  Joanne Chars, LCSW Phone Number: 02/21/2023, 12:15 PM   Clinical Narrative:   Pt discharging to [facility].  RN call report to [phone].    Final next level of care: Skilled Nursing Facility Barriers to Discharge: Barriers Resolved   Patient Goals and CMS Choice CMS Medicare.gov Compare Post Acute Care list provided to:: Patient Represenative (must comment) (daughter [name]) Choice offered to / list presented to : Adult Children  Discharge Placement                Patient [facility] bed at: [facility] Patient to be transferred to facility by: [place] Name of family member notified: daughter [name] Patient and family notified of of transfer: 02/21/23  Discharge Plan and Services Additional resources added to the After Visit Summary for   In-house Referral: Clinical Social Work   Post Acute Care Choice: [place]                               Social Determinants of Health ([place]) Interventions SDOH Screenings   Food Insecurity: Unknown (02/18/2023)  Housing: Low Risk  (02/18/2023)  Transportation Needs: No Transportation Needs (02/18/2023)  Utilities: Not At Risk (02/18/2023)  Depression (PHQ2-9): High Risk (02/12/2023)  Financial Resource Strain: Low Risk  (08/18/2021)  Physical Activity: Unknown (08/08/2018)  Social Connections: Unknown (08/18/2021)  Stress: No Stress Concern Present (08/18/2021)  Tobacco Use: Low Risk  (02/21/2023)     Readmission Risk Interventions     No data to display

## 2023-02-21 NOTE — Discharge Instructions (Signed)
Orthopaedic Trauma Service Discharge Instructions   General Discharge Instructions  Orthopaedic Injuries:  Right hip fracture treated with intramedullary nailing  WEIGHT BEARING STATUS: weightbearing as tolerated Right leg with assistance   RANGE OF MOTION/ACTIVITY: Range of motion as tolerated right hip and knee  Bone health: Continue to optimize nutrition.  Vitamin D labs look good on admission.  However fracture is suggestive of osteoporosis.  We can discuss further intervention at postoperative follow-up  Review the following resource for additional information regarding bone health  [email or web address]  Wound Care: Daily wound care as needed starting now.  Can use 4 x 4 gauze and tape or silicone foam such as a Mepilex.  Okay to shower and clean wounds with soap and water only.  Apply ointments, lotions or solutions.  Use soap and water only to clean.  Can leave open to the air once there is no drainage  DVT/PE prophylaxis: lovenox 40 mg sq injection daily x 21 days   Diet: as you were eating previously.  Can use over the counter stool softeners and bowel preparations, such as Miralax, to help with bowel movements.  Narcotics can be constipating.  Be sure to drink plenty of fluids  PAIN MEDICATION USE AND EXPECTATIONS  You have likely been given narcotic medications to help control your pain.  After a traumatic event that results in an fracture (broken bone) with or without surgery, it is ok to use narcotic pain medications to help control one's pain.  We understand that everyone responds to pain differently and each individual patient will be evaluated on a regular basis for the continued need for narcotic medications. Ideally, narcotic medication use should last no more than 6-8 weeks (coinciding with fracture healing).   As a patient it is your responsibility as well to monitor narcotic medication use and report the amount and frequency you use these  medications when you come to your office visit.   We would also advise that if you are using narcotic medications, you should take a dose prior to therapy to maximize you participation.  IF YOU ARE ON NARCOTIC MEDICATIONS IT IS NOT PERMISSIBLE TO OPERATE A MOTOR VEHICLE (MOTORCYCLE/CAR/TRUCK/MOPED) OR HEAVY MACHINERY DO NOT MIX NARCOTICS WITH OTHER CNS (CENTRAL NERVOUS SYSTEM) DEPRESSANTS SUCH AS ALCOHOL   POST-OPERATIVE OPIOID TAPER INSTRUCTIONS: It is important to wean off of your opioid medication as soon as possible. If you do not need pain medication after your surgery it is ok to stop day one. Opioids include: Codeine, Hydrocodone(Norco, Vicodin), Oxycodone(Percocet, oxycontin) and hydromorphone amongst others.  Long term and even short term use of opiods can cause: Increased pain response Dependence Constipation Depression Respiratory depression And more.  Withdrawal symptoms can include Flu like symptoms Nausea, vomiting And more Techniques to manage these symptoms Hydrate well Eat regular healthy meals Stay active Use relaxation techniques(deep breathing, meditating, yoga) Do Not substitute Alcohol to help with tapering If you have been on opioids for less than two weeks and do not have pain than it is ok to stop all together.  Plan to wean off of opioids This plan should start within one week post op of your fracture surgery  Maintain the same interval or time between taking each dose and first decrease the dose.  Cut the total daily intake of opioids by one tablet each day Next start to increase the time between doses. The last dose that should be eliminated is the evening dose.    STOP SMOKING OR USING NICOTINE PRODUCTS!!!!  As discussed nicotine severely impairs your body's ability to heal surgical and traumatic wounds but also impairs bone healing.  Wounds and bone heal by forming microscopic blood vessels (angiogenesis) and nicotine is a vasoconstrictor  (essentially, shrinks blood vessels).  Therefore, if vasoconstriction occurs to these microscopic blood vessels they essentially disappear and are unable to deliver necessary nutrients to the healing tissue.  This is one modifiable factor that you can do to dramatically increase your chances of healing your injury.    (This means no smoking, no nicotine gum, patches, etc)  DO NOT USE NONSTEROIDAL ANTI-INFLAMMATORY DRUGS (NSAID'S)  Using products such as Advil (ibuprofen), Aleve (naproxen), Motrin (ibuprofen) for additional pain control during fracture healing can delay and/or prevent the healing response.  If you would like to take over the counter (OTC) medication, Tylenol (acetaminophen) is ok.  However, some narcotic medications that are given for pain control contain acetaminophen as well. Therefore, you should not exceed more than 4000 mg of tylenol in a day if you do not have liver disease.  Also note that there are may OTC medicines, such as cold medicines and allergy medicines that my contain tylenol as well.  If you have any questions about medications and/or interactions please ask your doctor/PA or your pharmacist.      ICE AND ELEVATE INJURED/OPERATIVE EXTREMITY  Using ice and elevating the injured extremity above your heart can help with swelling and pain control.  Icing in a pulsatile fashion, such as 20 minutes on and 20 minutes off, can be followed.    Do not place ice directly on skin. Make sure there is a barrier between to skin and the ice pack.    Using frozen items such as frozen peas works well as the conform nicely to the are that needs to be iced.  USE AN ACE WRAP OR TED HOSE FOR SWELLING CONTROL  In addition to icing and elevation, Ace wraps or TED hose are used to help limit and resolve swelling.  It is recommended to use Ace wraps or TED hose until you are informed to stop.    When using Ace Wraps start the wrapping distally (farthest away from the body) and wrap proximally  (closer to the body)   Example: If you had surgery on your leg or thing and you do not have a splint on, start the ace wrap at the toes and work your way up to the thigh        If you had surgery on your upper extremity and do not have a splint on, start the ace wrap at your fingers and work your way up to the upper arm  IF YOU ARE IN A SPLINT OR CAST DO NOT [place]   If your splint gets wet for any reason please contact the office immediately. You may shower in your splint or cast as long as you keep it dry.  This can be done by wrapping in a cast cover or garbage back (or similar)  Do Not stick any thing down your splint or cast such as pencils, money, or hangers to try and scratch yourself with.  If you feel itchy take benadryl as prescribed on the bottle for itching  IF YOU ARE IN A CAM BOOT (BLACK BOOT)  You may remove boot periodically. Perform daily dressing changes as noted below.  Wash the liner of the boot regularly and wear a sock when wearing the boot. It is recommended that you  sleep in the boot until told otherwise    Call office for the following: Temperature greater than 101F Persistent nausea and vomiting Severe uncontrolled pain Redness, tenderness, or signs of infection (pain, swelling, redness, odor or green/yellow discharge around the site) Difficulty breathing, headache or visual disturbances Hives Persistent dizziness or light-headedness Extreme fatigue Any other questions or concerns you may have after discharge  In an emergency, call 911 or go to an Emergency Department at a nearby hospital  HELPFUL INFORMATION  If you had a block, it will wear off between 8-24 hrs postop typically.  This is period when your pain may go from nearly zero to the pain you would have had postop without the block.  This is an abrupt transition but nothing dangerous is happening.  You may take an extra dose of narcotic when this happens.  You should wean off your  narcotic medicines as soon as you are able.  Most patients will be off or using minimal narcotics before their first postop appointment.   We suggest you use the pain medication the first night prior to going to bed, in order to ease any pain when the anesthesia wears off. You should avoid taking pain medications on an empty stomach as it will make you nauseous.  Do not drink alcoholic beverages or take illicit drugs when taking pain medications.  In most states it is against the law to drive while you are in a splint or sling.  And certainly against the law to drive while taking narcotics.  You may return to work/school in the next couple of days when you feel up to it.   Pain medication may make you constipated.  Below are a few solutions to try in this order: Decrease the amount of pain medication if you aren't having pain. Drink lots of decaffeinated fluids. Drink prune juice and/or each dried prunes  If the first 3 don't work start with additional solutions Take Colace - an over-the-counter stool softener Take Senokot - an over-the-counter laxative Take Miralax - a stronger over-the-counter laxative     CALL THE OFFICE WITH ANY QUESTIONS OR CONCERNS: [phone]   VISIT OUR WEBSITE FOR ADDITIONAL INFORMATION: [email or web address]

## 2023-02-21 NOTE — Plan of Care (Signed)
  Problem: Education: Goal: Knowledge of General Education information will improve Description: Including pain rating scale, medication(s)/side effects and non-pharmacologic comfort measures Outcome: Progressing   Problem: Clinical Measurements: Goal: Respiratory complications will improve Outcome: Progressing   Problem: Clinical Measurements: Goal: Cardiovascular complication will be avoided Outcome: Progressing   Problem: Nutrition: Goal: Adequate nutrition will be maintained Outcome: Progressing   Problem: Coping: Goal: Level of anxiety will decrease Outcome: Progressing   Problem: Elimination: Goal: Will not experience complications related to bowel motility Outcome: Progressing Goal: Will not experience complications related to urinary retention Outcome: Progressing   Problem: Safety: Goal: Ability to remain free from injury will improve Outcome: Progressing   Problem: Skin Integrity: Goal: Risk for impaired skin integrity will decrease Outcome: Progressing

## 2023-02-21 NOTE — Progress Notes (Signed)
Mobility Specialist Progress Note   02/21/23 1100  Mobility  Activity Transferred from chair to bed  Level of Assistance +2 (takes two people) (Max A)  Assistive Device WPS Resources of Motion/Exercises Active;All extremities  RLE Weight Bearing WBAT  Activity Response Tolerated poorly;RN notified   Patient received in recliner chair. Presented alert and oriented to self only. Displayed some distress with oxygen saturating mid 70's and pulse rate fluctuating between 56 and peaking at 211. Stated she felt bad and had a headache. Agreed to assist patient back to bed. On first standing trial, patient not complying with verbal and multimodal cueing. PT provided additional assistance to get patient back to bed. Required +2 max A to stand onto [place]; mod A + verbal cues stand>sit; max A +2 for bed mobility from sit>supine. Upon returning to supine oxygen saturation lingered low-mid 80's. RN was notified. Was left with RN present to resume care with all needs met.  Tasha Avila, BS EXP Mobility Specialist Please contact via SecureChat or Rehab office at [phone]

## 2023-02-22 ENCOUNTER — Emergency Department (HOSPITAL_COMMUNITY)
Admission: EM | Admit: 2023-02-22 | Discharge: 2023-03-05 | Disposition: E | Payer: Medicare Other | Attending: Emergency Medicine | Admitting: Emergency Medicine

## 2023-02-22 DIAGNOSIS — I10 Essential (primary) hypertension: Secondary | ICD-10-CM | POA: Diagnosis not present

## 2023-02-22 DIAGNOSIS — S8991XA Unspecified injury of right lower leg, initial encounter: Secondary | ICD-10-CM | POA: Diagnosis present

## 2023-02-22 DIAGNOSIS — M6281 Muscle weakness (generalized): Secondary | ICD-10-CM | POA: Diagnosis not present

## 2023-02-22 DIAGNOSIS — Z79899 Other long term (current) drug therapy: Secondary | ICD-10-CM | POA: Insufficient documentation

## 2023-02-22 DIAGNOSIS — X58XXXA Exposure to other specified factors, initial encounter: Secondary | ICD-10-CM | POA: Insufficient documentation

## 2023-02-22 DIAGNOSIS — S8011XA Contusion of right lower leg, initial encounter: Secondary | ICD-10-CM | POA: Insufficient documentation

## 2023-02-22 DIAGNOSIS — R41841 Cognitive communication deficit: Secondary | ICD-10-CM | POA: Diagnosis not present

## 2023-02-22 DIAGNOSIS — R2681 Unsteadiness on feet: Secondary | ICD-10-CM | POA: Diagnosis not present

## 2023-02-22 DIAGNOSIS — R0789 Other chest pain: Secondary | ICD-10-CM | POA: Diagnosis not present

## 2023-02-22 DIAGNOSIS — R079 Chest pain, unspecified: Secondary | ICD-10-CM

## 2023-02-22 DIAGNOSIS — S72141D Displaced intertrochanteric fracture of right femur, subsequent encounter for closed fracture with routine healing: Secondary | ICD-10-CM | POA: Diagnosis not present

## 2023-02-22 DIAGNOSIS — I469 Cardiac arrest, cause unspecified: Secondary | ICD-10-CM | POA: Insufficient documentation

## 2023-02-22 DIAGNOSIS — F0390 Unspecified dementia without behavioral disturbance: Secondary | ICD-10-CM | POA: Diagnosis not present

## 2023-02-22 DIAGNOSIS — Z9181 History of falling: Secondary | ICD-10-CM | POA: Diagnosis not present

## 2023-02-26 ENCOUNTER — Telehealth: Payer: Self-pay

## 2023-02-26 LAB — GLUCOSE, CAPILLARY
Glucose-Capillary: 93 mg/dL (ref 70–99)
Glucose-Capillary: 153 mg/dL — ABNORMAL HIGH (ref 70–99)

## 2023-02-26 NOTE — Telephone Encounter (Signed)
[facility] called to let us know that patient passed away 03/10/23 at [facility] and your name was listed on death certificate to complete. Death certificate should be available in database for you to complete. Time of death per funeral home was 03-10-23 at 4:36 PM.

## 2023-02-27 NOTE — Telephone Encounter (Signed)
Death certificate completed.

## 2023-03-05 NOTE — ED Provider Notes (Signed)
Tasha Avila Provider Note   CSN: [id number] Arrival date & time: 2023-03-18  1652     History  Chief Complaint  Patient presents with   Chest Pain    Tasha Avila is a 89 y.o. female.  89 year old female with a history of dementia, hypertension, hyperlipidemia, and non-Hodgkin's lymphoma who presented to the emergency department after expiring in EMS custody.  Per EMS reports, earlier today she was at [facility] and started complaining of chest pain.  When they arrived she was given 324 of aspirin and started having vomiting in the ambulance.  She was DNR/DNI and started to become bradycardic.  EMS initiated pacing but patient then went pulseless in PEA which decompensated to ventricular fibrillation and asystole.  They called time of death at 60.       Home Medications Prior to Admission medications   Medication Sig Start Date End Date Taking? Authorizing Provider  acetaminophen (TYLENOL) 500 MG tablet Take 500 mg by mouth every 6 (six) hours as needed.    [provider]  Cholecalciferol (VITAMIN D3) 125 MCG (5000 UT) capsule Take 1 capsule (5,000 Units total) by mouth daily. 01/31/23   Einar Pheasant, MD  cyanocobalamin 500 MCG TABS Take 500 mcg by mouth daily. 01/31/23   Einar Pheasant, MD  DULoxetine (CYMBALTA) 60 MG capsule Take 1 capsule (60 mg total) by mouth daily. 10/13/22   Einar Pheasant, MD  enoxaparin (LOVENOX) 40 MG/0.4ML injection Inject 0.4 mLs (40 mg total) into the skin daily for 21 days. 02/21/23 03/14/23  Ainsley Spinner, PA-C  Homeopathic Products ([employer] LEG CRAMPS PAIN RELIEF PO) Take by mouth as needed.    [provider]  levothyroxine (SYNTHROID) 88 MCG tablet Take 1 tablet by mouth once daily 12/05/22   Einar Pheasant, MD  lisinopril (ZESTRIL) 40 MG tablet Take 1 tablet (40 mg total) by mouth daily. 10/13/22   Einar Pheasant, MD  oxyCODONE (OXY IR/ROXICODONE) 5 MG immediate release tablet Take  1 tablet (5 mg total) by mouth every 8 (eight) hours as needed for severe pain. 02/21/23   Ainsley Spinner, PA-C  pantoprazole (PROTONIX) 40 MG tablet Take 1 tablet (40 mg total) by mouth daily. 10/13/22   Einar Pheasant, MD  Propylene Glycol (SYSTANE BALANCE OP) Apply 1 drop to eye as needed.    [provider]  psyllium (METAMUCIL) 58.6 % packet Take 1 packet by mouth daily.    [provider]  rosuvastatin (CRESTOR) 5 MG tablet TAKE 1 TABLET BY MOUTH ON Adelfa Koh, [facility] AND FRIDAY 12/05/22   Einar Pheasant, MD  SALINE NASAL SPRAY NA Place into the nose as needed.    [provider]      Allergies    Patient has no known allergies.    Review of Systems   Review of Systems  Physical Exam Updated Vital Signs Ht 5\' 2"  (1.575 m)   Wt 70.9 kg   BMI 28.61 kg/m  Physical Exam Vitals and nursing note reviewed.  Constitutional:      Appearance: She is well-developed.     Comments: Patient unresponsive.  Deceased.  HENT:     Head: Normocephalic and atraumatic.     Right Ear: External ear normal.     Left Ear: External ear normal.     Nose: Nose normal.  Eyes:     Comments: Pupils 5 mm and fixed.  Cardiovascular:     Comments: Absent heart sounds and carotid pulse Pulmonary:  Comments: No chest rise or breathing noted. Abdominal:     General: There is distension.  Musculoskeletal:     Right lower leg: No edema.     Left lower leg: No edema.     Comments: Bruising of right lower extremity and medial lateral aspect  Skin:    Comments: Cold and mottled skin     ED Results / Procedures / Treatments   Labs (all labs ordered are listed, but only abnormal results are displayed) Labs Reviewed - No data to display  EKG None  Radiology DG CHEST PORT 1 VIEW  Result Date: 02/21/2023 CLINICAL DATA:  Acute respiratory distress. Recent intertrochanteric femur fracture. EXAM: PORTABLE CHEST 1 VIEW COMPARISON:  02/18/2023; 09/27/2021; abdominal CT-09/27/2021  FINDINGS: Unchanged cardiac silhouette and mediastinal contours with tortuosity and potential at least ectasia of the aortic arch with rightward deviation of the tracheal air column at the level of the thoracic inlet. Minimal bilateral infrahilar heterogeneous opacities favored to represent atelectasis. No discrete focal airspace opacities. No pleural effusion or pneumothorax. No evidence of edema. No acute osseous abnormalities. The right humeral head remains high-riding in relation to the undersurface of the glenoid, as could be seen in the setting of rotator cuff pathology. IMPRESSION: 1. Bibasilar atelectasis without superimposed acute cardiopulmonary disease. 2. Tortuosity and potential ectasia of the aortic arch with rightward deviation of the tracheal air column at the level of the thoracic inlet, potentially accentuated due to positioning, hypoventilation and portable technique. Further evaluation with dedicated chest CT could be performed as indicated. Electronically Signed   By: Sandi Mariscal M.D.   On: 02/21/2023 13:05    Procedures Procedures   Medications Ordered in ED Medications - No data to display  ED Course/ Medical Decision Making/ A&P                             Medical Decision Making  Tasha Avila is a 89 y.o. female with comorbidities that complicate the patient evaluation including dementia, hypertension, hyperlipidemia, and non-Hodgkin's lymphoma who presented to the emergency department after expiring in EMS custody.    Initial Ddx:  Arrhythmia, MI, PE, electrolyte abnormality, Boerhaave's  MDM:  The patient's chest pain could have had any of the above diagnoses.  Does appear that patient developed from bradycardia to pulseless arrest and eventually VF.  Her wishes were respected with regards to her DNR/DNI.  Patient arrived and was deceased.  No signs of head trauma and no reported other trauma or drugs do not feel that her case needs to be referred to the medical  examiner.  Plan:  Notify family  ED Summary/Re-evaluation:  Patient's daughter notified at the bedside.  Patient will be taken to morgue after.  This patient presents to the ED for concern of complaints listed in HPI, this involves an extensive number of treatment options, and is a complaint that carries with it a high risk of complications and morbidity. Disposition including potential need for admission considered.   Dispo: Deceased  Additional history obtained from EMS Records reviewed Outpatient Clinic Notes  Final Clinical Impression(s) / ED Diagnoses Final diagnoses:  Chest pain, unspecified type  Cardiac arrest [facility])    Rx / DC Orders ED Discharge Orders     None         Fransico Meadow, MD 02/23/23 [phone]

## 2023-03-05 NOTE — ED Notes (Signed)
Pt transported to morgue 

## 2023-03-05 NOTE — ED Triage Notes (Signed)
Pt BIB EMS from [facility], c/o chest pain for the last day. Given 324 of ASA by EMS> In ambulance pt had one episode of vomiting and brady down, EMS started pacing patient but captured PEA, Vtach, VFIb, then asystole. Pt has DNR gold form present. Time of death 68.

## 2023-03-05 NOTE — ED Notes (Signed)
Report called and given to Allyne Gee LPN at [facility]

## 2023-03-05 DEATH — deceased
